# Patient Record
Sex: Male | Born: 1950 | Race: White | Hispanic: No | Smoking: Current every day smoker
Health system: Southern US, Community
[De-identification: ages and names within clinical notes are randomized; demographics above are authoritative.]

## PROBLEM LIST (undated history)

## (undated) DIAGNOSIS — Z5900 Homelessness unspecified: Secondary | ICD-10-CM

## (undated) DIAGNOSIS — I639 Cerebral infarction, unspecified: Secondary | ICD-10-CM

## (undated) DIAGNOSIS — R569 Unspecified convulsions: Secondary | ICD-10-CM

## (undated) DIAGNOSIS — F329 Major depressive disorder, single episode, unspecified: Secondary | ICD-10-CM

## (undated) DIAGNOSIS — F1010 Alcohol abuse, uncomplicated: Secondary | ICD-10-CM

## (undated) DIAGNOSIS — I10 Essential (primary) hypertension: Secondary | ICD-10-CM

## (undated) DIAGNOSIS — F32A Depression, unspecified: Secondary | ICD-10-CM

## (undated) DIAGNOSIS — E7800 Pure hypercholesterolemia, unspecified: Secondary | ICD-10-CM

---

## 2000-01-19 ENCOUNTER — Emergency Department (HOSPITAL_COMMUNITY): Admission: EM | Admit: 2000-01-19 | Discharge: 2000-01-19 | Payer: Self-pay | Admitting: Emergency Medicine

## 2001-10-17 ENCOUNTER — Encounter: Payer: Self-pay | Admitting: Family Medicine

## 2001-10-17 ENCOUNTER — Encounter: Admission: RE | Admit: 2001-10-17 | Discharge: 2001-10-17 | Payer: Self-pay | Admitting: Family Medicine

## 2004-04-08 ENCOUNTER — Inpatient Hospital Stay (HOSPITAL_COMMUNITY): Admission: EM | Admit: 2004-04-08 | Discharge: 2004-04-12 | Payer: Self-pay | Admitting: Emergency Medicine

## 2004-04-15 ENCOUNTER — Emergency Department (HOSPITAL_COMMUNITY): Admission: EM | Admit: 2004-04-15 | Discharge: 2004-04-15 | Payer: Self-pay | Admitting: Emergency Medicine

## 2006-04-16 ENCOUNTER — Emergency Department (HOSPITAL_COMMUNITY): Admission: EM | Admit: 2006-04-16 | Discharge: 2006-04-16 | Payer: Self-pay | Admitting: Family Medicine

## 2006-06-16 ENCOUNTER — Emergency Department (HOSPITAL_COMMUNITY): Admission: EM | Admit: 2006-06-16 | Discharge: 2006-06-16 | Payer: Self-pay | Admitting: Emergency Medicine

## 2008-07-05 ENCOUNTER — Ambulatory Visit: Payer: Self-pay | Admitting: Internal Medicine

## 2008-07-07 ENCOUNTER — Ambulatory Visit: Payer: Self-pay | Admitting: *Deleted

## 2008-07-15 ENCOUNTER — Encounter: Payer: Self-pay | Admitting: Internal Medicine

## 2008-07-15 ENCOUNTER — Ambulatory Visit: Payer: Self-pay | Admitting: Internal Medicine

## 2008-07-15 DIAGNOSIS — J019 Acute sinusitis, unspecified: Secondary | ICD-10-CM | POA: Insufficient documentation

## 2008-08-11 ENCOUNTER — Emergency Department (HOSPITAL_COMMUNITY): Admission: EM | Admit: 2008-08-11 | Discharge: 2008-08-12 | Payer: Self-pay | Admitting: Emergency Medicine

## 2008-11-19 ENCOUNTER — Telehealth (INDEPENDENT_AMBULATORY_CARE_PROVIDER_SITE_OTHER): Payer: Self-pay | Admitting: *Deleted

## 2008-11-19 ENCOUNTER — Encounter (INDEPENDENT_AMBULATORY_CARE_PROVIDER_SITE_OTHER): Payer: Self-pay | Admitting: Adult Health

## 2008-11-19 ENCOUNTER — Ambulatory Visit: Payer: Self-pay | Admitting: Internal Medicine

## 2008-11-19 LAB — CONVERTED CEMR LAB
ALT: 10 U/L
AST: 11 U/L
Albumin: 4.3 g/dL
Alkaline Phosphatase: 69 U/L
BUN: 11 mg/dL
Basophils Absolute: 0 K/uL
Basophils Relative: 0 %
CO2: 26 meq/L
Calcium: 8.8 mg/dL
Chloride: 104 meq/L
Creatinine, Ser: 0.88 mg/dL
Eosinophils Absolute: 0.3 K/uL
Eosinophils Relative: 6 % — ABNORMAL HIGH
Glucose, Bld: 86 mg/dL
HCT: 41.2 %
Hemoglobin: 13.5 g/dL
Lymphocytes Relative: 31 %
Lymphs Abs: 1.7 K/uL
MCHC: 32.8 g/dL
MCV: 96.3 fL
Monocytes Absolute: 0.5 K/uL
Monocytes Relative: 9 %
Neutro Abs: 3 K/uL
Neutrophils Relative %: 54 %
Platelets: 260 K/uL
Potassium: 4.4 meq/L
RBC: 4.28 M/uL
RDW: 14.4 %
Sodium: 142 meq/L
Total Bilirubin: 0.2 mg/dL — ABNORMAL LOW
Total Protein: 7 g/dL
WBC: 5.5 10*3/microliter

## 2008-11-20 ENCOUNTER — Encounter (INDEPENDENT_AMBULATORY_CARE_PROVIDER_SITE_OTHER): Payer: Self-pay | Admitting: Adult Health

## 2008-11-26 ENCOUNTER — Ambulatory Visit: Payer: Self-pay | Admitting: Internal Medicine

## 2008-11-30 ENCOUNTER — Ambulatory Visit: Payer: Self-pay | Admitting: Internal Medicine

## 2009-10-15 ENCOUNTER — Emergency Department (HOSPITAL_COMMUNITY): Admission: EM | Admit: 2009-10-15 | Discharge: 2009-10-15 | Payer: Self-pay | Admitting: Emergency Medicine

## 2009-12-30 ENCOUNTER — Ambulatory Visit: Payer: Self-pay | Admitting: Internal Medicine

## 2009-12-30 ENCOUNTER — Inpatient Hospital Stay (HOSPITAL_COMMUNITY): Admission: EM | Admit: 2009-12-30 | Discharge: 2010-01-09 | Payer: Self-pay | Admitting: Emergency Medicine

## 2010-01-06 ENCOUNTER — Encounter: Payer: Self-pay | Admitting: Internal Medicine

## 2010-01-09 DIAGNOSIS — I10 Essential (primary) hypertension: Secondary | ICD-10-CM | POA: Insufficient documentation

## 2010-01-09 DIAGNOSIS — I6350 Cerebral infarction due to unspecified occlusion or stenosis of unspecified cerebral artery: Secondary | ICD-10-CM | POA: Insufficient documentation

## 2010-02-01 ENCOUNTER — Encounter (INDEPENDENT_AMBULATORY_CARE_PROVIDER_SITE_OTHER): Payer: Self-pay | Admitting: *Deleted

## 2010-02-01 LAB — CONVERTED CEMR LAB
AST: 15 units/L (ref 0–37)
Alkaline Phosphatase: 70 units/L (ref 39–117)
BUN: 13 mg/dL (ref 6–23)
Creatinine, Ser: 0.82 mg/dL (ref 0.40–1.50)

## 2010-04-13 ENCOUNTER — Emergency Department (HOSPITAL_COMMUNITY)
Admission: EM | Admit: 2010-04-13 | Discharge: 2010-04-14 | Payer: Self-pay | Source: Home / Self Care | Admitting: Emergency Medicine

## 2010-04-13 LAB — CBC
MCV: 96.2 fL (ref 78.0–100.0)
Platelets: 246 10*3/uL (ref 150–400)
RDW: 13.1 % (ref 11.5–15.5)

## 2010-04-13 LAB — BASIC METABOLIC PANEL
Calcium: 8.6 mg/dL (ref 8.4–10.5)
GFR calc non Af Amer: >60 mL/min (ref >60)
Glucose, Bld: 108 mg/dL — ABNORMAL HIGH (ref 70–99)
Sodium: 140 mEq/L (ref 135–145)

## 2010-04-13 LAB — DIFFERENTIAL
Lymphocytes Relative: 18 % (ref 12–46)
Monocytes Absolute: 0.5 10*3/uL (ref 0.1–1.0)
Monocytes Relative: 5 % (ref 3–12)
Neutro Abs: 7.2 10*3/uL (ref 1.7–7.7)
Neutrophils Relative %: 75 % (ref 43–77)

## 2010-04-13 LAB — ETHANOL: Alcohol, Ethyl (B): 250 mg/dL — ABNORMAL HIGH (ref 0–10)

## 2010-04-18 NOTE — Discharge Summary (Signed)
Summary: Hospital Discharge Update    Hospital Discharge Update:  Date of Admission: 12/30/2009 Date of Discharge: 01/09/2010  Brief Summary:  Mr. [name] [name] is a 60 year old gentleman that was admitted for symptoms of right sided numbness, along with difficulty walking. Patient was found to have a left thalamic stroke per MRI report. MRI report reads as follows: Acute lacunar infarct in the left cerebral white matter tracking through the lateral thalamus near the posterior limb of the left internal capsule.  No mass effect or hemorrhage.Underlying chronic small vessel ischemia. Patient was risk stratified with Plavix, Zocor for lipid control, and Norvasc for HTN. Patient was offered Aspirin as an alternative to Plavix, but he declined stating Plavix is superior to Aspirin. Physical therapy was also evaluating patient throughout hospitalization. He has residual fine motor skill weakness, but has shown significant improvement since admission.  Other follow-up issues:  Financial concerns with medication assistance. Patient was provided two coupons for Plavix, which will give him 28 day supply. Patient would like Norvasc for blood pressure and understands that it is not on the 4-dollar formulary.  Problem list changes:  Added new problem of CVA (ICD-434.91) Added new problem of INADEQUATE MATERIAL RESOURCES (ICD-V60.2) Added new problem of HYPERTENSION (ICD-401.9)  Medication list changes:  Removed medication of AUGMENTIN 875-125 MG TABS (AMOXICILLIN-POT CLAVULANATE) Take 1 tablet by mouth two times a day Added new medication of AMLODIPINE BESYLATE 5 MG TABS (AMLODIPINE BESYLATE) Take 1 tablet by mouth once a day - Signed Added new medication of PLAVIX 75 MG TABS (CLOPIDOGREL BISULFATE) Take 1 tablet by mouth once a day - Signed Added new medication of SIMVASTATIN 5 MG TABS (SIMVASTATIN) Take 1 tablet by mouth once a day - Signed Rx of AMLODIPINE BESYLATE 5 MG TABS (AMLODIPINE BESYLATE) Take 1  tablet by mouth once a day;  #3 x 0;  Signed;  Entered by: Melida Quitter MD;  Authorized by: Leodis Sias MD;  Method used: Print then Give to Patient Rx of PLAVIX 75 MG TABS (CLOPIDOGREL BISULFATE) Take 1 tablet by mouth once a day;  #14 x 0;  Signed;  Entered by: Melida Quitter MD;  Authorized by: Leodis Sias MD;  Method used: Print then Give to Patient Rx of SIMVASTATIN 5 MG TABS (SIMVASTATIN) Take 1 tablet by mouth once a day;  #3 x 0;  Signed;  Entered by: Melida Quitter MD;  Authorized by: Leodis Sias MD;  Method used: Print then Give to Patient Rx of AMLODIPINE BESYLATE 5 MG TABS (AMLODIPINE BESYLATE) Take 1 tablet by mouth once a day;  #31 x 3;  Signed;  Entered by: Melida Quitter MD;  Authorized by: Leodis Sias MD;  Method used: Print then Give to Patient Rx of PLAVIX 75 MG TABS (CLOPIDOGREL BISULFATE) Take 1 tablet by mouth once a day;  #14 x 0;  Signed;  Entered by: Melida Quitter MD;  Authorized by: Leodis Sias MD;  Method used: Print then Give to Patient Rx of SIMVASTATIN 5 MG TABS (SIMVASTATIN) Take 1 tablet by mouth once a day;  #31 x 3;  Signed;  Entered by: Melida Quitter MD;  Authorized by: Leodis Sias MD;  Method used: Print then Give to Patient Rx of PLAVIX 75 MG TABS (CLOPIDOGREL BISULFATE) Take 1 tablet by mouth once a day;  #31 x 3;  Signed;  Entered by: Melida Quitter MD;  Authorized by: Leodis Sias MD;  Method used: Print then Give to Patient  The medication, problem, and allergy lists have  been updated.  Please see the dictated discharge summary for details.  Discharge medications:  AMLODIPINE BESYLATE 5 MG TABS (AMLODIPINE BESYLATE) Take 1 tablet by mouth once a day PLAVIX 75 MG TABS (CLOPIDOGREL BISULFATE) Take 1 tablet by mouth once a day SIMVASTATIN 5 MG TABS (SIMVASTATIN) Take 1 tablet by mouth once a day  Other patient instructions:  Please follow up with [facility] on November 16th, 2011 at 8 am. Please [facility] sooner to get the orange card set-up.  Please take all your medications as directed. You have been started on three new medications. Plavix is for further prevention of stroke, Norvasc (Amlodipine) is for blood pressure and Zocor (Simvastatin) is for cholesterol.  Please continue to follow a low sodium diet, which means avoid fried foods, and eat more vegetables and fruit as snacks.  If you have any further questions, please contact the clinic.   Note: Hospital Discharge Medications & Other Instructions handout was printed, one copy for patient and a second copy to be placed in hospital chart.    Complete Medication List: 1)  Amlodipine Besylate 5 Mg Tabs (Amlodipine besylate) .... Take 1 tablet by mouth once a day 2)  Plavix 75 Mg Tabs (Clopidogrel bisulfate) .... Take 1 tablet by mouth once a day 3)  Simvastatin 5 Mg Tabs (Simvastatin) .... Take 1 tablet by mouth once a day  Prescriptions: PLAVIX 75 MG TABS (CLOPIDOGREL BISULFATE) Take 1 tablet by mouth once a day  #31 x 3   Entered by:   Melida Quitter MD   Authorized by:   Leodis Sias MD   Signed by:   Melida Quitter MD on 01/09/2010   Method used:   Print then Give to Patient   RxID:   [id number] SIMVASTATIN 5 MG TABS (SIMVASTATIN) Take 1 tablet by mouth once a day  #31 x 3   Entered by:   Melida Quitter MD   Authorized by:   Leodis Sias MD   Signed by:   Melida Quitter MD on 01/09/2010   Method used:   Print then Give to Patient   RxID:   [id number] PLAVIX 75 MG TABS (CLOPIDOGREL BISULFATE) Take 1 tablet by mouth once a day  #14 x 0   Entered by:   Melida Quitter MD   Authorized by:   Leodis Sias MD   Signed by:   Melida Quitter MD on 01/09/2010   Method used:   Print then Give to Patient   RxID:   [id number] AMLODIPINE BESYLATE 5 MG TABS (AMLODIPINE BESYLATE) Take 1 tablet by mouth once a day  #31 x 3   Entered by:   Melida Quitter MD   Authorized by:    Leodis Sias MD   Signed by:   Melida Quitter MD on 01/09/2010   Method used:   Print then Give to Patient   RxID:   [id number] SIMVASTATIN 5 MG TABS (SIMVASTATIN) Take 1 tablet by mouth once a day  #3 x 0   Entered by:   Melida Quitter MD   Authorized by:   Leodis Sias MD   Signed by:   Melida Quitter MD on 01/09/2010   Method used:   Print then Give to Patient   RxID:   [id number] PLAVIX 75 MG TABS (CLOPIDOGREL BISULFATE) Take 1 tablet by mouth once a day  #14 x 0   Entered by:   Melida Quitter MD   Authorized by:   Leodis Sias MD  Signed by:   Melida Quitter MD on 01/09/2010   Method used:   Print then Give to Patient   RxID:   [id number] AMLODIPINE BESYLATE 5 MG TABS (AMLODIPINE BESYLATE) Take 1 tablet by mouth once a day  #3 x 0   Entered by:   Melida Quitter MD   Authorized by:   Leodis Sias MD   Signed by:   Melida Quitter MD on 01/09/2010   Method used:   Print then Give to Patient   RxID:   [id number]

## 2010-04-20 ENCOUNTER — Emergency Department (HOSPITAL_COMMUNITY)
Admission: EM | Admit: 2010-04-20 | Discharge: 2010-04-20 | Disposition: A | Discharge status: Home / Self Care | Payer: Self-pay | Attending: Emergency Medicine | Admitting: Emergency Medicine

## 2010-04-20 DIAGNOSIS — I10 Essential (primary) hypertension: Secondary | ICD-10-CM | POA: Insufficient documentation

## 2010-04-20 DIAGNOSIS — Z4802 Encounter for removal of sutures: Secondary | ICD-10-CM | POA: Insufficient documentation

## 2010-04-20 DIAGNOSIS — Z8673 Personal history of transient ischemic attack (TIA), and cerebral infarction without residual deficits: Secondary | ICD-10-CM | POA: Insufficient documentation

## 2010-04-20 DIAGNOSIS — E785 Hyperlipidemia, unspecified: Secondary | ICD-10-CM | POA: Insufficient documentation

## 2010-04-24 ENCOUNTER — Emergency Department (HOSPITAL_COMMUNITY)
Admission: EM | Admit: 2010-04-24 | Discharge: 2010-04-24 | Discharge status: Against Medical Advice | Payer: Self-pay | Attending: Emergency Medicine | Admitting: Emergency Medicine

## 2010-05-02 ENCOUNTER — Other Ambulatory Visit: Payer: Self-pay | Admitting: Family Medicine

## 2010-05-02 ENCOUNTER — Ambulatory Visit (HOSPITAL_COMMUNITY)
Admission: RE | Admit: 2010-05-02 | Discharge: 2010-05-02 | Disposition: A | Discharge status: Home / Self Care | Payer: Self-pay | Source: Ambulatory Visit | Attending: Family Medicine | Admitting: Family Medicine

## 2010-05-02 DIAGNOSIS — M47812 Spondylosis without myelopathy or radiculopathy, cervical region: Secondary | ICD-10-CM | POA: Insufficient documentation

## 2010-05-02 DIAGNOSIS — M542 Cervicalgia: Secondary | ICD-10-CM | POA: Insufficient documentation

## 2010-05-02 DIAGNOSIS — R52 Pain, unspecified: Secondary | ICD-10-CM

## 2010-05-11 ENCOUNTER — Emergency Department (HOSPITAL_COMMUNITY): Payer: Self-pay

## 2010-05-11 ENCOUNTER — Emergency Department (HOSPITAL_COMMUNITY)
Admission: EM | Admit: 2010-05-11 | Discharge: 2010-05-11 | Disposition: A | Discharge status: Home / Self Care | Payer: Self-pay | Attending: Emergency Medicine | Admitting: Emergency Medicine

## 2010-05-11 DIAGNOSIS — T148XXA Other injury of unspecified body region, initial encounter: Secondary | ICD-10-CM | POA: Insufficient documentation

## 2010-05-11 DIAGNOSIS — Y9229 Other specified public building as the place of occurrence of the external cause: Secondary | ICD-10-CM | POA: Insufficient documentation

## 2010-05-11 DIAGNOSIS — G319 Degenerative disease of nervous system, unspecified: Secondary | ICD-10-CM | POA: Insufficient documentation

## 2010-05-11 DIAGNOSIS — I6789 Other cerebrovascular disease: Secondary | ICD-10-CM | POA: Insufficient documentation

## 2010-05-11 DIAGNOSIS — W19XXXA Unspecified fall, initial encounter: Secondary | ICD-10-CM | POA: Insufficient documentation

## 2010-05-11 DIAGNOSIS — M47812 Spondylosis without myelopathy or radiculopathy, cervical region: Secondary | ICD-10-CM | POA: Insufficient documentation

## 2010-05-31 LAB — CBC
HCT: 41.6 % (ref 39.0–52.0)
MCH: 34.1 pg — ABNORMAL HIGH (ref 26.0–34.0)
MCHC: 33.7 g/dL (ref 30.0–36.0)
MCV: 100 fL (ref 78.0–100.0)
MCV: 101.2 fL — ABNORMAL HIGH (ref 78.0–100.0)
Platelets: 221 10*3/uL (ref 150–400)
RBC: 4.34 MIL/uL (ref 4.22–5.81)
RDW: 13.8 % (ref 11.5–15.5)
WBC: 7.1 10*3/uL (ref 4.0–10.5)

## 2010-05-31 LAB — BASIC METABOLIC PANEL
BUN: 14 mg/dL (ref 6–23)
BUN: 15 mg/dL (ref 6–23)
CO2: 28 mEq/L (ref 19–32)
Calcium: 9.2 mg/dL (ref 8.4–10.5)
Calcium: 9.2 mg/dL (ref 8.4–10.5)
Chloride: 103 mEq/L (ref 96–112)
Chloride: 104 mEq/L (ref 96–112)
Creatinine, Ser: 0.95 mg/dL (ref 0.4–1.5)
GFR calc Af Amer: >60 mL/min (ref >60)
GFR calc Af Amer: >60 mL/min (ref >60)
GFR calc non Af Amer: >60 mL/min (ref >60)
GFR calc non Af Amer: >60 mL/min (ref >60)
Glucose, Bld: 91 mg/dL (ref 70–99)
Glucose, Bld: 92 mg/dL (ref 70–99)
Potassium: 3.8 mEq/L (ref 3.5–5.1)
Potassium: 4.2 mEq/L (ref 3.5–5.1)
Sodium: 141 mEq/L (ref 135–145)

## 2010-05-31 LAB — TSH: TSH: 2.467 u[IU]/mL (ref 0.350–4.500)

## 2010-05-31 LAB — LIPID PANEL
LDL Cholesterol: 90 mg/dL (ref 0–99)
Total CHOL/HDL Ratio: 3.3 RATIO
VLDL: 16 mg/dL (ref 0–40)

## 2010-06-01 LAB — COMPREHENSIVE METABOLIC PANEL
ALT: 11 U/L (ref 0–53)
AST: 15 U/L (ref 0–37)
Albumin: 3.5 g/dL (ref 3.5–5.2)
Alkaline Phosphatase: 54 U/L (ref 39–117)
GFR calc Af Amer: >60 mL/min (ref >60)
Glucose, Bld: 141 mg/dL — ABNORMAL HIGH (ref 70–99)
Potassium: 3.3 mEq/L — ABNORMAL LOW (ref 3.5–5.1)
Sodium: 140 mEq/L (ref 135–145)
Total Protein: 6.2 g/dL (ref 6.0–8.3)

## 2010-06-01 LAB — CBC
HCT: 42.5 % (ref 39.0–52.0)
Hemoglobin: 14.1 g/dL (ref 13.0–17.0)
MCV: 98.8 fL (ref 78.0–100.0)
RBC: 4.3 MIL/uL (ref 4.22–5.81)
WBC: 6.9 10*3/uL (ref 4.0–10.5)

## 2010-06-01 LAB — URINE DRUGS OF ABUSE SCREEN W ALC, ROUTINE (REF LAB)
Benzodiazepines.: NEGATIVE
Marijuana Metabolite: NEGATIVE
Methadone: NEGATIVE
Opiate Screen, Urine: NEGATIVE
Propoxyphene: NEGATIVE

## 2010-06-01 LAB — RAPID URINE DRUG SCREEN, HOSP PERFORMED
Amphetamines: NOT DETECTED
Barbiturates: NOT DETECTED
Benzodiazepines: NOT DETECTED
Opiates: NOT DETECTED

## 2010-06-01 LAB — DIFFERENTIAL
Eosinophils Relative: 3 % (ref 0–5)
Lymphocytes Relative: 16 % (ref 12–46)
Lymphs Abs: 1.1 10*3/uL (ref 0.7–4.0)
Monocytes Absolute: 0.5 10*3/uL (ref 0.1–1.0)

## 2010-06-01 LAB — BASIC METABOLIC PANEL
Chloride: 107 mEq/L (ref 96–112)
GFR calc Af Amer: >60 mL/min (ref >60)
Potassium: 4.3 mEq/L (ref 3.5–5.1)

## 2010-06-01 LAB — CARDIAC PANEL(CRET KIN+CKTOT+MB+TROPI): Total CK: 55 U/L (ref 7–232)

## 2010-06-01 LAB — PROTIME-INR: Prothrombin Time: 13 seconds (ref 11.6–15.2)

## 2010-06-01 LAB — HEMOGLOBIN A1C: Hgb A1c MFr Bld: 5.6 % (ref <5.7)

## 2010-06-03 LAB — POCT I-STAT, CHEM 8
BUN: 10 mg/dL (ref 6–23)
Calcium, Ion: 1.03 mmol/L — ABNORMAL LOW (ref 1.12–1.32)
Chloride: 105 mEq/L (ref 96–112)
Creatinine, Ser: 1.1 mg/dL (ref 0.4–1.5)
Glucose, Bld: 87 mg/dL (ref 70–99)
Potassium: 3.8 mEq/L (ref 3.5–5.1)

## 2010-08-04 NOTE — H&P (Signed)
NAMENATANEL, SNAVELY         ACCOUNT NO.:  [id number]   MEDICAL RECORD NO.:  [id number]          PATIENT TYPE:  EMS   LOCATION:  ED                           [facility]AC[facility]  [facility]   PHYSICIAN:  Hettie Holstein, D.O.    DATE OF BIRTH:  1950/12/13   DATE OF ADMISSION:  04/07/2004  DATE OF DISCHARGE:                                HISTORY & PHYSICAL   This is a 23-hour observation note.  Patient is unassigned.   CHIEF COMPLAINT:  Could not walk.  Left leg weakness.   HISTORY OF PRESENT ILLNESS:  This is a 60 year old alcoholic Caucasian male  who reports he is now homeless and presents with acute lower extremity  weakness.  Sustained a laceration to his left foot today while he fell.  In  the emergency department, he was found to be ataxic.  He reported a recent  fall several days ago and had been seen by [employer].  He feels  that his problems may be related to this.  He had been drinking today.  His  alcohol level was 294.  He drinks about a case a beer over the course of  three days.  He states that he is being evicted from the place he is  currently residing.  He lives with his sister and mother.  His living  situation seems to be pretty inconsistent, I am not certain; however, he  does deny being in a homeless shelter or depending on a homeless shelter in  the past.  He has in the past gone through [facility] in [facility], and his  sister-in-law has tried to encourage him, once again, to go into a detox  program.  He is currently considering this.  In any event, in the emergency  department he is noted to have a laceration on the plantar surface on his  left foot, fifth digit.  Currently, we are awaiting a wound/laceration  repair to be completed before transfer upstairs.   PAST MEDICAL HISTORY:  He said he sustained a fall several weeks ago and had  been seen at [facility] [facility].  He has no prior known diagnosis of  hypertension; however, he stated at that time,  the PA at [facility] told him that he should have his blood pressure rechecked and  see a regular physician.  He said that his blood pressure was relieved at  that time.  No other prior medical history except for tobacco dependence and  longstanding alcohol abuse.   He has no prior surgical history.   MEDICATIONS:  He takes no regular medications at home.   He has no known drug allergies.   SOCIAL HISTORY:  Patient used to commute to [facility].  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  In any event, he is an  alcoholic.  The longest period of time that he completely stopped drinking  was about four days in a row.  He denies a prior history of DT's or shakes.  He denies any other illegal drug use.  Currently, he states that he is being  evicted from his apartment.  He is a half-pack-per-day smoker for about 20-  30 years.  He completed high school and attended 1-1/2 years education at  [employer] but quit for unknown reasons.   FAMILY HISTORY:  Mother is alive at age 60.  Had an MI at age 64.  Father's  medical history:  His father died at age 15, had a CVA.  He has two brothers  and two sisters with positive diabetes in the family.   REVIEW OF SYSTEMS:  Patient denies any nausea, vomiting, diarrhea, shortness  of breath, chest pain, abdominal pain, dysuria, hematochezia, melena.  He is  unsure of his immunization status.  In addition, he has undergone  colonoscopic screening.   PHYSICAL EXAMINATION:  VITAL SIGNS:  Blood pressure 154/89, heart rate 91,  respirations 18, temp 99.7.  O2 98%.  HEENT:  Head is normocephalic and atraumatic.  Extraocular muscles are  intact.  There is no evidence of ophthalmoplegia or nystagmus.  There was no  conjunctival pallor.  Sclerae are anicteric.  Oral mucosa is pink and moist.  NECK:  Soft and nontender.  No palpable thyromegaly or mass.  LUNGS:  Clear to auscultation bilaterally.  CARDIOVASCULAR:  Normal S1 and  S2.  No S3 or S4.  ABDOMEN:  Soft and nontender.  No guarding or rebound.  No suprapubic or  costovertebral angle tenderness.  NEUROLOGIC:  Ataxia with finger-to-nose as well as heel-shin on both sides.  Otherwise, there are no other focal neurological deficits.  EXTREMITIES:  There was a laceration noted of about 2-3 cm with dried blood  on the plantar surface of his left foot, just below his fifth digit.  X-ray  is being performed to rule out foreign body.   LABORATORY DATA:  Urine drug screen was negative.  Urinalysis was negative  as well.  WBC 4.0, hemoglobin 15, platelet count 207, MCV 103.  Alcohol  level 294.  Sodium 134, potassium 4.1, BUN/ creatinine 0.7, glucose 91, CO2  25.  HDL/ALT 101/41.   Plain film of his foot is currently pending.  Rule out foreign body.   IMPRESSION:  1.  Ataxia.  2.  Alcoholism.  3.  Homelessness.  4.  Tobacco dependence.  5.  Foot laceration.   PLAN:  We are going to prophylactically administer thiamine and folate for  the possibility of any nutritional polyneuropathy and administer alcohol-  withdrawal protocol.  Check an x-ray of his foot to rule out foreign body  and await lac repair, per [facility] emergency department, and will ask the  assistance of Social Services and substance abuse counseling service for  possible outpatient detox.     Eric   ESS/MEDQ  D:  04/07/2004  T:  04/07/2004  Job:  [id number]

## 2010-08-04 NOTE — Discharge Summary (Signed)
[name] [name], [name] [name]         ACCOUNT NO.:  [id number]   MEDICAL RECORD NO.:  [id number]          PATIENT TYPE:  INP   LOCATION:  0372                         FACILITY:  [facility]   PHYSICIAN:  Michaelyn Barter, M.D. DATE OF BIRTH:  February 24, 1951   DATE OF ADMISSION:  04/07/2004  DATE OF DISCHARGE:  04/12/2004                                 DISCHARGE SUMMARY   PRIMARY CARE PHYSICIAN:  Unassigned.   DISCHARGE DIAGNOSES:  1.  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  2.  Left foot laceration.  3.  Alcoholic hepatitis.  4.  Questionable avulsion at the dorsal aspect of the tarsal navicular.   CONSULTATIONS:  Psychiatry, [name], M.D.   PROCEDURES:  1.  Head CT without contrast.  2.  Left foot x-ray.   HISTORY OF PRESENT ILLNESS:  [name] [name] is a 60 year old gentleman with a  history of alcohol abuse who arrived in the emergency room with the chief  complaint that he could not walk and that his left leg was weak.  He went on  to state that he sustained a laceration to his left foot earlier that day  during his fall.  While being examined in the ER, he was found to be ataxic  and had an alcohol level of 294.  He went on to state that he fell several  days earlier and was examined by [employer].  He gave a story that  demonstrated his home environment/living situation was inconsistent.  While  in the emergency room, he was noted to have a laceration on the plantar  surface of his left foot, the fifth digit which was subsequently repaired.   PAST MEDICAL HISTORY:  History of alcohol abuse. The patient had been in the  [facility] back in 1981.   No other past significant medical history.   SOCIAL HISTORY:  Alcohol:  The patient has a history of alcoholism.  At the  time of admission, he stated that the longest period of time of sobriety for  him has been four days.  He denied any prior DTs.  Cigarettes:  He smokes  one-half pack of cigarettes per day and has been  doing so for approximately  20 to 30 years.   FAMILY HISTORY:  Mother had an MI at the age of 43.  Father died at the age  of 75 from a CVA.   HOSPITAL COURSE:  #1.  ATAXIA:  The patient underwent a CT scan of the head to assure that  there was no acute intracranial event.  The radiologist's final impression  was that there was no intracranial hemorrhage, hydrocephalus, or acute  intracranial findings.  He stated that there was mild chronic ethmoid  sinusitis.  There were no additional findings.  It was believed that the  ataxia the patient demonstrated was secondary to acute on chronic  consumption of alcohol.  His gait improved over the course of this  hospitalization.  Physical therapy was consulted, and he was able to  ambulate under their supervision. Currently he is able to ambulate fine  without any supervision or assistance.   #2.  LEFT FOOT LACERATION:  The patient has not complained of any problems  with his foot over the course of hospitalization.  He does currently have  stitches present, and the foot/toe look clean.  There is no sign of  infection.   #3.  ALCOHOLIC HEPATITIS:  The patient has not reported any abdominal  complaints over the course of his hospitalization.  Likewise, his vital  signs have been stable over the course of his hospitalization.  He has been  afebrile throughout his hospitalization.  Therefore, no further intervention  has been undertaken regarding this.  The patient will follow up with his  primary care physician for further evaluation.   #4.  QUESTIONABLE EMOTIONAL ABNORMALITY:  The patient demonstrated symptoms  of depression at times during the course of his hospitalization.  He also  appeared to be stressed at times secondary to his home situation which is  somewhat vague.  He has reported that he and his landlord are at odds.  His  actual statement was that his landlord hated his guts.  There is a question  as to whether or not he will be  evicted sometime in the near future.  Psychiatry was subsequently consulted regarding his emotional state.  Dr.  Jeanie [name] was the physician who examined the patient.  His final assessment  of the patient was that his judgment was within normal limits.  He  recommended that we continue vitamins, continue Librium taper off.  The  patient declined any inpatient psychiatric hospitalization.   #5.  QUESTIONABLE HOMELESSNESS:  The patient was offered information  regarding shelters for the homeless and other possible options for  disposition.  Care management worked very closely with regards to giving the  patient options for disposition from this hospitalization.  However, the  patient refused all the options that were provided to him for disposition.  The patient will be discharged home today.   CONDITION ON DISCHARGE:  Significantly improved.  Again, his gait has  improved, and the patient is able to ambulate without assistance and without  supervision.   He will be discharged home on the following medications.  1.  Folic acid 1 mg daily.  2.  Multivitamins 1 tablet daily.  3.  Thiamine 100 mg 1 tablet daily.   1.  He will be instructed to follow up with [facility] for      evaluation of his leg.  They can also remove the stitches from his left      toe.  2.  In addition, he is instructed to avoid drinking alcohol.  3.  He is instructed to see his primary care physician or [facility]      doctor within 30 days for followup.  4.  During this hospitalization, the patient also had an x-ray of his left      foot.  The final impression was questionable avulsion of the dorsal      aspect of the tarsal navicular seen only on the lateral view.  He also      has and anterior inferior calcaneal spur.  5.  The patient will also be discharged home on Augmentin 875 mg 1 tablet      p.o. b.i.d.     OR/MEDQ  D:  04/12/2004  T:  04/12/2004  Job:  [id number]

## 2011-02-06 ENCOUNTER — Encounter: Payer: Self-pay | Admitting: Emergency Medicine

## 2011-02-06 ENCOUNTER — Other Ambulatory Visit: Payer: Self-pay

## 2011-02-06 ENCOUNTER — Emergency Department (HOSPITAL_COMMUNITY)
Admission: EM | Admit: 2011-02-06 | Discharge: 2011-02-07 | Disposition: A | Discharge status: Home / Self Care | Payer: Self-pay | Attending: Emergency Medicine | Admitting: Emergency Medicine

## 2011-02-06 DIAGNOSIS — E785 Hyperlipidemia, unspecified: Secondary | ICD-10-CM | POA: Insufficient documentation

## 2011-02-06 DIAGNOSIS — Z8673 Personal history of transient ischemic attack (TIA), and cerebral infarction without residual deficits: Secondary | ICD-10-CM | POA: Insufficient documentation

## 2011-02-06 DIAGNOSIS — IMO0002 Reserved for concepts with insufficient information to code with codable children: Secondary | ICD-10-CM

## 2011-02-06 DIAGNOSIS — Z79899 Other long term (current) drug therapy: Secondary | ICD-10-CM | POA: Insufficient documentation

## 2011-02-06 DIAGNOSIS — I10 Essential (primary) hypertension: Secondary | ICD-10-CM | POA: Insufficient documentation

## 2011-02-06 HISTORY — DX: Essential (primary) hypertension: I10

## 2011-02-06 HISTORY — DX: Cerebral infarction, unspecified: I63.9

## 2011-02-06 HISTORY — DX: Pure hypercholesterolemia, unspecified: E78.00

## 2011-02-06 LAB — CBC
HCT: 42.2 % (ref 39.0–52.0)
Hemoglobin: 14.1 g/dL (ref 13.0–17.0)
MCH: 33.5 pg (ref 26.0–34.0)
MCHC: 33.4 g/dL (ref 30.0–36.0)
MCV: 100.2 fL — ABNORMAL HIGH (ref 78.0–100.0)
Platelets: 284 10*3/uL (ref 150–400)
RBC: 4.21 MIL/uL — ABNORMAL LOW (ref 4.22–5.81)
RDW: 13.7 % (ref 11.5–15.5)
WBC: 6.6 10*3/uL (ref 4.0–10.5)

## 2011-02-06 LAB — COMPREHENSIVE METABOLIC PANEL
ALT: 15 U/L (ref 0–53)
AST: 27 U/L (ref 0–37)
Albumin: 3.9 g/dL (ref 3.5–5.2)
Alkaline Phosphatase: 73 U/L (ref 39–117)
BUN: 16 mg/dL (ref 6–23)
CO2: 26 mEq/L (ref 19–32)
Calcium: 8.7 mg/dL (ref 8.4–10.5)
Chloride: 106 mEq/L (ref 96–112)
Creatinine, Ser: 0.75 mg/dL (ref 0.50–1.35)
GFR calc Af Amer: >90 mL/min (ref >90)
GFR calc non Af Amer: >90 mL/min (ref >90)
Glucose, Bld: 81 mg/dL (ref 70–99)
Potassium: 4.1 mEq/L (ref 3.5–5.1)
Sodium: 143 mEq/L (ref 135–145)
Total Bilirubin: 0.1 mg/dL — ABNORMAL LOW (ref 0.3–1.2)
Total Protein: 7.3 g/dL (ref 6.0–8.3)

## 2011-02-06 LAB — URINALYSIS, ROUTINE W REFLEX MICROSCOPIC
Bilirubin Urine: NEGATIVE
Glucose, UA: NEGATIVE mg/dL
Hgb urine dipstick: NEGATIVE
Ketones, ur: NEGATIVE mg/dL
Leukocytes, UA: NEGATIVE
Nitrite: NEGATIVE
Protein, ur: NEGATIVE mg/dL
Specific Gravity, Urine: 1.018 (ref 1.005–1.030)
Urobilinogen, UA: 0.2 mg/dL (ref 0.0–1.0)
pH: 5.5 (ref 5.0–8.0)

## 2011-02-06 LAB — SALICYLATE LEVEL: Salicylate Lvl: <2 mg/dL — ABNORMAL LOW (ref 2.8–20.0)

## 2011-02-06 LAB — ACETAMINOPHEN LEVEL: Acetaminophen (Tylenol), Serum: <15 ug/mL (ref 10–30)

## 2011-02-06 LAB — ETHANOL: Alcohol, Ethyl (B): 321 mg/dL — ABNORMAL HIGH (ref 0–11)

## 2011-02-06 MED ORDER — SODIUM CHLORIDE 0.9 % IV BOLUS (SEPSIS)
1000.0000 mL | Freq: Once | INTRAVENOUS | Status: AC
  Administered 2011-02-06: 1000 mL via INTRAVENOUS

## 2011-02-06 NOTE — ED Notes (Addendum)
Chief Complaint  Patient presents with  . Alcohol Intoxication    Pt to ED with [facility]. Pt states that he drank at least 2 bottles of mouthwash. Per EMS received call from local PD and pt couldn't stand and PD ask for pt to transported to ER. Pt states he drank to wouthwash b/c it was cheaper to get drunk on  Pt denies SI/HI.  RN has removed 2 bottles of mouthwash from pt care and placed them at nursing station

## 2011-02-06 NOTE — ED Notes (Signed)
Chief Complaint  Patient presents with  . Alcohol Intoxication    Pt to ED with [facility]. Pt states that he drank at least 2 bottles of mouthwash. Per EMS received call from local PD and pt couldn't stand and PD ask for pt to transported to ER. Pt states he drank to wouthwash b/c it was cheaper to get drunk on  Pt resting on stretcher.  Pt denies Si/HI. Pt resting on stretcher. Pt awaits eval

## 2011-02-06 NOTE — ED Notes (Signed)
ZOX:[id number]<AV> Expected date:02/06/11<BR> Expected time: 9:30 PM<BR> Means of arrival:Ambulance<BR> Comments:<BR> EMS 36 Ptar - behavioral

## 2011-02-06 NOTE — ED Notes (Signed)
Pt sleeping on stretcher. Pt cont to await further dispo. Will cont to monitor

## 2011-02-07 LAB — OSMOLALITY: Osmolality: 372 mOsm/kg — ABNORMAL HIGH (ref 275–300)

## 2011-02-07 NOTE — ED Notes (Signed)
Pt sleeping on stretcher. Pt easily awaken to tocuh stimuli. Pt denies any needs or commands at this time. Will cont to monitor

## 2011-02-07 NOTE — ED Provider Notes (Signed)
History    60yM brought in by EMS after being found laying on ground. Pt admits to drinks "2 pints of mouth wash." Pt chronic drinker of mouth wash. Drinks because "they can't charge me for an open container of mouth wash." Pt with no complaints. Thinks he just passed out. Reports hx of prior skull fx 2010 afetr fall when intoxicated. Denies pain anywhere. No n/v. No acute visual changes. No numbness, tingling or loss of strength. Denies co-ingestion.   CSN: [id number] Arrival date & time: 02/06/2011  9:46 PM   First MD Initiated Contact with Patient 02/06/11 2205      Chief Complaint  Patient presents with  . Alcohol Intoxication    Pt to ED with [facility]. Pt states that he drank at least 2 bottles of mouthwash. Per EMS received call from local PD and pt couldn't stand and PD ask for pt to transported to ER. Pt states he drank to wouthwash b/c it was cheaper to get drunk on    (Consider location/radiation/quality/duration/timing/severity/associated sxs/prior treatment) HPI  Past Medical History  Diagnosis Date  . Hypercholesteremia   . Stroke   . Hypertension     History reviewed. No pertinent past surgical history.  History reviewed. No pertinent family history.  History  Substance Use Topics  . Smoking status: Former Smoker    Types: Cigarettes  . Smokeless tobacco: Not on file  . Alcohol Use: Yes     drinks bottles of 1pint  mouthwash daily      Review of Systems  Unable. Pt intoxicated.  Allergies  Review of patient's allergies indicates no known allergies.  Home Medications   Current Outpatient Rx  Name Route Sig Dispense Refill  . AMLODIPINE BESYLATE 10 MG PO TABS Oral Take 10 mg by mouth daily.      . ATORVASTATIN CALCIUM 20 MG PO TABS Oral Take 20 mg by mouth daily.      Marland Kitchen CLOPIDOGREL BISULFATE 75 MG PO TABS Oral Take 75 mg by mouth daily.      Marland Kitchen LISINOPRIL 5 MG PO TABS Oral Take 5 mg by mouth daily.      Carma Leaven M PLUS PO TABS Oral Take 1 tablet by mouth  daily.        BP 115/60  Pulse 84  Temp(Src) 97.4 F (36.3 C) (Oral)  Resp 16  SpO2 96%  Physical Exam  Nursing note and vitals reviewed. Constitutional: He is oriented to person, place, and time. No distress.       Sitting up in bed eating [place] sandwich with mustard all over fingers. Munching on pretzels and eating fruit cups through out hx and exam.  HENT:  Head: Normocephalic and atraumatic.  Right Ear: External ear normal.  Left Ear: External ear normal.  Nose: Nose normal.  Mouth/Throat: No oropharyngeal exudate.  Eyes: Conjunctivae are normal. Pupils are equal, round, and reactive to light. Right eye exhibits no discharge. Left eye exhibits no discharge.  Neck: Normal range of motion. Neck supple.  Cardiovascular: Normal rate, regular rhythm and normal heart sounds.  Exam reveals no gallop and no friction rub.   No murmur heard. Pulmonary/Chest: Effort normal and breath sounds normal. No stridor. No respiratory distress.  Abdominal: Soft. He exhibits no distension. There is no tenderness.  Musculoskeletal: He exhibits no edema and no tenderness.       Moving all extremities. No external signs of acute trauma.  Neurological: He is alert and oriented to person, place, and time. No  cranial nerve deficit. Coordination abnormal.       Pt with shuffling gait and unsteady when attempted to ambulate.  Skin: Skin is warm and dry. No rash noted. He is not diaphoretic. No pallor.  Psychiatric: Thought content normal.       Pt clinically intoxicated but answering questions appropriately and joking around occasionally. AOx3.    ED Course  Procedures (including critical care time)  Labs Reviewed  CBC - Abnormal; Notable for the following:    RBC 4.21 (*)    MCV 100.2 (*)    All other components within normal limits  COMPREHENSIVE METABOLIC PANEL - Abnormal; Notable for the following:    Total Bilirubin 0.1 (*)    All other components within normal limits  SALICYLATE LEVEL -  Abnormal; Notable for the following:    Salicylate Lvl <2.0 (*)    All other components within normal limits  ETHANOL - Abnormal; Notable for the following:    Alcohol, Ethyl (B) 321 (*)    All other components within normal limits  URINALYSIS, ROUTINE W REFLEX MICROSCOPIC  ACETAMINOPHEN LEVEL  OSMOLALITY   No results found.  EKG:  Rhythm:normal sionus Rate: 77 AxisL normal Intervals: normal. poor baseline. ST segments: NS ST changes.     1. Intoxication       MDM  60yM with intentional ingestion of mouthwash. No SI. Pt apparently drinks in regularly. Clinically intoxicated but no acute distress otherwise. No external signs of trauma. Nonfocal exam aside from intoxication. Pt unsteady on feet which likely multifactorial. Pt states hx of stroke and some residual deficit RLE. Plan tox eval and DC when clinically sober. Do not feel neuro imaging needed at this time.        Raeford Razor, MD 02/12/11 [phone]

## 2011-02-07 NOTE — ED Notes (Signed)
Pt given discharge instructions and verbalizes understanding  

## 2011-02-07 NOTE — ED Notes (Signed)
Patient is resting comfortably. Pt cont to await further dispo. Will cont to monitor

## 2011-02-07 NOTE — ED Provider Notes (Signed)
  Physical Exam  BP 124/76  Pulse 84  Temp(Src) 97.5 F (36.4 C) (Oral)  Resp 17  SpO2 100%  Physical Exam Awake, alert and oriented ED Course  Procedures  MDM  Patient  is sitting up drinking a glass of water. He is alert and oriented. Patient states he is ready to go home.     Celene Kras, MD 02/07/11 [phone]

## 2011-04-09 ENCOUNTER — Emergency Department (HOSPITAL_COMMUNITY)
Admission: EM | Admit: 2011-04-09 | Discharge: 2011-04-10 | Disposition: A | Discharge status: Home / Self Care | Payer: Self-pay | Attending: Emergency Medicine | Admitting: Emergency Medicine

## 2011-04-09 ENCOUNTER — Encounter (HOSPITAL_COMMUNITY): Payer: Self-pay | Admitting: Emergency Medicine

## 2011-04-09 DIAGNOSIS — Z5900 Homelessness unspecified: Secondary | ICD-10-CM | POA: Insufficient documentation

## 2011-04-09 DIAGNOSIS — Z8673 Personal history of transient ischemic attack (TIA), and cerebral infarction without residual deficits: Secondary | ICD-10-CM | POA: Insufficient documentation

## 2011-04-09 DIAGNOSIS — I10 Essential (primary) hypertension: Secondary | ICD-10-CM | POA: Insufficient documentation

## 2011-04-09 DIAGNOSIS — E7800 Pure hypercholesterolemia, unspecified: Secondary | ICD-10-CM | POA: Insufficient documentation

## 2011-04-09 DIAGNOSIS — F17200 Nicotine dependence, unspecified, uncomplicated: Secondary | ICD-10-CM | POA: Insufficient documentation

## 2011-04-09 NOTE — ED Provider Notes (Signed)
History     CSN: [id number]  Arrival date & time 04/09/11  2130   First MD Initiated Contact with Patient 04/09/11 2157      Chief Complaint  Patient presents with  . Medical Clearance    (Consider location/radiation/quality/duration/timing/severity/associated sxs/prior treatment) HPI  Patient states he has been homeless and living in [place] since 2010. He states he normally sleeps at the [facility] at night however he's been drinking today and they won't let him stay if he is drinking. He states sometimes during the day he hangs out in [employer] however today [employer] was closed because of  holiday. He relates he was laying on the ground outside [employer] drinking and realized it was cold and thought he might die. He therefore presents emergency department as he does not have a place to stay. Patient denies having any physical problems today. Patient denies suicidal or homicidal ideation or feeling depressed. Patient states he drinks mouthwash because the police can't stop him from having an open container of mouthwash.  PCP none  Past Medical History  Diagnosis Date  . Hypercholesteremia   . Stroke   . Hypertension     History reviewed. No pertinent past surgical history.  History reviewed. No pertinent family history.  History  Substance Use Topics  . Smoking status: Current Everyday Smoker -- 0.2 packs/day    Types: Cigarettes  . Smokeless tobacco: Not on file  . Alcohol Use: Yes     drinks bottles of 1pint  mouthwash daily   homeless Patient states he has brothers and sisters in the area but he cannot stay with them    Review of Systems  All other systems reviewed and are negative.    Allergies  Review of patient's allergies indicates no known allergies.  Home Medications   Current Outpatient Rx  Name Route Sig Dispense Refill  . AMLODIPINE BESYLATE 10 MG PO TABS Oral Take 10 mg by mouth daily.      Marland Kitchen CLOPIDOGREL BISULFATE 75 MG PO TABS Oral Take 75  mg by mouth daily.      Marland Kitchen LISINOPRIL 5 MG PO TABS Oral Take 5 mg by mouth daily.      . ATORVASTATIN CALCIUM 20 MG PO TABS Oral Take 20 mg by mouth daily.        BP 141/86  Pulse 96  Temp(Src) 98.4 F (36.9 C) (Oral)  Resp 20  SpO2 99%  Vital signs normal    Physical Exam  Nursing note and vitals reviewed. Constitutional: He is oriented to person, place, and time. He appears well-developed and well-nourished.  Non-toxic appearance. He does not appear ill. No distress.       Pleasant cooperative  HENT:  Head: Normocephalic and atraumatic.  Right Ear: External ear normal.  Left Ear: External ear normal.  Nose: Nose normal. No mucosal edema or rhinorrhea.  Mouth/Throat: Oropharynx is clear and moist and mucous membranes are normal. No dental abscesses or uvula swelling.  Eyes: Conjunctivae and EOM are normal. Pupils are equal, round, and reactive to light.  Neck: Normal range of motion and full passive range of motion without pain. Neck supple.  Cardiovascular: Normal rate, regular rhythm and normal heart sounds.  Exam reveals no gallop and no friction rub.   No murmur heard. Pulmonary/Chest: Effort normal and breath sounds normal. No respiratory distress. He has no wheezes. He has no rhonchi. He has no rales. He exhibits no tenderness and no crepitus.  Abdominal: Soft. Normal appearance  and bowel sounds are normal. He exhibits no distension. There is no tenderness. There is no rebound and no guarding.  Musculoskeletal: Normal range of motion. He exhibits no edema and no tenderness.       Moves all extremities well.   Neurological: He is alert and oriented to person, place, and time. He has normal strength. No cranial nerve deficit.  Skin: Skin is warm, dry and intact. No rash noted. No erythema. No pallor.  Psychiatric: He has a normal mood and affect. His speech is normal and behavior is normal. His mood appears not anxious.    ED Course  Procedures (including critical care  time)  Patient was given taxi voucher and arrangements were made for [facility] in in the past.  1. Homeless    Plan discharge  Devoria Albe, MD, FACEP    MDM          Ward Givens, MD 04/09/11 [phone]

## 2011-04-09 NOTE — ED Notes (Signed)
CSW met with pt to review shelter options again. Pt states he wants to return to the [facility]. CSW reviewed with the pt the process of applying for Medicaid and Social Security Benefits so that he may look for group homes and low income housing. Pt states that he wants to wait for Medicaid for 2 more years when he can retire. Pt states he understands that Medicaid could help provide different living options though he is disinterested in applying at this time. Pt is adamant that he is returning to the [facility]. Due to the holiday, busses stopped running at 10pm. Pt was provided a cab voucher. No further CSW needs identified at this time.

## 2011-04-09 NOTE — ED Notes (Signed)
Pt states he is homeless and was laying outside in the cold and thought if he continued to lay there he would be dead  Pt states he has been homeless for 7 years  Pt brought in by EMS

## 2011-04-09 NOTE — ED Notes (Signed)
CSW met with pt to review discharge plans. CSW explained that pt was able to return to the [facility] for the evening. Pt became upset stating that he did not want to return to the [facility] that he wanted to go back to the [facility] where he has been living for over 2 years. CSW continuing to work on discharge plan.

## 2011-04-10 NOTE — ED Notes (Signed)
Pt sent to the [facility] via cab by voucher by social work

## 2011-11-20 ENCOUNTER — Emergency Department (HOSPITAL_COMMUNITY)
Admission: EM | Admit: 2011-11-20 | Discharge: 2011-11-21 | Disposition: A | Discharge status: Home / Self Care | Payer: Self-pay | Attending: Emergency Medicine | Admitting: Emergency Medicine

## 2011-11-20 ENCOUNTER — Encounter (HOSPITAL_COMMUNITY): Payer: Self-pay | Admitting: Emergency Medicine

## 2011-11-20 DIAGNOSIS — F1010 Alcohol abuse, uncomplicated: Secondary | ICD-10-CM | POA: Insufficient documentation

## 2011-11-20 DIAGNOSIS — Z5900 Homelessness unspecified: Secondary | ICD-10-CM | POA: Insufficient documentation

## 2011-11-20 DIAGNOSIS — I10 Essential (primary) hypertension: Secondary | ICD-10-CM | POA: Insufficient documentation

## 2011-11-20 DIAGNOSIS — F10929 Alcohol use, unspecified with intoxication, unspecified: Secondary | ICD-10-CM

## 2011-11-20 DIAGNOSIS — Z8673 Personal history of transient ischemic attack (TIA), and cerebral infarction without residual deficits: Secondary | ICD-10-CM | POA: Insufficient documentation

## 2011-11-20 LAB — CBC WITH DIFFERENTIAL/PLATELET
Basophils Absolute: 0 10*3/uL (ref 0.0–0.1)
Eosinophils Absolute: 0.1 10*3/uL (ref 0.0–0.7)
Eosinophils Relative: 3 % (ref 0–5)
HCT: 39.9 % (ref 39.0–52.0)
Lymphs Abs: 1 10*3/uL (ref 0.7–4.0)
MCH: 35.2 pg — ABNORMAL HIGH (ref 26.0–34.0)
MCV: 104.2 fL — ABNORMAL HIGH (ref 78.0–100.0)
Monocytes Absolute: 0.4 10*3/uL (ref 0.1–1.0)
Platelets: 242 10*3/uL (ref 150–400)
RDW: 13.3 % (ref 11.5–15.5)

## 2011-11-20 LAB — ACETAMINOPHEN LEVEL: Acetaminophen (Tylenol), Serum: <15 ug/mL (ref 10–30)

## 2011-11-20 LAB — SALICYLATE LEVEL: Salicylate Lvl: <2 mg/dL — ABNORMAL LOW (ref 2.8–20.0)

## 2011-11-20 LAB — RAPID URINE DRUG SCREEN, HOSP PERFORMED
Amphetamines: NOT DETECTED
Opiates: NOT DETECTED
Tetrahydrocannabinol: NOT DETECTED

## 2011-11-20 LAB — BASIC METABOLIC PANEL
CO2: 23 mEq/L (ref 19–32)
Calcium: 8.3 mg/dL — ABNORMAL LOW (ref 8.4–10.5)
Chloride: 108 mEq/L (ref 96–112)
Creatinine, Ser: 0.74 mg/dL (ref 0.50–1.35)
Glucose, Bld: 79 mg/dL (ref 70–99)

## 2011-11-20 LAB — ETHANOL: Alcohol, Ethyl (B): 314 mg/dL — ABNORMAL HIGH (ref 0–11)

## 2011-11-20 MED ORDER — LORAZEPAM 1 MG PO TABS: 0.0000 mg | ORAL_TABLET | Freq: Four times a day (QID) | ORAL | Status: DC

## 2011-11-20 MED ORDER — VITAMIN B-1 100 MG PO TABS
100.0000 mg | ORAL_TABLET | Freq: Every day | ORAL | Status: DC
  Administered 2011-11-20: 100 mg via ORAL
  Filled 2011-11-20: qty 1

## 2011-11-20 MED ORDER — LORAZEPAM 2 MG/ML IJ SOLN: 1.0000 mg | Freq: Four times a day (QID) | INTRAMUSCULAR | Status: DC | PRN

## 2011-11-20 MED ORDER — LORAZEPAM 1 MG PO TABS: 0.0000 mg | ORAL_TABLET | Freq: Two times a day (BID) | ORAL | Status: DC

## 2011-11-20 MED ORDER — THIAMINE HCL 100 MG/ML IJ SOLN: 100.0000 mg | Freq: Every day | INTRAMUSCULAR | Status: DC

## 2011-11-20 MED ORDER — ADULT MULTIVITAMIN W/MINERALS CH
1.0000 | ORAL_TABLET | Freq: Every day | ORAL | Status: DC
  Administered 2011-11-20: 1 via ORAL
  Filled 2011-11-20: qty 1

## 2011-11-20 MED ORDER — FOLIC ACID 1 MG PO TABS
1.0000 mg | ORAL_TABLET | Freq: Every day | ORAL | Status: DC
  Administered 2011-11-20: 1 mg via ORAL
  Filled 2011-11-20: qty 1

## 2011-11-20 MED ORDER — LORAZEPAM 1 MG PO TABS: 1.0000 mg | ORAL_TABLET | Freq: Four times a day (QID) | ORAL | Status: DC | PRN

## 2011-11-20 NOTE — ED Notes (Signed)
Social worker completed consult.  SW states pt is not interested in any assistance or shelter at this time.  Pt is concerned about his belongings.  Pt reassured that his belongings are secured and no one will bother them.

## 2011-11-20 NOTE — ED Notes (Signed)
Red garment bag, blue, canvas [employer] bag and gray cooler filled with assorted items placed on shelf in soiled utility room for pod C

## 2011-11-20 NOTE — ED Notes (Signed)
[place] [facility] in [place].  Pt states he is here because he drank Listerine for a sedative.  Pt states he drinks Listerine because he cannot afford pain medication for his chronic back pain.  Pt offered shower and is in shower at this time.  Pt states it has been 4 months since he had had a shower.

## 2011-11-20 NOTE — ED Notes (Signed)
Spoke at length with pt re: various issues.  Pt has been homeless since 2006 and prefers to stay that way until he qualifies for his retirement check in a year.  [place]  He is very familiar social service agencies in [place], including the homeless shelters.  Pt gets food stamps, currently getting meds through [facility] and has friends who help him out.  Pt not interested in rehab and maintains that he is here in the ED because he can't stand the heat.  Emotional support offered.

## 2011-11-20 NOTE — ED Provider Notes (Signed)
History    This chart was scribed for [name] Anger, DO, MD by [name] [name]. The patient was seen in room [facility] and the patient's care was started at 1400.   CSN: [id number]  Arrival date & time 11/20/11  1115   First MD Initiated Contact with Patient 11/20/11 1317      Chief Complaint  Patient presents with  . Alcohol Intoxication     The history is provided by the patient and the EMS personnel. History Limited By: intoxicated.   [name] [name] is a 61 y.o. male who presents to the Emergency Department complaining of intoxication with Listerine.  States he drank it to become intoxicated.  EMS found pt homeless, sleeping on a bench.    Past Medical History  Diagnosis Date  . Hypercholesteremia   . Stroke   . Hypertension     History reviewed. No pertinent past surgical history.   History  Substance Use Topics  . Smoking status: Current Everyday Smoker -- 0.2 packs/day    Types: Cigarettes  . Smokeless tobacco: Not on file  . Alcohol Use: Yes     drinks bottles of 1pint  mouthwash daily    Review of Systems  Unable to perform ROS: Other    Allergies  Review of patient's allergies indicates no known allergies.  Home Medications   Current Outpatient Rx  Name Route Sig Dispense Refill  . AMLODIPINE BESYLATE 10 MG PO TABS Oral Take 10 mg by mouth daily.      . ATORVASTATIN CALCIUM 20 MG PO TABS Oral Take 20 mg by mouth daily.      Marland Kitchen CLOPIDOGREL BISULFATE 75 MG PO TABS Oral Take 75 mg by mouth daily.      Marland Kitchen LISINOPRIL 5 MG PO TABS Oral Take 5 mg by mouth daily.        There were no vitals taken for this visit.  Physical Exam 1420: Physical examination:  Nursing notes reviewed; Vital signs and O2 SAT reviewed;  Constitutional: Well developed, Well nourished, Well hydrated, In no acute distress; Head:  Normocephalic, atraumatic; Eyes: EOMI, PERRL, No scleral icterus; ENMT: Mouth and pharynx normal, Mucous membranes moist; Neck: Supple, Full range of  motion, No lymphadenopathy; Cardiovascular: Regular rate and rhythm, No murmur, rub, or gallop; Respiratory: Breath sounds clear & equal bilaterally, No rales, rhonchi, wheezes.  Speaking full sentences with ease, Normal respiratory effort/excursion; Chest: Nontender, Movement normal;; Extremities: Pulses normal, No tenderness, No edema, No calf edema or asymmetry.; Neuro: AA&Ox3, Major CN grossly intact.  Speech slurred. Moves all ext spontaneously without apparent gross focal motor deficits.; Skin: Color normal, Warm, Dry.   ED Course  Procedures   MDM  MDM Reviewed: nursing note and vitals Interpretation: labs   Results for orders placed during the hospital encounter of 11/20/11  BASIC METABOLIC PANEL      Component Value Range   Sodium 146 (*) 135 - 145 mEq/L   Potassium 3.3 (*) 3.5 - 5.1 mEq/L   Chloride 108  96 - 112 mEq/L   CO2 23  19 - 32 mEq/L   Glucose, Bld 79  70 - 99 mg/dL   BUN 10  6 - 23 mg/dL   Creatinine, Ser 7.82  0.50 - 1.35 mg/dL   Calcium 8.3 (*) 8.4 - 10.5 mg/dL   GFR calc non Af Amer >90  >90 mL/min   GFR calc Af Amer >90  >90 mL/min  CBC WITH DIFFERENTIAL      Component Value Range  WBC 5.0  4.0 - 10.5 K/uL   RBC 3.83 (*) 4.22 - 5.81 MIL/uL   Hemoglobin 13.5  13.0 - 17.0 g/dL   HCT 11.9  14.7 - 82.9 %   MCV 104.2 (*) 78.0 - 100.0 fL   MCH 35.2 (*) 26.0 - 34.0 pg   MCHC 33.8  30.0 - 36.0 g/dL   RDW 56.2  13.0 - 86.5 %   Platelets 242  150 - 400 K/uL   Neutrophils Relative 70  43 - 77 %   Neutro Abs 3.5  1.7 - 7.7 K/uL   Lymphocytes Relative 20  12 - 46 %   Lymphs Abs 1.0  0.7 - 4.0 K/uL   Monocytes Relative 8  3 - 12 %   Monocytes Absolute 0.4  0.1 - 1.0 K/uL   Eosinophils Relative 3  0 - 5 %   Eosinophils Absolute 0.1  0.0 - 0.7 K/uL   Basophils Relative 0  0 - 1 %   Basophils Absolute 0.0  0.0 - 0.1 K/uL  URINE RAPID DRUG SCREEN (HOSP PERFORMED)      Component Value Range   Opiates NONE DETECTED  NONE DETECTED   Cocaine NONE DETECTED  NONE  DETECTED   Benzodiazepines NONE DETECTED  NONE DETECTED   Amphetamines NONE DETECTED  NONE DETECTED   Tetrahydrocannabinol NONE DETECTED  NONE DETECTED   Barbiturates NONE DETECTED  NONE DETECTED  ETHANOL      Component Value Range   Alcohol, Ethyl (B) 314 (*) 0 - 11 mg/dL  SALICYLATE LEVEL      Component Value Range   Salicylate Lvl <2.0 (*) 2.8 - 20.0 mg/dL  ACETAMINOPHEN LEVEL      Component Value Range   Acetaminophen (Tylenol), Serum <15.0  10 - 30 ug/mL     1455:  Social Worker called, will eval to assist with shelter placement.    2000:  Pt remains cooperative.  Will need to demonstrate sobriety if being discharged on his own.  Sign out to Dr. Rhunette Croft.         [name] Anger, DO 11/22/11 1143

## 2011-11-20 NOTE — ED Notes (Signed)
Pt ambulatory to bathroom with no difficulty 

## 2011-11-20 NOTE — ED Notes (Signed)
Pt has taken shower and given all belongings to RN for search.  Pt cooperative and relaxing in room.

## 2011-11-20 NOTE — ED Notes (Addendum)
Per EMS: pt homeless and was sleeping on bench; pt drank Listerine today to become intoxicated; pt denies SI/HI; pt alert at present; pt sts wanted to come to hospital "because it was hot outside"

## 2011-11-20 NOTE — ED Notes (Signed)
Pt states, "That stuff does not make you feel sick. It makes you feel good. You only get sick from drinking the blue & the green. I drank the amber. What the date? I have to be in court tomorrow because I am suing [employer] because I slipped & fell."

## 2011-11-20 NOTE — ED Notes (Signed)
Ordered dinner tray.  

## 2011-11-21 NOTE — ED Notes (Signed)
Pt in blue scrubs and resting currently.  Will continue to monitor.

## 2011-11-21 NOTE — ED Provider Notes (Signed)
The patient was here voluntarily for treatment of alcohol dependence.  Arrangements were in the process of being made for inpatient treatment when he decided he no longer desired this and wanted discharge.  He seems to comprehend what he is doing and has the decision making capacity to take responsibility for himself.  He will be discharged at his request.  Geoffery Lyons, MD 11/21/11 [phone]

## 2011-11-21 NOTE — ED Notes (Signed)
Pt is wanting to go because he has a court date today.  Pt is not shaky, just anxious about getting out of here.

## 2011-11-27 ENCOUNTER — Emergency Department (HOSPITAL_COMMUNITY)
Admission: EM | Admit: 2011-11-27 | Discharge: 2011-11-27 | Disposition: A | Discharge status: Home / Self Care | Payer: Self-pay | Source: Home / Self Care | Attending: Family Medicine | Admitting: Family Medicine

## 2011-11-27 ENCOUNTER — Encounter (HOSPITAL_COMMUNITY): Payer: Self-pay | Admitting: Emergency Medicine

## 2011-11-27 DIAGNOSIS — I10 Essential (primary) hypertension: Secondary | ICD-10-CM

## 2011-11-27 MED ORDER — AMLODIPINE BESYLATE 10 MG PO TABS: 10.0000 mg | ORAL_TABLET | Freq: Every day | ORAL | Status: DC

## 2011-11-27 MED ORDER — LISINOPRIL 10 MG PO TABS: 10.0000 mg | ORAL_TABLET | Freq: Every day | ORAL | Status: DC

## 2011-11-27 MED ORDER — CLOPIDOGREL BISULFATE 75 MG PO TABS: 75.0000 mg | ORAL_TABLET | Freq: Every day | ORAL | Status: DC

## 2011-11-27 NOTE — ED Provider Notes (Signed)
History     CSN: [id number]  Arrival date & time 11/27/11  1054   First MD Initiated Contact with Patient 11/27/11 1243      Chief Complaint  Patient presents with  . Medication Refill    (Consider location/radiation/quality/duration/timing/severity/associated sxs/prior treatment) Patient is a 61 y.o. male presenting with hypertension. The history is provided by the patient.  Hypertension This is a new problem. The current episode started more than 1 week ago (not taken meds in sev weeks, no money, is getting help from nurse Dottie). The problem has not changed since onset.Pertinent negatives include no chest pain and no headaches.    Past Medical History  Diagnosis Date  . Hypercholesteremia   . Stroke   . Hypertension     History reviewed. No pertinent past surgical history.  No family history on file.  History  Substance Use Topics  . Smoking status: Current Everyday Smoker -- 0.2 packs/day    Types: Cigarettes  . Smokeless tobacco: Not on file  . Alcohol Use: Yes     drinks bottles of 1pint  mouthwash daily      Review of Systems  Constitutional: Negative.   Cardiovascular: Negative for chest pain.  Neurological: Negative for headaches.    Allergies  Review of patient's allergies indicates no known allergies.  Home Medications   Current Outpatient Rx  Name Route Sig Dispense Refill  . AMLODIPINE BESYLATE 10 MG PO TABS Oral Take 10 mg by mouth daily.      Marland Kitchen AMLODIPINE BESYLATE 10 MG PO TABS Oral Take 1 tablet (10 mg total) by mouth daily. 30 tablet 1  . ATORVASTATIN CALCIUM 20 MG PO TABS Oral Take 20 mg by mouth daily.      Marland Kitchen CLOPIDOGREL BISULFATE 75 MG PO TABS Oral Take 75 mg by mouth daily.      Marland Kitchen CLOPIDOGREL BISULFATE 75 MG PO TABS Oral Take 1 tablet (75 mg total) by mouth daily. 30 tablet 1  . LISINOPRIL 10 MG PO TABS Oral Take 1 tablet (10 mg total) by mouth daily. 30 tablet 1  . LISINOPRIL 5 MG PO TABS Oral Take 5 mg by mouth daily.        BP  173/118  Pulse 85  Temp 98.4 F (36.9 C) (Oral)  Resp 16  SpO2 100%  Physical Exam  Nursing note and vitals reviewed. Constitutional: He is oriented to person, place, and time. He appears well-developed.  HENT:  Head: Normocephalic.  Eyes: Pupils are equal, round, and reactive to light.  Cardiovascular: Normal rate, regular rhythm and normal heart sounds.   Pulmonary/Chest: Breath sounds normal.  Neurological: He is alert and oriented to person, place, and time.  Skin: Skin is warm and dry.    ED Course  Procedures (including critical care time)  Labs Reviewed - No data to display No results found.   1. Hypertension       MDM          Linna Hoff, MD 11/27/11 1320

## 2011-11-27 NOTE — ED Notes (Signed)
Medication refill.  [facility]  [facility] recommended patient come to [facility].

## 2011-11-30 ENCOUNTER — Encounter (HOSPITAL_COMMUNITY): Payer: Self-pay

## 2011-11-30 ENCOUNTER — Emergency Department (HOSPITAL_COMMUNITY)
Admission: EM | Admit: 2011-11-30 | Discharge: 2011-12-01 | Disposition: A | Discharge status: Home / Self Care | Payer: Self-pay | Attending: Emergency Medicine | Admitting: Emergency Medicine

## 2011-11-30 DIAGNOSIS — F10229 Alcohol dependence with intoxication, unspecified: Secondary | ICD-10-CM | POA: Insufficient documentation

## 2011-11-30 DIAGNOSIS — I10 Essential (primary) hypertension: Secondary | ICD-10-CM | POA: Insufficient documentation

## 2011-11-30 DIAGNOSIS — Z8673 Personal history of transient ischemic attack (TIA), and cerebral infarction without residual deficits: Secondary | ICD-10-CM | POA: Insufficient documentation

## 2011-11-30 DIAGNOSIS — E7800 Pure hypercholesterolemia, unspecified: Secondary | ICD-10-CM | POA: Insufficient documentation

## 2011-11-30 DIAGNOSIS — F17200 Nicotine dependence, unspecified, uncomplicated: Secondary | ICD-10-CM | POA: Insufficient documentation

## 2011-11-30 DIAGNOSIS — F10929 Alcohol use, unspecified with intoxication, unspecified: Secondary | ICD-10-CM

## 2011-11-30 LAB — RAPID URINE DRUG SCREEN, HOSP PERFORMED: Amphetamines: NOT DETECTED

## 2011-11-30 LAB — COMPREHENSIVE METABOLIC PANEL
Albumin: 3.6 g/dL (ref 3.5–5.2)
Alkaline Phosphatase: 82 U/L (ref 39–117)
BUN: 13 mg/dL (ref 6–23)
CO2: 27 mEq/L (ref 19–32)
Chloride: 105 mEq/L (ref 96–112)
GFR calc Af Amer: >90 mL/min (ref >90)
GFR calc non Af Amer: >90 mL/min (ref >90)
Glucose, Bld: 98 mg/dL (ref 70–99)
Potassium: 3.4 mEq/L — ABNORMAL LOW (ref 3.5–5.1)
Total Bilirubin: 0.1 mg/dL — ABNORMAL LOW (ref 0.3–1.2)

## 2011-11-30 LAB — GLUCOSE, CAPILLARY: Glucose-Capillary: 109 mg/dL — ABNORMAL HIGH (ref 70–99)

## 2011-11-30 LAB — CBC WITH DIFFERENTIAL/PLATELET
HCT: 41.1 % (ref 39.0–52.0)
Hemoglobin: 14.2 g/dL (ref 13.0–17.0)
Lymphs Abs: 1.5 10*3/uL (ref 0.7–4.0)
Monocytes Relative: 8 % (ref 3–12)
Neutro Abs: 1.7 10*3/uL (ref 1.7–7.7)
Neutrophils Relative %: 46 % (ref 43–77)
RBC: 3.89 MIL/uL — ABNORMAL LOW (ref 4.22–5.81)

## 2011-11-30 LAB — ETHANOL: Alcohol, Ethyl (B): 320 mg/dL — ABNORMAL HIGH (ref 0–11)

## 2011-11-30 MED ORDER — SODIUM CHLORIDE 0.9 % IV BOLUS (SEPSIS)
1000.0000 mL | INTRAVENOUS | Status: AC
  Administered 2011-12-01: 1000 mL via INTRAVENOUS

## 2011-11-30 MED ORDER — SODIUM CHLORIDE 0.9 % IV BOLUS (SEPSIS)
1000.0000 mL | INTRAVENOUS | Status: AC
  Administered 2011-11-30: 1000 mL via INTRAVENOUS

## 2011-11-30 MED ORDER — SODIUM CHLORIDE 0.9 % IV SOLN
INTRAVENOUS | Status: DC
  Administered 2011-11-30: 22:00:00 via INTRAVENOUS

## 2011-11-30 NOTE — ED Notes (Signed)
EMS called out for altered mental status. The people at the scene new him vaguely at the seen, reports "he is not acting normal" Pt was confused on arrival. Pt denies complaints. Admits to drinking full sized mouthwash bottle and 40 oz beer.

## 2011-11-30 NOTE — ED Notes (Signed)
Pt states it was too hot outside and he drank mouthwash today. Alertx4 pt well kept, states homeless.

## 2011-11-30 NOTE — ED Notes (Signed)
Pt is aware of the urine sample needed, urinal is at the bedside 

## 2011-11-30 NOTE — ED Provider Notes (Signed)
I saw and evaluated the patient, reviewed the resident's note and I agree with the findings and plan.  Patient without signs of cranial trauma. Admits to drinking copious amounts of alcohol today. Suspect that is the etiology of his current symptoms  Toy Baker, MD 11/30/11 2135

## 2011-11-30 NOTE — ED Provider Notes (Signed)
History     CSN: [id number]  Arrival date & time 11/30/11  2026   None     Chief Complaint  Patient presents with  . Altered Mental Status    (Consider location/radiation/quality/duration/timing/severity/associated sxs/prior treatment) Patient is a 61 y.o. male presenting with intoxication. The history is provided by the patient.  Alcohol Intoxication This is a recurrent problem. The current episode started today. The problem occurs constantly. The problem has been unchanged. Pertinent negatives include no abdominal pain, chest pain, coughing, fever, headaches, nausea, neck pain, numbness or vomiting. Nothing aggravates the symptoms. He has tried nothing for the symptoms. The treatment provided no relief.    Past Medical History  Diagnosis Date  . Hypercholesteremia   . Stroke   . Hypertension     History reviewed. No pertinent past surgical history.  History reviewed. No pertinent family history.  History  Substance Use Topics  . Smoking status: Current Every Day Smoker -- 0.2 packs/day    Types: Cigarettes  . Smokeless tobacco: Not on file  . Alcohol Use: Yes     drinks bottles of 1pint  mouthwash daily      Review of Systems  Constitutional: Negative for fever.  HENT: Negative for rhinorrhea, drooling and neck pain.   Eyes: Negative for pain.  Respiratory: Negative for cough and shortness of breath.   Cardiovascular: Negative for chest pain and leg swelling.  Gastrointestinal: Negative for nausea, vomiting, abdominal pain and diarrhea.  Genitourinary: Negative for dysuria and hematuria.  Musculoskeletal: Negative for gait problem.  Skin: Negative for color change.  Neurological: Negative for numbness and headaches.  Hematological: Negative for adenopathy.  Psychiatric/Behavioral: Negative for behavioral problems.  All other systems reviewed and are negative.    Allergies  Review of patient's allergies indicates no known allergies.  Home Medications    Current Outpatient Rx  Name Route Sig Dispense Refill  . AMLODIPINE BESYLATE 10 MG PO TABS Oral Take 10 mg by mouth daily.      Marland Kitchen CLOPIDOGREL BISULFATE 75 MG PO TABS Oral Take 75 mg by mouth daily.      Marland Kitchen LISINOPRIL 10 MG PO TABS Oral Take 1 tablet (10 mg total) by mouth daily. 30 tablet 1    BP 142/85  Pulse 74  Temp 98.1 F (36.7 C) (Oral)  Resp 16  SpO2 95%  Physical Exam  Nursing note and vitals reviewed. Constitutional: He is oriented to person, place, and time. He appears well-developed and well-nourished.  HENT:  Head: Normocephalic and atraumatic.  Right Ear: External ear normal.  Left Ear: External ear normal.  Nose: Nose normal.  Mouth/Throat: Oropharynx is clear and moist. No oropharyngeal exudate.  Eyes: Conjunctivae normal and EOM are normal. Pupils are equal, round, and reactive to light.  Neck: Normal range of motion. Neck supple.       No cervical ttp.   Cardiovascular: Normal rate, regular rhythm, normal heart sounds and intact distal pulses.  Exam reveals no gallop and no friction rub.   No murmur heard. Pulmonary/Chest: Effort normal and breath sounds normal. No respiratory distress. He has no wheezes.  Abdominal: Soft. Bowel sounds are normal. He exhibits no distension. There is no tenderness. There is no rebound and no guarding.  Musculoskeletal: Normal range of motion. He exhibits no edema and no tenderness.  Neurological: He is alert and oriented to person, place, and time.       Mild instability when ambulating.   Skin: Skin is warm and dry.  Psychiatric: He has a normal mood and affect. His behavior is normal.    ED Course  Procedures (including critical care time)  Labs Reviewed  CBC WITH DIFFERENTIAL - Abnormal; Notable for the following:    WBC 3.7 (*)     RBC 3.89 (*)     MCV 105.7 (*)     MCH 36.5 (*)     Eosinophils Relative 7 (*)     All other components within normal limits  COMPREHENSIVE METABOLIC PANEL - Abnormal; Notable for the  following:    Potassium 3.4 (*)     Total Bilirubin 0.1 (*)     All other components within normal limits  ETHANOL - Abnormal; Notable for the following:    Alcohol, Ethyl (B) 320 (*)     All other components within normal limits  GLUCOSE, CAPILLARY - Abnormal; Notable for the following:    Glucose-Capillary 109 (*)     All other components within normal limits  URINE RAPID DRUG SCREEN (HOSP PERFORMED)   No results found.   1. Alcohol intoxication       MDM  4:34 PM 61 y.o. male w hx of etoh abuse pw intoxication. Pt admits to drinking bottle of mouthwash and 40oz beer today. Pt brought in for intoxication. He denies injury, is a/o x3, ambulates w/ mild instability. Will get IVF and allow pt to metabolize.    Transferred care to Dr. Hyacinth Meeker to allow pt to continue metabolizing.   Clinical Impression 1. Alcohol intoxication         Purvis Sheffield, MD 12/01/11 [phone]

## 2011-12-01 NOTE — ED Provider Notes (Signed)
  Physical Exam  BP 138/90  Pulse 77  Temp 98.1 F (36.7 C) (Oral)  Resp 20  SpO2 92%  Physical Exam  ED Course  Procedures  MDM Patient accepted at change of shift from Dr. Freida Busman, has no complaints this morning was able to sleep throughout the evening without any difficulty, has no signs of withdrawal at this time, has clear mentation is able to ambulate and tolerate fluids and food without any difficulty, will discharge home.      Vida Roller, MD 12/01/11 [phone]

## 2011-12-01 NOTE — ED Notes (Signed)
Pt easily awakened. Pt is alert and oriented x 4. Breakfast ordered.

## 2011-12-01 NOTE — ED Provider Notes (Signed)
I saw and evaluated the patient, reviewed the resident's note and I agree with the findings and plan.  Simaya Lumadue T Dillan Lunden, MD 12/01/11 1656 

## 2012-03-05 ENCOUNTER — Encounter (HOSPITAL_COMMUNITY): Payer: Self-pay | Admitting: Physical Medicine and Rehabilitation

## 2012-03-05 ENCOUNTER — Inpatient Hospital Stay (HOSPITAL_COMMUNITY)
Admission: EM | Admit: 2012-03-05 | Discharge: 2012-03-11 | DRG: 897 | Disposition: A | Discharge status: Home / Self Care | Payer: MEDICAID | Attending: Internal Medicine | Admitting: Internal Medicine

## 2012-03-05 DIAGNOSIS — E7800 Pure hypercholesterolemia, unspecified: Secondary | ICD-10-CM | POA: Diagnosis present

## 2012-03-05 DIAGNOSIS — E860 Dehydration: Secondary | ICD-10-CM | POA: Diagnosis present

## 2012-03-05 DIAGNOSIS — I6350 Cerebral infarction due to unspecified occlusion or stenosis of unspecified cerebral artery: Secondary | ICD-10-CM

## 2012-03-05 DIAGNOSIS — Z7902 Long term (current) use of antithrombotics/antiplatelets: Secondary | ICD-10-CM

## 2012-03-05 DIAGNOSIS — I10 Essential (primary) hypertension: Secondary | ICD-10-CM | POA: Diagnosis present

## 2012-03-05 DIAGNOSIS — G4700 Insomnia, unspecified: Secondary | ICD-10-CM | POA: Diagnosis present

## 2012-03-05 DIAGNOSIS — Z8673 Personal history of transient ischemic attack (TIA), and cerebral infarction without residual deficits: Secondary | ICD-10-CM

## 2012-03-05 DIAGNOSIS — F10929 Alcohol use, unspecified with intoxication, unspecified: Secondary | ICD-10-CM

## 2012-03-05 DIAGNOSIS — Z79899 Other long term (current) drug therapy: Secondary | ICD-10-CM

## 2012-03-05 DIAGNOSIS — N390 Urinary tract infection, site not specified: Secondary | ICD-10-CM | POA: Diagnosis present

## 2012-03-05 DIAGNOSIS — F17200 Nicotine dependence, unspecified, uncomplicated: Secondary | ICD-10-CM | POA: Diagnosis present

## 2012-03-05 DIAGNOSIS — F1010 Alcohol abuse, uncomplicated: Principal | ICD-10-CM | POA: Diagnosis present

## 2012-03-05 DIAGNOSIS — R531 Weakness: Secondary | ICD-10-CM | POA: Diagnosis present

## 2012-03-05 DIAGNOSIS — E876 Hypokalemia: Secondary | ICD-10-CM | POA: Diagnosis present

## 2012-03-05 LAB — CBC WITH DIFFERENTIAL/PLATELET
Basophils Relative: 1 % (ref 0–1)
Eosinophils Absolute: 0.1 10*3/uL (ref 0.0–0.7)
Lymphs Abs: 1.1 10*3/uL (ref 0.7–4.0)
MCH: 36.4 pg — ABNORMAL HIGH (ref 26.0–34.0)
Neutrophils Relative %: 55 % (ref 43–77)
Platelets: 125 10*3/uL — ABNORMAL LOW (ref 150–400)
RBC: 3.68 MIL/uL — ABNORMAL LOW (ref 4.22–5.81)

## 2012-03-05 MED ORDER — FOLIC ACID 1 MG PO TABS
1.0000 mg | ORAL_TABLET | Freq: Once | ORAL | Status: AC
  Administered 2012-03-05: 1 mg via ORAL
  Filled 2012-03-05: qty 1

## 2012-03-05 MED ORDER — POTASSIUM CHLORIDE 10 MEQ/100ML IV SOLN
10.0000 meq | Freq: Once | INTRAVENOUS | Status: AC
  Administered 2012-03-06: 10 meq via INTRAVENOUS
  Filled 2012-03-05: qty 100

## 2012-03-05 MED ORDER — POTASSIUM CHLORIDE CRYS ER 20 MEQ PO TBCR
40.0000 meq | EXTENDED_RELEASE_TABLET | Freq: Once | ORAL | Status: AC
  Administered 2012-03-06: 40 meq via ORAL
  Filled 2012-03-05: qty 2

## 2012-03-05 MED ORDER — THIAMINE HCL 100 MG/ML IJ SOLN
100.0000 mg | Freq: Once | INTRAMUSCULAR | Status: AC
  Administered 2012-03-05: 100 mg via INTRAVENOUS
  Filled 2012-03-05: qty 2

## 2012-03-05 MED ORDER — SODIUM CHLORIDE 0.9 % IV BOLUS (SEPSIS)
1000.0000 mL | Freq: Once | INTRAVENOUS | Status: AC
  Administered 2012-03-05: 1000 mL via INTRAVENOUS

## 2012-03-05 MED ORDER — SODIUM CHLORIDE 0.9 % IV SOLN
Freq: Once | INTRAVENOUS | Status: AC
  Administered 2012-03-05: via INTRAVENOUS

## 2012-03-05 NOTE — ED Notes (Signed)
Pt states he came to the ED because he has no access to food or medication. Pt states he is exhausted and extremely weak because he needs food. NAD noted at this time.

## 2012-03-05 NOTE — ED Provider Notes (Signed)
History     CSN: [id number]  Arrival date & time 03/05/12  1803   First MD Initiated Contact with Patient 03/05/12 2143      Chief Complaint  Patient presents with  . Alcohol Intoxication    (Consider location/radiation/quality/duration/timing/severity/associated sxs/prior treatment) Patient is a 61 y.o. male presenting with intoxication. The history is provided by the patient.  Alcohol Intoxication  He says that he was not able to walk today so EMS was called. EMS reports says that he was ambulatory at the scene. He has been drinking heavily. He had told triage nurse that he had 2 bottles of Listerine today and he admits to drinking a pint of liquor when I ask him. He says he feels generally weak which she attributes to being homeless and not having regular meals. He denies any pain anywhere denies any nausea or vomiting. He does smoke about one quarter pack of cigarettes a day but denies any street drugs.  Past Medical History  Diagnosis Date  . Hypercholesteremia   . Stroke   . Hypertension     No past surgical history on file.  History reviewed. No pertinent family history.  History  Substance Use Topics  . Smoking status: Current Every Day Smoker -- 0.2 packs/day    Types: Cigarettes  . Smokeless tobacco: Not on file  . Alcohol Use: Yes     Comment: drinks bottles of 1pint  mouthwash daily      Review of Systems  All other systems reviewed and are negative.    Allergies  Review of patient's allergies indicates no known allergies.  Home Medications   Current Outpatient Rx  Name  Route  Sig  Dispense  Refill  . AMLODIPINE BESYLATE 10 MG PO TABS   Oral   Take 10 mg by mouth daily.           Marland Kitchen CLOPIDOGREL BISULFATE 75 MG PO TABS   Oral   Take 75 mg by mouth daily.           Marland Kitchen LISINOPRIL 10 MG PO TABS   Oral   Take 1 tablet (10 mg total) by mouth daily.   30 tablet   1     BP 148/90  Pulse 70  Temp 97.2 F (36.2 C) (Oral)  SpO2  98%  Physical Exam  Nursing note and vitals reviewed. 61 year old male, resting comfortably and in no acute distress. Vital signs are significant for mild hypertension with blood pressure 140/90. Oxygen saturation is 98%, which is normal. Head is normocephalic and atraumatic. PERRLA, EOMI. Oropharynx is clear. Neck is nontender and supple without adenopathy or JVD. Back is nontender and there is no CVA tenderness. Lungs are clear without rales, wheezes, or rhonchi. Chest is nontender. Heart has regular rate and rhythm without murmur. Abdomen is soft, flat, nontender without masses or hepatosplenomegaly and peristalsis is normoactive. Extremities have no cyanosis or edema, full range of motion is present. Skin is warm and dry without rash. Neurologic: Mental status is normal, cranial nerves are intact, there are no motor or sensory deficits.   ED Course  Procedures (including critical care time)  Results for orders placed during the hospital encounter of 03/05/12  CBC WITH DIFFERENTIAL      Component Value Range   WBC 3.2 (*) 4.0 - 10.5 K/uL   RBC 3.68 (*) 4.22 - 5.81 MIL/uL   Hemoglobin 13.4  13.0 - 17.0 g/dL   HCT 56.2 (*) 13.0 - 86.5 %  MCV 105.7 (*) 78.0 - 100.0 fL   MCH 36.4 (*) 26.0 - 34.0 pg   MCHC 34.4  30.0 - 36.0 g/dL   RDW 08.6  57.8 - 46.9 %   Platelets 125 (*) 150 - 400 K/uL   Neutrophils Relative 55  43 - 77 %   Neutro Abs 1.7  1.7 - 7.7 K/uL   Lymphocytes Relative 35  12 - 46 %   Lymphs Abs 1.1  0.7 - 4.0 K/uL   Monocytes Relative 8  3 - 12 %   Monocytes Absolute 0.2  0.1 - 1.0 K/uL   Eosinophils Relative 3  0 - 5 %   Eosinophils Absolute 0.1  0.0 - 0.7 K/uL   Basophils Relative 1  0 - 1 %   Basophils Absolute 0.0  0.0 - 0.1 K/uL  COMPREHENSIVE METABOLIC PANEL      Component Value Range   Sodium 140  135 - 145 mEq/L   Potassium 2.7 (*) 3.5 - 5.1 mEq/L   Chloride 96  96 - 112 mEq/L   CO2 26  19 - 32 mEq/L   Glucose, Bld 74  70 - 99 mg/dL   BUN 9  6 - 23  mg/dL   Creatinine, Ser 6.29  0.50 - 1.35 mg/dL   Calcium 8.8  8.4 - 52.8 mg/dL   Total Protein 7.0  6.0 - 8.3 g/dL   Albumin 3.6  3.5 - 5.2 g/dL   AST 90 (*) 0 - 37 U/L   ALT 39  0 - 53 U/L   Alkaline Phosphatase 76  39 - 117 U/L   Total Bilirubin 0.3  0.3 - 1.2 mg/dL   GFR calc non Af Amer >90  >90 mL/min   GFR calc Af Amer >90  >90 mL/min  CK      Component Value Range   Total CK 71  7 - 232 U/L  ETHANOL      Component Value Range   Alcohol, Ethyl (B) 211 (*) 0 - 11 mg/dL    1. Alcohol intoxication   2. Hypokalemia       MDM  Alcohol abuse with alcohol intoxication. He'll be given some IV fluids and IV Timentin and oral folate. Screening labs have been ordered. Old records have been reviewed, and he has several ED visits for alcohol intoxication and homelessness.  Laboratory workup in terms alcohol intoxication and significant hypokalemia. This may be part of his complaints of weakness. It is most likely nutritional. He will be given oral and IV potassium and discharged with a perception for potassium.  Dione Booze, MD 03/06/12 [phone]

## 2012-03-05 NOTE — ED Notes (Signed)
Pt presents to department via [facility] for evaluation of ETOH intoxication. Admits to drinking (2) bottles of listerine throughout day. Called EMS because "he couldn't walk." pt ambulatory on scene. He is alert and oriented x4.

## 2012-03-06 DIAGNOSIS — F1010 Alcohol abuse, uncomplicated: Principal | ICD-10-CM | POA: Diagnosis present

## 2012-03-06 DIAGNOSIS — R5383 Other fatigue: Secondary | ICD-10-CM

## 2012-03-06 DIAGNOSIS — E876 Hypokalemia: Secondary | ICD-10-CM

## 2012-03-06 DIAGNOSIS — N390 Urinary tract infection, site not specified: Secondary | ICD-10-CM | POA: Diagnosis present

## 2012-03-06 DIAGNOSIS — R5381 Other malaise: Secondary | ICD-10-CM

## 2012-03-06 DIAGNOSIS — R531 Weakness: Secondary | ICD-10-CM | POA: Diagnosis present

## 2012-03-06 LAB — BASIC METABOLIC PANEL
CO2: 27 mEq/L (ref 19–32)
CO2: 28 mEq/L (ref 19–32)
Calcium: 8.3 mg/dL — ABNORMAL LOW (ref 8.4–10.5)
Chloride: 99 mEq/L (ref 96–112)
Chloride: 101 mEq/L (ref 96–112)
Creatinine, Ser: 0.57 mg/dL (ref 0.50–1.35)
Glucose, Bld: 77 mg/dL (ref 70–99)
Sodium: 140 mEq/L (ref 135–145)

## 2012-03-06 LAB — RAPID URINE DRUG SCREEN, HOSP PERFORMED
Amphetamines: NOT DETECTED
Cocaine: NOT DETECTED
Opiates: NOT DETECTED
Tetrahydrocannabinol: NOT DETECTED

## 2012-03-06 LAB — COMPREHENSIVE METABOLIC PANEL
ALT: 39 U/L (ref 0–53)
AST: 90 U/L — ABNORMAL HIGH (ref 0–37)
Albumin: 3.6 g/dL (ref 3.5–5.2)
Alkaline Phosphatase: 76 U/L (ref 39–117)
Glucose, Bld: 74 mg/dL (ref 70–99)
Potassium: 2.7 mEq/L — CL (ref 3.5–5.1)
Sodium: 140 mEq/L (ref 135–145)
Total Protein: 7 g/dL (ref 6.0–8.3)

## 2012-03-06 LAB — URINALYSIS, ROUTINE W REFLEX MICROSCOPIC
Glucose, UA: NEGATIVE mg/dL
Hgb urine dipstick: NEGATIVE
Specific Gravity, Urine: 1.014 (ref 1.005–1.030)
pH: 6 (ref 5.0–8.0)

## 2012-03-06 LAB — POCT I-STAT, CHEM 8
Calcium, Ion: 1.03 mmol/L — ABNORMAL LOW (ref 1.13–1.30)
Chloride: 100 mEq/L (ref 96–112)
Creatinine, Ser: 0.8 mg/dL (ref 0.50–1.35)
Glucose, Bld: 150 mg/dL — ABNORMAL HIGH (ref 70–99)
HCT: 35 % — ABNORMAL LOW (ref 39.0–52.0)

## 2012-03-06 LAB — URINE MICROSCOPIC-ADD ON

## 2012-03-06 MED ORDER — POTASSIUM CHLORIDE CRYS ER 20 MEQ PO TBCR: 20.0000 meq | EXTENDED_RELEASE_TABLET | Freq: Two times a day (BID) | ORAL | Status: DC

## 2012-03-06 MED ORDER — LORAZEPAM 1 MG PO TABS
1.0000 mg | ORAL_TABLET | Freq: Four times a day (QID) | ORAL | Status: AC | PRN
  Administered 2012-03-06: 1 mg via ORAL
  Filled 2012-03-06: qty 1

## 2012-03-06 MED ORDER — CLOPIDOGREL BISULFATE 75 MG PO TABS
75.0000 mg | ORAL_TABLET | Freq: Every day | ORAL | Status: DC
  Administered 2012-03-06 – 2012-03-11 (×6): 75 mg via ORAL
  Filled 2012-03-06 (×7): qty 1

## 2012-03-06 MED ORDER — POTASSIUM CHLORIDE 10 MEQ/100ML IV SOLN
10.0000 meq | Freq: Once | INTRAVENOUS | Status: AC
  Administered 2012-03-06: 10 meq via INTRAVENOUS
  Filled 2012-03-06: qty 100

## 2012-03-06 MED ORDER — THIAMINE HCL 100 MG/ML IJ SOLN
Freq: Once | INTRAVENOUS | Status: AC
  Administered 2012-03-06: 08:00:00 via INTRAVENOUS
  Filled 2012-03-06: qty 1000

## 2012-03-06 MED ORDER — DEXTROSE 5 % IV SOLN
1.0000 g | Freq: Once | INTRAVENOUS | Status: AC
  Administered 2012-03-06: 1 g via INTRAVENOUS
  Filled 2012-03-06: qty 10

## 2012-03-06 MED ORDER — POTASSIUM CHLORIDE CRYS ER 20 MEQ PO TBCR
40.0000 meq | EXTENDED_RELEASE_TABLET | Freq: Once | ORAL | Status: AC
  Administered 2012-03-06: 40 meq via ORAL
  Filled 2012-03-06: qty 2

## 2012-03-06 MED ORDER — LORAZEPAM 2 MG/ML IJ SOLN: 1.0000 mg | Freq: Four times a day (QID) | INTRAMUSCULAR | Status: AC | PRN

## 2012-03-06 MED ORDER — CIPROFLOXACIN HCL 250 MG PO TABS
250.0000 mg | ORAL_TABLET | Freq: Two times a day (BID) | ORAL | Status: DC
  Administered 2012-03-06 – 2012-03-09 (×8): 250 mg via ORAL
  Filled 2012-03-06 (×9): qty 1

## 2012-03-06 MED ORDER — SODIUM CHLORIDE 0.9 % IV SOLN
INTRAVENOUS | Status: DC
  Administered 2012-03-06: 18:00:00 via INTRAVENOUS
  Filled 2012-03-06 (×5): qty 1000

## 2012-03-06 MED ORDER — AMLODIPINE BESYLATE 10 MG PO TABS
10.0000 mg | ORAL_TABLET | Freq: Every day | ORAL | Status: DC
  Administered 2012-03-06 – 2012-03-11 (×6): 10 mg via ORAL
  Filled 2012-03-06 (×6): qty 1

## 2012-03-06 MED ORDER — SODIUM CHLORIDE 0.9 % IJ SOLN
3.0000 mL | Freq: Two times a day (BID) | INTRAMUSCULAR | Status: DC
  Administered 2012-03-06: 11:00:00 via INTRAVENOUS
  Administered 2012-03-07 – 2012-03-11 (×9): 3 mL via INTRAVENOUS

## 2012-03-06 MED ORDER — MAGNESIUM SULFATE 40 MG/ML IJ SOLN
2.0000 g | Freq: Once | INTRAMUSCULAR | Status: AC
  Administered 2012-03-06: 2 g via INTRAVENOUS
  Filled 2012-03-06: qty 50

## 2012-03-06 MED ORDER — POTASSIUM CHLORIDE CRYS ER 20 MEQ PO TBCR: 40.0000 meq | EXTENDED_RELEASE_TABLET | Freq: Once | ORAL | Status: DC

## 2012-03-06 MED ORDER — LISINOPRIL 10 MG PO TABS
10.0000 mg | ORAL_TABLET | Freq: Every day | ORAL | Status: DC
  Administered 2012-03-06 – 2012-03-11 (×6): 10 mg via ORAL
  Filled 2012-03-06 (×6): qty 1

## 2012-03-06 MED ORDER — POTASSIUM CHLORIDE 20 MEQ PO PACK: 40.0000 meq | PACK | Freq: Once | ORAL | Status: DC

## 2012-03-06 NOTE — Progress Notes (Signed)
[facility] PROGRESS NOTE  Wellington Winegarden UJW:[id number] DOB: Aug 01, 1950 DOA: 03/05/2012 PCP: Provider Not In System  Assessment/Plan: Hypokalemia Likely in the setting of poor nutrition and dehydration he has received several runs of IV and by mouth KCl. His potassium this morning has finally improved to 3.4.  I will continue with IV fluids with KCl for now.  Alcohol abuse Patient has been drinking about a pint of history and everyday for a long time. Patient had an anion gap on presentation which is now closed. Continue with IV hydration. Continue with banana bag, folate and multivitamin. Continue CIWA protocol. Monitor for any withdrawal symptoms.  Generalized weakness Likely in the setting of heavy alcohol use and poor nutritional status. We'll obtain PT eval.  UTI Being treated with by mouth ciprofloxacin. Follow urine culture   Patient strongly counseled on his alcohol abuse and to avoid listerine   Code Status: Full code Family Communication: None Disposition Plan: Pending PT   Consultants:  None  Procedures:  None  Antibiotics:  ciprofloxacin  HPI/Subjective: Informs feeling very weak. Denies any headache or blurry vision. Denies nausea vomiting. Informs of drinking restrained as he did not want to be caught by the police the alcohol in the system.  Objective: Filed Vitals:   03/06/12 0315 03/06/12 0400 03/06/12 0500 03/06/12 0559  BP: 136/82 133/86 132/85 146/101  Pulse:    92  Temp:    98.5 F (36.9 C)  TempSrc:    Oral  Resp: 18 16  20   Height:    5\' 8"  (1.727 m)  Weight:    70.217 kg (154 lb 12.8 oz)  SpO2:    98%    Intake/Output Summary (Last 24 hours) at 03/06/12 0826 Last data filed at 03/06/12 5621  Gross per 24 hour  Intake    420 ml  Output    150 ml  Net    270 ml   Filed Weights   03/06/12 0559  Weight: 70.217 kg (154 lb 12.8 oz)    Exam:   General: Elderly disheveled male in no acute distress  HEENT: No pallor,  moist oral mucosa  Cardiovascular: Normal S1 and S2, no murmurs rub or gallop  Respiratory: Clear to auscultation bilaterally no added sounds  Abdomen: Soft, nontender, nondistended, bowel sounds present  Extremities: Warm, no edema  CNS: AAO x3, no tremors  Data Reviewed: Basic Metabolic Panel:  Lab 03/06/12 3086 03/06/12 0305 03/06/12 0255 03/05/12 2158  NA 140 140 140 140  K 3.4* 2.5* 2.6* 2.7*  CL 101 99 100 96  CO2 28 27 -- 26  GLUCOSE 77 154* 150* 74  BUN 8 9 8 9   CREATININE 0.55 0.57 0.80 0.54  CALCIUM 8.3* 8.3* -- 8.8  MG -- 2.3 -- --  PHOS -- -- -- --   Liver Function Tests:  Lab 03/05/12 2158  AST 90*  ALT 39  ALKPHOS 76  BILITOT 0.3  PROT 7.0  ALBUMIN 3.6   No results found for this basename: LIPASE:5,AMYLASE:5 in the last 168 hours No results found for this basename: AMMONIA:5 in the last 168 hours CBC:  Lab 03/06/12 0255 03/05/12 2158  WBC -- 3.2*  NEUTROABS -- 1.7  HGB 11.9* 13.4  HCT 35.0* 38.9*  MCV -- 105.7*  PLT -- 125*   Cardiac Enzymes:  Lab 03/05/12 2158  CKTOTAL 71  CKMB --  CKMBINDEX --  TROPONINI --   BNP (last 3 results) No results found for this basename: PROBNP:3 in the  last 8760 hours CBG: No results found for this basename: GLUCAP:5 in the last 168 hours  No results found for this or any previous visit (from the past 240 hour(s)).   Studies: No results found.  Scheduled Meds:   . amLODipine  10 mg Oral Daily  . ciprofloxacin  250 mg Oral BID  . clopidogrel  75 mg Oral Q breakfast  . lisinopril  10 mg Oral Daily  . potassium chloride  40 mEq Oral Once  . sodium chloride  3 mL Intravenous Q12H   Continuous Infusions:   . sodium chloride 0.9 % 1,000 mL with potassium chloride 40 mEq infusion        Time spent: 25 MINUTES    Kyliah Deanda  [facility] Pager [phone] If 8PM-8AM, please contact night-coverage at [email or web address], password [facility] 03/06/2012, 8:26 AM  LOS: 1 day

## 2012-03-06 NOTE — ED Notes (Addendum)
Pt  Was transported to ED back as no bed was no available.

## 2012-03-06 NOTE — Progress Notes (Signed)
CRITICAL VALUE ALERT  Critical value received:  2.50potassium  Date of notification:12/19  0715  Critical value read back:yes  Nurse who received alert: Rishika Mccollom  MD notified (1st page):[email or web address]  Time of first page:  0715  MD notified (2nd page):  Time of second page:  Responding MD: orders placed  Time MD responded: [phone]

## 2012-03-06 NOTE — ED Notes (Signed)
Called [facility]00 for bed.As per the nurse they have bed but has no one to clean and the bed.

## 2012-03-06 NOTE — ED Notes (Signed)
Pt transported to [facility] on cardiac monitor.

## 2012-03-06 NOTE — Progress Notes (Signed)
Utilization review completed.  

## 2012-03-06 NOTE — H&P (Signed)
Chief Complaint:  Generalized weakness  HPI: 61 yo male h/o etoh abuse [place] when he got weak in his legs.  Came to ED.  He drinks at least a pint of mouthwash a day.  [place] open container law in the city, he cannot drink beer or hard liquor without getting a ticket.  So to avoid this, he drinks mouthwash instead.  Several days ago he [place] something [place] and had about 24 hours of mult episodes of diarrhea.  This has all resolved.  No fevers.  No n/v.  No abd pain.  Has received ivf and k riders in the ED and his k is still low.  mag sulfate has also been given.  He does feel better.  Review of Systems:  O/w neg  Past Medical History: Past Medical History  Diagnosis Date  . Hypercholesteremia   . Stroke   . Hypertension     Medications: Prior to Admission medications   Medication Sig Start Date End Date Taking? Authorizing Provider  amLODipine (NORVASC) 10 MG tablet Take 10 mg by mouth daily.      Historical Provider, MD  clopidogrel (PLAVIX) 75 MG tablet Take 75 mg by mouth daily.      Historical Provider, MD  lisinopril (PRINIVIL,ZESTRIL) 10 MG tablet Take 1 tablet (10 mg total) by mouth daily. 11/27/11 11/26/12  Linna Hoff, MD  potassium chloride SA (K-DUR,KLOR-CON) 20 MEQ tablet Take 1 tablet (20 mEq total) by mouth 2 (two) times daily. 03/06/12   Dione Booze, MD    Allergies:  No Known Allergies  Social History:  reports that he has been smoking Cigarettes.  He has been smoking about .25 packs per day. He does not have any smokeless tobacco history on file. He reports that he drinks alcohol. He reports that he does not use illicit drugs.  Family History: History reviewed. No pertinent family history.  Physical Exam: Filed Vitals:   03/06/12 0209 03/06/12 0210 03/06/12 0300 03/06/12 0309  BP:   133/79   Pulse:      Temp: 99.3 F (37.4 C)   99.1 F (37.3 C)  TempSrc: Oral      Resp:  20 26   SpO2:       General appearance: alert, cooperative and no distress no s/sx of withdrawal Neck: no JVD and supple, symmetrical, trachea midline Lungs: clear to auscultation bilaterally Heart: regular rate and rhythm, S1, S2 normal, no murmur, click, rub or gallop Abdomen: soft, non-tender; bowel sounds normal; no masses,  no organomegaly Extremities: extremities normal, atraumatic, no cyanosis or edema Pulses: 2+ and symmetric Skin: Skin color, texture, turgor normal. No rashes or lesions Neurologic: Grossly normal    Labs on Admission:   [facility] 03/06/12 0255 03/05/12 2158  NA 140 140  K 2.6* 2.7*  CL 100 96  CO2 -- 26  GLUCOSE 150* 74  BUN 8 9  CREATININE 0.80 0.54  CALCIUM -- 8.8  MG -- --  PHOS -- --    Basename 03/05/12 2158  AST 90*  ALT 39  ALKPHOS 76  BILITOT 0.3  PROT 7.0  ALBUMIN 3.6    Basename 03/06/12 0255 03/05/12 2158  WBC -- 3.2*  NEUTROABS -- 1.7  HGB 11.9* 13.4  HCT 35.0* 38.9*  MCV -- 105.7*  PLT -- 125*    Basename 03/05/12 2158  CKTOTAL 71  CKMB --  CKMBINDEX --  TROPONINI --   Radiological Exams on Admission: No results found.  Assessment/Plan 61 yo male with hypokalemia and generalized weakness with mouthwash abuse  Principal Problem:  *Hypokalemia Active Problems:  HYPERTENSION  ETOH abuse  Generalized weakness  UTI (lower urinary tract infection)  Place on iv bananna bag with potassium.  Has received po and iv kcl in ED and k still at 2.6.  Will repeat level again in an hour or so.  Monitor on tele overnight.  Obs.  Advised that drinking mouthwash is not good for him, he knows this.  Place on ciwa protocol.  Place on cipro for his uti.  Full code.  Swara Donze A 03/06/2012, 3:10 AM

## 2012-03-07 ENCOUNTER — Encounter (HOSPITAL_COMMUNITY): Payer: Self-pay | Admitting: *Deleted

## 2012-03-07 DIAGNOSIS — N390 Urinary tract infection, site not specified: Secondary | ICD-10-CM

## 2012-03-07 LAB — CBC
HCT: 37.5 % — ABNORMAL LOW (ref 39.0–52.0)
Hemoglobin: 12.5 g/dL — ABNORMAL LOW (ref 13.0–17.0)
MCH: 35.6 pg — ABNORMAL HIGH (ref 26.0–34.0)
MCV: 106.8 fL — ABNORMAL HIGH (ref 78.0–100.0)
Platelets: 122 10*3/uL — ABNORMAL LOW (ref 150–400)
RBC: 3.51 MIL/uL — ABNORMAL LOW (ref 4.22–5.81)

## 2012-03-07 LAB — BASIC METABOLIC PANEL
BUN: 5 mg/dL — ABNORMAL LOW (ref 6–23)
Creatinine, Ser: 0.53 mg/dL (ref 0.50–1.35)
GFR calc Af Amer: >90 mL/min (ref >90)
GFR calc non Af Amer: >90 mL/min (ref >90)
Glucose, Bld: 89 mg/dL (ref 70–99)

## 2012-03-07 NOTE — Evaluation (Signed)
Physical Therapy Evaluation Patient Details Name: [name] [name] MRN: [id number] DOB: 1950-06-04 Today's Date: 03/07/2012 Time: 6578-4696 PT Time Calculation (min): 23 min  PT Assessment / Plan / Recommendation Clinical Impression  Pt admitted with generalized weakness and hypokalemia. Pt is currently homeless and is unable to use an assistive device as he carries his stuff with him. Discussed safety upon d/c secondary to pt requiring assistance with all mobility. Recommending SNF at this stime for increased safety upon dc    PT Assessment  Patient needs continued PT services    Follow Up Recommendations  SNF;Supervision/Assistance - 24 hour    Does the patient have the potential to tolerate intense rehabilitation      Barriers to Discharge        Equipment Recommendations  Rolling walker with 5" wheels    Recommendations for Other Services     Frequency Min 3X/week    Precautions / Restrictions Precautions Precautions: Fall Restrictions Weight Bearing Restrictions: No   Pertinent Vitals/Pain Pt with no pain      Mobility  Bed Mobility Bed Mobility: Not assessed Transfers Transfers: Stand to Sit;Sit to Stand Sit to Stand: 4: Min assist;With upper extremity assist;From bed Stand to Sit: 4: Min assist;With upper extremity assist;To bed Details for Transfer Assistance: Assist for stability.  Ambulation/Gait Ambulation/Gait Assistance: 3: Mod assist;4: Min assist Ambulation Distance (Feet): 75 Feet Assistive device: None Ambulation/Gait Assistance Details: Mod assist for stability as pt with LOB x 2 requiring increased assistance. Pt refusing AD stating that he is unable to carry his bags if he has an assistive device. Discussed options Gait Pattern: Step-to pattern;Decreased hip/knee flexion - right;Decreased hip/knee flexion - left;Trunk flexed;Narrow base of support Gait velocity: slow    Shoulder Instructions     Exercises     PT Diagnosis: Difficulty  walking  PT Problem List: Decreased strength;Decreased activity tolerance;Decreased balance;Decreased mobility;Decreased knowledge of use of DME;Decreased safety awareness;Decreased knowledge of precautions PT Treatment Interventions: DME instruction;Therapeutic activities;Therapeutic exercise;Functional mobility training;Stair training;Gait training;Balance training;Patient/family education   PT Goals Acute Rehab PT Goals PT Goal Formulation: With patient Time For Goal Achievement: 03/14/12 Potential to Achieve Goals: Fair Pt will go Sit to Stand: with supervision PT Goal: Sit to Stand - Progress: Goal set today Pt will go Stand to Sit: with supervision PT Goal: Stand to Sit - Progress: Goal set today Pt will Transfer Bed to Chair/Chair to Bed: with supervision PT Transfer Goal: Bed to Chair/Chair to Bed - Progress: Goal set today Pt will Ambulate: 51 - 150 feet;with modified independence;with least restrictive assistive device PT Goal: Ambulate - Progress: Goal set today  Visit Information  Last PT Received On: 03/07/12 Assistance Needed: +1    Subjective Data  Patient Stated Goal: no goal stated   Prior Functioning  Home Living Lives With: Alone Additional Comments: [place] [address]; has been homeless for 8 years Prior Function Level of Independence: Independent Able to Take Stairs?: Yes Driving: No Vocation: Unemployed Musician: No difficulties Dominant Hand: Right    Cognition  Overall Cognitive Status: Appears within functional limits for tasks assessed/performed Arousal/Alertness: Awake/alert Orientation Level: Appears intact for tasks assessed Behavior During Session: [facility] for tasks performed    Extremity/Trunk Assessment Right Lower Extremity Assessment RLE ROM/Strength/Tone: Deficits RLE ROM/Strength/Tone Deficits: ankle 3/5. Hip and Knee 4/5 RLE Sensation: Deficits RLE Sensation Deficits: decreased sensation throughout,  greater in ankle Left Lower Extremity Assessment LLE ROM/Strength/Tone: Within functional levels LLE Sensation: WFL - Light Touch  Balance Balance Balance Assessed: Yes Static Standing Balance Static Standing - Balance Support: Right upper extremity supported;During functional activity Static Standing - Level of Assistance: 4: Min assist Static Standing - Comment/# of Minutes: Min assist for stability while standing at toilet during functional activity  End of Session PT - End of Session Equipment Utilized During Treatment: Gait belt Activity Tolerance: Patient tolerated treatment well Patient left: with call bell/phone within reach;in bed;with bed alarm set Nurse Communication: Mobility status  GP     Milana Kidney 03/07/2012, 12:04 PM  03/07/2012 Milana Kidney DPT PAGER: [phone] OFFICE: [phone]

## 2012-03-07 NOTE — Progress Notes (Signed)
[facility] PROGRESS NOTE  [name] [name] WUJ:[id number] DOB: [birth year] DOA: 03/05/2012 PCP: Provider Not In System  Assessment/Plan:  Hypokalemia  Likely in the setting of poor nutrition and dehydration he has received several runs of IV and by mouth KCl. His potassium has finally improved. I will DC fluids .  Alcohol abuse  Patient has been drinking about a pint of listerine everyday for a long time. Patient had an anion gap on presentation which is now closed. The new with thiamine folate and multivitamin. Counseled strongly on not to drink Listerine -No signs of alcohol withdrawal . continue with CIWA   Generalized weakness  Likely in the setting of heavy alcohol use and poor nutritional status. He recommends SNF  UTI  Being treated with by mouth ciprofloxacin. Follow urine culture    Code Status: Full code  Family Communication: None  Disposition Plan: SNF ( patient is homeless)  Consultants:  None Procedures:  None Antibiotics:  ciprofloxacin    HPI/Subjective: Had few episodes of watery diarrhea overnight. No other symptoms  Objective: Filed Vitals:   03/06/12 1958 03/07/12 0507 03/07/12 0940 03/07/12 1422  BP: 141/79 147/89 147/94 131/82  Pulse: 90 86 98 70  Temp: 98.1 F (36.7 C) 98.4 F (36.9 C) 98 F (36.7 C)   TempSrc: Oral Oral Oral   Resp: 20 20 18 18   Height:      Weight: 72.5 kg (159 lb 13.3 oz)     SpO2: 97% 96% 95% 93%    Intake/Output Summary (Last 24 hours) at 03/07/12 1456 Last data filed at 03/07/12 1300  Gross per 24 hour  Intake   1023 ml  Output   2125 ml  Net  -1102 ml   Filed Weights   03/06/12 0559 03/06/12 1958  Weight: 70.217 kg (154 lb 12.8 oz) 72.5 kg (159 lb 13.3 oz)    Exam:  *General: Elderly disheveled male in no acute distress  HEENT: No pallor, moist oral mucosa  Cardiovascular: Normal S1 and S2, no murmurs rub or gallop  Respiratory: Clear to auscultation bilaterally no added sounds  Abdomen:  Soft, nontender, nondistended, bowel sounds present  Extremities: Warm, no edema  CNS: AAO x3, no tremors    Data Reviewed: Basic Metabolic Panel:  Lab 03/07/12 9562 03/06/12 1025 03/06/12 0715 03/06/12 0305 03/06/12 0255 03/05/12 2158  NA 138 -- 140 140 140 140  K 3.8 3.5 3.4* 2.5* 2.6* --  CL 103 -- 101 99 100 96  CO2 24 -- 28 27 -- 26  GLUCOSE 89 -- 77 154* 150* 74  BUN 5* -- 8 9 8 9   CREATININE 0.53 -- 0.55 0.57 0.80 0.54  CALCIUM 8.7 -- 8.3* 8.3* -- 8.8  MG -- -- -- 2.3 -- --  PHOS -- -- -- -- -- --   Liver Function Tests:  Lab 03/05/12 2158  AST 90*  ALT 39  ALKPHOS 76  BILITOT 0.3  PROT 7.0  ALBUMIN 3.6   No results found for this basename: LIPASE:5,AMYLASE:5 in the last 168 hours No results found for this basename: AMMONIA:5 in the last 168 hours CBC:  Lab 03/07/12 0505 03/06/12 0255 03/05/12 2158  WBC 4.3 -- 3.2*  NEUTROABS -- -- 1.7  HGB 12.5* 11.9* 13.4  HCT 37.5* 35.0* 38.9*  MCV 106.8* -- 105.7*  PLT 122* -- 125*   Cardiac Enzymes:  Lab 03/05/12 2158  CKTOTAL 71  CKMB --  CKMBINDEX --  TROPONINI --   BNP (last 3 results) No results  found for this basename: PROBNP:3 in the last 8760 hours CBG: No results found for this basename: GLUCAP:5 in the last 168 hours  No results found for this or any previous visit (from the past 240 hour(s)).   Studies: No results found.  Scheduled Meds:   . amLODipine  10 mg Oral Daily  . ciprofloxacin  250 mg Oral BID  . clopidogrel  75 mg Oral Q breakfast  . lisinopril  10 mg Oral Daily  . potassium chloride  40 mEq Oral Once  . sodium chloride  3 mL Intravenous Q12H   Continuous Infusions:   . sodium chloride 0.9 % 1,000 mL with potassium chloride 40 mEq infusion 100 mL/hr at 03/06/12 1807       Time spent: 25 minutes    Priscila Bean  [facility] Pager [phone]. If 8PM-8AM, please contact night-coverage at [email or web address], password [facility] 03/07/2012, 2:56 PM  LOS: 2 days

## 2012-03-08 LAB — URINE CULTURE: Colony Count: >=100000

## 2012-03-08 MED ORDER — TEMAZEPAM 7.5 MG PO CAPS
7.5000 mg | ORAL_CAPSULE | Freq: Every evening | ORAL | Status: DC | PRN
  Administered 2012-03-08 – 2012-03-11 (×4): 7.5 mg via ORAL
  Filled 2012-03-08 (×4): qty 1

## 2012-03-08 NOTE — Progress Notes (Addendum)
[facility] PROGRESS NOTE  [name] [name] ZOX:[id number] DOB: 1951-01-29 DOA: 03/05/2012 PCP: Provider Not In System  Assessment/Plan:  Hypokalemia  Likely in the setting of poor nutrition and dehydration he has received several runs of IV and by mouth KCl. His potassium has finally improved. I will DC fluids .  Alcohol abuse  Patient has been drinking about a pint of listerine everyday for a long time. Patient had an anion gap on presentation which is now closed. The new with thiamine folate and multivitamin. Counseled strongly on not to drink Listerine -No signs of alcohol withdrawal . continue with CIWA   Generalized weakness  Likely in the setting of heavy alcohol use and poor nutritional status. PTrecommended SNF -SW consulted to assist with placement   UTI  Being treated with by mouth ciprofloxacin. Follow urine culture   Insomnia -restoril prn   Code Status: Full code  Family Communication: None  Disposition Plan: SNF ( patient is homeless)  Consultants:  None Procedures:  None Antibiotics:  ciprofloxacin    HPI/Subjective: states did not sleep last pm, no futher diarrhea reported Objective: Filed Vitals:   03/07/12 0507 03/07/12 0940 03/07/12 1422 03/07/12 2254  BP: 147/89 147/94 131/82 140/85  Pulse: 86 98 70 91  Temp: 98.4 F (36.9 C) 98 F (36.7 C)  99.2 F (37.3 C)  TempSrc: Oral Oral  Oral  Resp: 20 18 18 15   Height:      Weight:      SpO2: 96% 95% 93% 95%    Intake/Output Summary (Last 24 hours) at 03/08/12 0937 Last data filed at 03/08/12 0600  Gross per 24 hour  Intake    360 ml  Output    750 ml  Net   -390 ml   Filed Weights   03/06/12 0559 03/06/12 1958  Weight: 70.217 kg (154 lb 12.8 oz) 72.5 kg (159 lb 13.3 oz)    Exam:  *General: Elderly disheveled male in no acute distress  HEENT: No pallor, moist oral mucosa  Cardiovascular: Normal S1 and S2, no murmurs rub or gallop  Respiratory: Clear to auscultation  bilaterally no added sounds  Abdomen: Soft, nontender, nondistended, bowel sounds present  Extremities: Warm, no edema  CNS: AAO x3, no tremors    Data Reviewed: Basic Metabolic Panel:  Lab 03/07/12 8119 03/06/12 1025 03/06/12 0715 03/06/12 0305 03/06/12 0255 03/05/12 2158  NA 138 -- 140 140 140 140  K 3.8 3.5 3.4* 2.5* 2.6* --  CL 103 -- 101 99 100 96  CO2 24 -- 28 27 -- 26  GLUCOSE 89 -- 77 154* 150* 74  BUN 5* -- 8 9 8 9   CREATININE 0.53 -- 0.55 0.57 0.80 0.54  CALCIUM 8.7 -- 8.3* 8.3* -- 8.8  MG -- -- -- 2.3 -- --  PHOS -- -- -- -- -- --   Liver Function Tests:  Lab 03/05/12 2158  AST 90*  ALT 39  ALKPHOS 76  BILITOT 0.3  PROT 7.0  ALBUMIN 3.6   No results found for this basename: LIPASE:5,AMYLASE:5 in the last 168 hours No results found for this basename: AMMONIA:5 in the last 168 hours CBC:  Lab 03/07/12 0505 03/06/12 0255 03/05/12 2158  WBC 4.3 -- 3.2*  NEUTROABS -- -- 1.7  HGB 12.5* 11.9* 13.4  HCT 37.5* 35.0* 38.9*  MCV 106.8* -- 105.7*  PLT 122* -- 125*   Cardiac Enzymes:  Lab 03/05/12 2158  CKTOTAL 71  CKMB --  CKMBINDEX --  TROPONINI --  BNP (last 3 results) No results found for this basename: PROBNP:3 in the last 8760 hours CBG: No results found for this basename: GLUCAP:5 in the last 168 hours  Recent Results (from the past 240 hour(s))  URINE CULTURE     Status: Normal   Collection Time   03/06/12  1:49 AM      Component Value Range Status Comment   Specimen Description URINE, RANDOM   Final    Special Requests CX ADDED AT 0225 ON [id number]   Final    Culture  Setup Time 03/06/2012 08:34   Final    Colony Count >=100,000 COLONIES/ML   Final    Culture     Final    Value: Multiple bacterial morphotypes present, none predominant. Suggest appropriate recollection if clinically indicated.   Report Status 03/08/2012 FINAL   Final      Studies: No results found.  Scheduled Meds:    . amLODipine  10 mg Oral Daily  . ciprofloxacin   250 mg Oral BID  . clopidogrel  75 mg Oral Q breakfast  . lisinopril  10 mg Oral Daily  . potassium chloride  40 mEq Oral Once  . sodium chloride  3 mL Intravenous Q12H   Continuous Infusions:      Time spent: 25 minutes    Daphane Odekirk C  [facility] Pager [phone]. If 8PM-8AM, please contact night-coverage at [email or web address], password [facility] 03/08/2012, 9:37 AM  LOS: 3 days

## 2012-03-09 DIAGNOSIS — I10 Essential (primary) hypertension: Secondary | ICD-10-CM

## 2012-03-09 NOTE — Progress Notes (Signed)
Patient is considered a high fall risk.  Discussed with patient the need for Bed Alarm and supervision when out of bed.  He refuses bed alarm and supervision when ambulating and in bathroom.  Will continue to monitor patient and respect his wishes.  Kristion Holifield, Justine Null

## 2012-03-09 NOTE — Progress Notes (Signed)
[facility] PROGRESS NOTE  [name] [name] YQM:[id number] DOB: [birth year] DOA: 03/05/2012 PCP: Provider Not In System  Assessment/Plan:  Hypokalemia  Likely in the setting of poor nutrition and dehydration he has received several runs of IV and by mouth KCl. His potassium  finally improved. ivf DC'ed .  Alcohol abuse  Patient has been drinking about a pint of listerine everyday for a long time. Patient had an anion gap on presentation which is now closed. The new with thiamine folate and multivitamin. Counseled strongly on not to drink Listerine -No signs of alcohol withdrawal . continue with CIWA   Generalized weakness  Likely in the setting of heavy alcohol use and poor nutritional status. PTrecommended SNF -SW to assist with placement   UTI  Being treated with by mouth ciprofloxacin.  urine culture only with multiple bacterial morphotypes- has completed 4days of cipro at this time- will dc.    Insomnia -restoril prn   Code Status: Full code  Family Communication: None  Disposition Plan: SNF ( patient is homeless)  Consultants:  None Procedures:  None Antibiotics:  Ciprofloxacin12/19>>12/22    HPI/Subjective: states he slept better last pm, denies any c/o Objective: Filed Vitals:   03/08/12 1800 03/08/12 2038 03/09/12 0428 03/09/12 0857  BP: 149/82 160/72 133/79 141/80  Pulse: 77 96 80 76  Temp: 97.9 F (36.6 C) 98.1 F (36.7 C) 98.6 F (37 C) 98.1 F (36.7 C)  TempSrc: Oral Oral Oral Oral  Resp:  18 18 18   Height:      Weight:  72.2 kg (159 lb 2.8 oz)    SpO2: 98% 99% 95% 96%    Intake/Output Summary (Last 24 hours) at 03/09/12 1628 Last data filed at 03/09/12 0815  Gross per 24 hour  Intake   1620 ml  Output   1475 ml  Net    145 ml   Filed Weights   03/06/12 0559 03/06/12 1958 03/08/12 2038  Weight: 70.217 kg (154 lb 12.8 oz) 72.5 kg (159 lb 13.3 oz) 72.2 kg (159 lb 2.8 oz)    Exam:  *General: Elderly disheveled male in no acute  distress  HEENT: No pallor, moist oral mucosa  Cardiovascular: Normal S1 and S2, no murmurs rub or gallop  Respiratory: Clear to auscultation bilaterally no added sounds  Abdomen: Soft, nontender, nondistended, bowel sounds present  Extremities: Warm, no edema  CNS: AAO x3, no tremors    Data Reviewed: Basic Metabolic Panel:  Lab 03/07/12 5284 03/06/12 1025 03/06/12 0715 03/06/12 0305 03/06/12 0255 03/05/12 2158  NA 138 -- 140 140 140 140  K 3.8 3.5 3.4* 2.5* 2.6* --  CL 103 -- 101 99 100 96  CO2 24 -- 28 27 -- 26  GLUCOSE 89 -- 77 154* 150* 74  BUN 5* -- 8 9 8 9   CREATININE 0.53 -- 0.55 0.57 0.80 0.54  CALCIUM 8.7 -- 8.3* 8.3* -- 8.8  MG -- -- -- 2.3 -- --  PHOS -- -- -- -- -- --   Liver Function Tests:  Lab 03/05/12 2158  AST 90*  ALT 39  ALKPHOS 76  BILITOT 0.3  PROT 7.0  ALBUMIN 3.6   No results found for this basename: LIPASE:5,AMYLASE:5 in the last 168 hours No results found for this basename: AMMONIA:5 in the last 168 hours CBC:  Lab 03/07/12 0505 03/06/12 0255 03/05/12 2158  WBC 4.3 -- 3.2*  NEUTROABS -- -- 1.7  HGB 12.5* 11.9* 13.4  HCT 37.5* 35.0* 38.9*  MCV 106.8* --  105.7*  PLT 122* -- 125*   Cardiac Enzymes:  Lab 03/05/12 2158  CKTOTAL 71  CKMB --  CKMBINDEX --  TROPONINI --   BNP (last 3 results) No results found for this basename: PROBNP:3 in the last 8760 hours CBG: No results found for this basename: GLUCAP:5 in the last 168 hours  Recent Results (from the past 240 hour(s))  URINE CULTURE     Status: Normal   Collection Time   03/06/12  1:49 AM      Component Value Range Status Comment   Specimen Description URINE, RANDOM   Final    Special Requests CX ADDED AT 0225 ON [id number]   Final    Culture  Setup Time 03/06/2012 08:34   Final    Colony Count >=100,000 COLONIES/ML   Final    Culture     Final    Value: Multiple bacterial morphotypes present, none predominant. Suggest appropriate recollection if clinically indicated.   Report  Status 03/08/2012 FINAL   Final      Studies: No results found.  Scheduled Meds:    . amLODipine  10 mg Oral Daily  . ciprofloxacin  250 mg Oral BID  . clopidogrel  75 mg Oral Q breakfast  . lisinopril  10 mg Oral Daily  . potassium chloride  40 mEq Oral Once  . sodium chloride  3 mL Intravenous Q12H   Continuous Infusions:      Time spent: 25 minutes    Maicy Filip C  [facility] Pager [phone]. If 8PM-8AM, please contact night-coverage at [email or web address], password [facility] 03/09/2012, 4:28 PM  LOS: 4 days

## 2012-03-10 DIAGNOSIS — I6350 Cerebral infarction due to unspecified occlusion or stenosis of unspecified cerebral artery: Secondary | ICD-10-CM

## 2012-03-10 LAB — BASIC METABOLIC PANEL
BUN: 14 mg/dL (ref 6–23)
Calcium: 9.3 mg/dL (ref 8.4–10.5)
Creatinine, Ser: 0.78 mg/dL (ref 0.50–1.35)
GFR calc Af Amer: >90 mL/min (ref >90)

## 2012-03-10 NOTE — Progress Notes (Signed)
[facility] PROGRESS NOTE  [name] [name] NFA:[id number] DOB: 10-Nov-1950 DOA: 03/05/2012 PCP: Provider Not In System  Assessment/Plan:  Hypokalemia  Likely in the setting of poor nutrition and dehydration he has received several runs of IV and by mouth KCl. His potassium  finally improved. ivf DC'ed .  Alcohol abuse  Patient has been drinking about a pint of listerine everyday for a long time. Patient had an anion gap on presentation which is now closed. The new with thiamine folate and multivitamin. Counseled strongly on not to drink Listerine -No signs of alcohol withdrawal . continue with CIWA   Generalized weakness  Likely in the setting of heavy alcohol use and poor nutritional status. PT still recommendeding SNF -SW to assist with placement   UTI  Being treated with by mouth ciprofloxacin.  urine culture only with multiple bacterial morphotypes- has completed 4days of cipro.   Insomnia -restoril prn   Code Status: Full code  Family Communication: None  Disposition Plan: SNF ( patient is homeless)  Consultants:  None Procedures:  None Antibiotics:  Ciprofloxacin12/19>>12/22    HPI/Subjective: denies any c/o Objective: Filed Vitals:   03/09/12 1730 03/09/12 2053 03/10/12 0527 03/10/12 1000  BP: 128/72 123/78 132/86 124/78  Pulse: 83 86 71 76  Temp: 98.4 F (36.9 C) 97.8 F (36.6 C) 98.3 F (36.8 C) 98 F (36.7 C)  TempSrc: Oral Oral Oral Oral  Resp: 18 18 18 18   Height:  5\' 8"  (1.727 m)    Weight:  72.201 kg (159 lb 2.8 oz)    SpO2: 97% 100% 100% 100%    Intake/Output Summary (Last 24 hours) at 03/10/12 1157 Last data filed at 03/10/12 1100  Gross per 24 hour  Intake   1560 ml  Output   1376 ml  Net    184 ml   Filed Weights   03/06/12 1958 03/08/12 2038 03/09/12 2053  Weight: 72.5 kg (159 lb 13.3 oz) 72.2 kg (159 lb 2.8 oz) 72.201 kg (159 lb 2.8 oz)    Exam:  *General: Elderly disheveled male in no acute distress  HEENT: No  pallor, moist oral mucosa  Cardiovascular: Normal S1 and S2, no murmurs rub or gallop  Respiratory: Clear to auscultation bilaterally no added sounds  Abdomen: Soft, nontender, nondistended, bowel sounds present  Extremities: Warm, no edema  CNS: AAO x3, no tremors    Data Reviewed: Basic Metabolic Panel:  Lab 03/10/12 4696 03/07/12 0505 03/06/12 1025 03/06/12 0715 03/06/12 0305 03/06/12 0255 03/05/12 2158  NA 134* 138 -- 140 140 140 --  K 4.0 3.8 3.5 3.4* 2.5* -- --  CL 98 103 -- 101 99 100 --  CO2 29 24 -- 28 27 -- 26  GLUCOSE 92 89 -- 77 154* 150* --  BUN 14 5* -- 8 9 8  --  CREATININE 0.78 0.53 -- 0.55 0.57 0.80 --  CALCIUM 9.3 8.7 -- 8.3* 8.3* -- 8.8  MG -- -- -- -- 2.3 -- --  PHOS -- -- -- -- -- -- --   Liver Function Tests:  Lab 03/05/12 2158  AST 90*  ALT 39  ALKPHOS 76  BILITOT 0.3  PROT 7.0  ALBUMIN 3.6   No results found for this basename: LIPASE:5,AMYLASE:5 in the last 168 hours No results found for this basename: AMMONIA:5 in the last 168 hours CBC:  Lab 03/07/12 0505 03/06/12 0255 03/05/12 2158  WBC 4.3 -- 3.2*  NEUTROABS -- -- 1.7  HGB 12.5* 11.9* 13.4  HCT 37.5* 35.0*  38.9*  MCV 106.8* -- 105.7*  PLT 122* -- 125*   Cardiac Enzymes:  Lab 03/05/12 2158  CKTOTAL 71  CKMB --  CKMBINDEX --  TROPONINI --   BNP (last 3 results) No results found for this basename: PROBNP:3 in the last 8760 hours CBG: No results found for this basename: GLUCAP:5 in the last 168 hours  Recent Results (from the past 240 hour(s))  URINE CULTURE     Status: Normal   Collection Time   03/06/12  1:49 AM      Component Value Range Status Comment   Specimen Description URINE, RANDOM   Final    Special Requests CX ADDED AT 0225 ON [id number]   Final    Culture  Setup Time 03/06/2012 08:34   Final    Colony Count >=100,000 COLONIES/ML   Final    Culture     Final    Value: Multiple bacterial morphotypes present, none predominant. Suggest appropriate recollection if  clinically indicated.   Report Status 03/08/2012 FINAL   Final      Studies: No results found.  Scheduled Meds:    . amLODipine  10 mg Oral Daily  . clopidogrel  75 mg Oral Q breakfast  . lisinopril  10 mg Oral Daily  . potassium chloride  40 mEq Oral Once  . sodium chloride  3 mL Intravenous Q12H   Continuous Infusions:      Time spent: 25 minutes    [name] [name] C  [facility] Pager [phone]. If 8PM-8AM, please contact night-coverage at [email or web address], password [facility] 03/10/2012, 11:57 AM  LOS: 5 days

## 2012-03-10 NOTE — Clinical Social Work Psychosocial (Signed)
Clinical Social Work Department BRIEF PSYCHOSOCIAL ASSESSMENT 03/10/2012  Patient:  [name] [name], [name] [name]     Account Number:  [id number]     Admit date:  03/05/2012  Clinical Social Worker:  Delmer Islam  Date/Time:  03/10/2012 05:29 AM  Referred by:  Physician  Date Referred:  03/08/2012 Referred for  SNF Placement   Other Referral:   Interview type:   Other interview type:    PSYCHOSOCIAL DATA Living Status:   Admitted from facility:   Level of care:   Primary support name:   Primary support relationship to patient:   Degree of support available:   Patient reported that he has family but did not provide any information about them.    CURRENT CONCERNS Current Concerns  Post-Acute Placement   Other Concerns:    SOCIAL WORK ASSESSMENT / PLAN CSW talked with patient about discharge planning and PT recommendation of SNF vs 24/7 assistance/supervision. Patient is agreeable to SNF and was advised of the process in locating a SNF for him. [place] was given SNF list for [facility].  Patient shared with CSW regarding some aspects of his life as a homless individual. [place] has [place] outside of [place] [employer] [place] with [place] knowledge. He carries some of his belongings with him and a friend keeps a another bag for him. Patient did utilize [facility], but does not have a medical provider and is unsure how he will continue to get his medications. 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   Assessment/plan status:  Psychosocial Support/Ongoing Assessment of Needs Other assessment/ plan:   Information/referral to community resources:   Skilled Facility List    PATIENT'S/FAMILY'S RESPONSE TO PLAN OF CARE: Patient was pleasant and open with CSW. He is open to going to a skilled nursing facility for short  term rehab.

## 2012-03-10 NOTE — Progress Notes (Signed)
Physical Therapy Treatment Patient Details Name: [name] [name] MRN: [id number] DOB: 07-05-1950 Today's Date: 03/10/2012 Time: 0936-1000 PT Time Calculation (min): 24 min  PT Assessment / Plan / Recommendation Comments on Treatment Session  Admitted with malnourishment, hypokalemia;   Improving activity tolerance, with increased distance walked today;   Lengthy discussion re: pt's apparent need for UE support for balance with amb, which is in direct opposition to his daily need to take all his belongings with him when getting around as he perceives that he cannot do this and manage a RW or other device at the same time; Options include using a backpack (on pt's back or on RW), using a rollator RW, possibly pushing a wheelchair with his belongings in it;  According to pt,  for any of these options to work, pt will need to be able to have access to city buses as he states he depends on the city bus heavily for access to meals;   We discussed the solution of more inpt rehab to help pt become steadier and need less UE support for walking as well -- this therapist can't help but wonder if the pat accepted an assistive device with bil UE support, would his gait improve enough to show less of a skilled need for SNF    Follow Up Recommendations  SNF;Supervision/Assistance - 24 hour     Does the patient have the potential to tolerate intense rehabilitation     Barriers to Discharge        Equipment Recommendations  Other (comment);Rolling walker with 5" wheels (see also Comments on Treatment Session)    Recommendations for Other Services    Frequency Min 3X/week   Plan Discharge plan remains appropriate    Precautions / Restrictions Precautions Precautions: Fall   Pertinent Vitals/Pain no apparent distress     Mobility  Bed Mobility Bed Mobility: Supine to Sit;Sitting - Scoot to Edge of Bed Supine to Sit: 5: Supervision;With rails Sitting - Scoot to Edge of Bed: 4: Min  guard;With rail (without physical contact) Details for Bed Mobility Assistance: Pretty smooth transition coming to sit; cues for technique for scooting to EOB; Pt attempted standing from bed sitting quite far from edge, and without R foot on floor; When given cues to scoot to EOB and get feet on floor prior to standing, pt reported not understanding -- required slower cueing Transfers Transfers: Stand to Sit;Sit to Stand Sit to Stand: 4: Min guard;From bed;With upper extremity assist Stand to Sit: 4: Min guard;With upper extremity assist;To bed Details for Transfer Assistance: Cues for safety; Reached out for and requested handheld assist; This therapist stood close by for guard, encouraged pt to perform task without physical assist; Clearly preferring UE support of some kind Ambulation/Gait Ambulation/Gait Assistance: 3: Mod assist;4: Min assist Ambulation Distance (Feet): 150 Feet (greater than) Assistive device: 1 person hand held assist;Other (Comment) (and occasional use of hallway rail) Ambulation/Gait Assistance Details: Dependent on UE support on hallway rail or handheld assist for balance with amb; continued to discuss options for pt to carry his belongings while still ambulating as he declines walking without UE support, but yet rejects assistive devices, or suggestions; See also comments on treatment session Gait Pattern: Step-to pattern;Decreased hip/knee flexion - right;Decreased hip/knee flexion - left;Trunk flexed;Narrow base of support Gait velocity: slow    Exercises     PT Diagnosis:    PT Problem List:   PT Treatment Interventions:     PT Goals Acute Rehab PT Goals Time  For Goal Achievement: 03/14/12 Potential to Achieve Goals: Fair Pt will go Sit to Stand: with supervision PT Goal: Sit to Stand - Progress: Progressing toward goal Pt will go Stand to Sit: with supervision PT Goal: Stand to Sit - Progress: Progressing toward goal Pt will Ambulate: 51 - 150 feet;with  modified independence;with least restrictive assistive device PT Goal: Ambulate - Progress: Progressing toward goal  Visit Information  Last PT Received On: 03/10/12 Assistance Needed: +1    Subjective Data  Subjective: Agreeabel to amb Patient Stated Goal: no goal stated   Cognition  Overall Cognitive Status: Impaired Area of Impairment: Other (comment) Arousal/Alertness: Awake/alert Orientation Level: Appears intact for tasks assessed Behavior During Session: [facility] for tasks performed Cognition - Other Comments: Pt is showing some breakdown in logic during conversation about safety with ambulation and daily activities; he appropriately requests UE support (hand held assist, or using furniture or hallway rail), but then rejects options for assistive devices -- even declining a trial with rW today    Balance     End of Session PT - End of Session Equipment Utilized During Treatment: Gait belt Activity Tolerance: Patient tolerated treatment well Patient left: with call bell/phone within reach;in bed (sitting EOB; RN aware) Nurse Communication: Mobility status;Other (comment) (Discussed assistive device options with Unit Director)   GP     Van Clines [facility] [place], [place] [phone]  03/10/2012, 11:06 AM

## 2012-03-10 NOTE — Clinical Social Work Placement (Addendum)
Clinical Social Work Department CLINICAL SOCIAL WORK PLACEMENT NOTE 03/10/2012  Patient:  [name] [name], [name] [name]  Account Number:  [id number] Admit date:  03/05/2012  Clinical Social Worker:  Genelle Bal, LCSW  Date/time:  03/10/2012 05:50 AM  Clinical Social Work is seeking post-discharge placement for this patient at the following level of care:   SKILLED NURSING   (*CSW will update this form in Epic as items are completed)   03/10/2012  Patient/family provided with [facility] Department of Clinical Social Work's list of facilities offering this level of care within the geographic area requested by the patient (or if unable, by the patient's family).  03/10/2012  Patient/family informed of their freedom to choose among providers that offer the needed level of care, that participate in Medicare, Medicaid or managed care program needed by the patient, have an available bed and are willing to accept the patient.    Patient/family informed of [facility]' ownership interest in [facility], as well as of the fact that they are under no obligation to receive care at this facility.  PASARR submitted to EDS on  PASARR number received from EDS on   FL2 transmitted to all facilities in geographic area requested by pt/family on  03/10/2012 FL2 transmitted to all facilities within larger geographic area on   Patient informed that his/her managed care company has contracts with or will negotiate with  certain facilities, including the following:     Patient/family informed of bed offers received: 03/11/12  Patient chooses bed at  Physician recommends and patient chooses bed at    Patient to be discharged on 03/11/12   Patient to be transferred to facility by   The following physician request were entered in Epic:   Additional Comments: 03/10/12 - Patient has a prior PASARR. Patient is a letter of guarantee and CSW has been advised by [facility] and [facility] skilled  facilities that they cannot accept patient. CSW also fsent patient's information to [facility] SNF in [place]. 03/10/12 - Patient is a Letter of Guarantee as he does not have insurance. [facility] and [facility] skilled nursing facilities declined patient. 03/11/12 - Patient declined bed at facility in [place], indicating that the town [place]' and his food stamps are in [place].

## 2012-03-11 MED ORDER — THIAMINE HCL 100 MG PO TABS: 100.0000 mg | ORAL_TABLET | Freq: Every day | ORAL | Status: DC

## 2012-03-11 MED ORDER — THERA VITAL M PO TABS: 1.0000 | ORAL_TABLET | Freq: Every day | ORAL | Status: DC

## 2012-03-11 MED ORDER — FOLIC ACID 1 MG PO TABS: 1.0000 mg | ORAL_TABLET | Freq: Every day | ORAL | Status: DC

## 2012-03-11 NOTE — Discharge Summary (Signed)
Physician Discharge Summary  [name] [name] ZOX:[id number] DOB: 12/04/1950 DOA: 03/05/2012  PCP: Provider Not In System  Admit date: 03/05/2012 Discharge date: 03/11/2012  Time spent:40 minutes  Recommendations for Outpatient Follow-up:  Home with rolling walker. Patient refused for SNF.   Discharge Diagnoses:   Principal Problem:  *Generalized weakness  Active Problems:  Hypokalemia  ETOH abuse  HYPERTENSION  UTI (lower urinary tract infection)   Discharge Condition: Fair  Diet recommendation: Cardiac  Filed Weights   03/08/12 2038 03/09/12 2053 03/10/12 2025  Weight: 72.2 kg (159 lb 2.8 oz) 72.201 kg (159 lb 2.8 oz) 72.201 kg (159 lb 2.8 oz)    History of present illness:  Please refer to admission H&P for details but in brief  Hospital Course:   Hypokalemia  Likely in the setting of poor nutrition and dehydration he has received several runs of IV and by mouth KCl. His potassium finally improved. He is now off IV fluids  Alcohol abuse  Patient has been drinking about a pint of listerine everyday for a long time. Patient had an anion gap on presentation which is now closed. The new with thiamine folate and multivitamin. Counseled strongly on not to drink Listerine.  -No signs of alcohol withdrawal since admission. -I. will give him prescription for thiamine, folate and multivitamin.  Generalized weakness  Likely in the setting of heavy alcohol use and poor nutritional status. Physical therapy evaluated patient and he is unsteady to ambulate without support. He was however able to ambulate with a walker. It appears that he does have residual weakness from his previous stroke. He was recommended for SNF given his weakness however patient refused to go to SNF in [place] that was the only place willing to accept him because of his insurance issues. Patient is medically cleared for discharge however has been refusing to leave the hospital saying he needs an  ongoing physical therapy. It is not clear how he has been ambulating before coming to the hospital but does appear that is some instability from his previous stroke. Patient wasn't able to walk with a rolling walker and We will provide him with a walker once discharged from the hospital. [facility] since [facility] is refusing to go to SNF he will be discharged from the hospital today. He does not have any medical necessity to remain in the hospital.  UTI  Patient was treated with by mouth ciprofloxacin for 4 days. Urine culture showed mixed growth and patient was asymptomatic.   History of stroke Patient was on Plavix but I do not think he has been taking it at all. I strongly recommend [facility] one week.  Hypertension As above the patient is on amlodipine and lisinopril but he has not been taking his medications. [facility]   Procedures:  None  Consultations:  None  Discharge Exam: Filed Vitals:   03/10/12 1758 03/10/12 2025 03/11/12 0438 03/11/12 1000  BP: 134/75 122/79 121/79 111/68  Pulse: 87 88 77 78  Temp: 97.4 F (36.3 C) 98.3 F (36.8 C) 97.6 F (36.4 C) 97.6 F (36.4 C)  TempSrc: Oral Oral Oral Oral  Resp: 18 18 18 18   Height:  5\' 8"  (1.727 m)    Weight:  72.201 kg (159 lb 2.8 oz)    SpO2: 100% 98% 99% 99%    General: Elderly male in no acute distress HEENT: No pallor, moist  oral mucosa, no icterus Cardiovascular: Normal S1 and S2, no murmurs Respiratory: To auscultation bilaterally Extremities: Warm, no edema CNS: AAO x3, no tremors  Discharge Instructions     Medication List     As of 03/11/2012 12:17 PM    TAKE these medications         amLODipine 10 MG tablet   Commonly known as: NORVASC   Take 10 mg by mouth daily.      clopidogrel 75 MG tablet   Commonly known as: PLAVIX   Take 75 mg by mouth daily.      folic acid 1 MG tablet   Commonly known as:  FOLVITE   Take 1 tablet (1 mg total) by mouth daily.      lisinopril 10 MG tablet   Commonly known as: PRINIVIL,ZESTRIL   Take 1 tablet (10 mg total) by mouth daily.      multivitamin tablet   Take 1 tablet by mouth daily.      potassium chloride SA 20 MEQ tablet   Commonly known as: K-DUR,KLOR-CON   Take 1 tablet (20 mEq total) by mouth 2 (two) times daily.      thiamine 100 MG tablet   Take 1 tablet (100 mg total) by mouth daily.           Follow-up Information    Follow up with [facility]. Call in 1 week.   Contact information:   [phone] ( please call to schdule an appointment)          The results of significant diagnostics from this hospitalization (including imaging, microbiology, ancillary and laboratory) are listed below for reference.    Significant Diagnostic Studies: No results found.  Microbiology: Recent Results (from the past 240 hour(s))  URINE CULTURE     Status: Normal   Collection Time   03/06/12  1:49 AM      Component Value Range Status Comment   Specimen Description URINE, RANDOM   Final    Special Requests CX ADDED AT 0225 ON [id number]   Final    Culture  Setup Time 03/06/2012 08:34   Final    Colony Count >=100,000 COLONIES/ML   Final    Culture     Final    Value: Multiple bacterial morphotypes present, none predominant. Suggest appropriate recollection if clinically indicated.   Report Status 03/08/2012 FINAL   Final      Labs: Basic Metabolic Panel:  Lab 03/10/12 1191 03/07/12 0505 03/06/12 1025 03/06/12 0715 03/06/12 0305 03/06/12 0255 03/05/12 2158  NA 134* 138 -- 140 140 140 --  K 4.0 3.8 3.5 3.4* 2.5* -- --  CL 98 103 -- 101 99 100 --  CO2 29 24 -- 28 27 -- 26  GLUCOSE 92 89 -- 77 154* 150* --  BUN 14 5* -- 8 9 8  --  CREATININE 0.78 0.53 -- 0.55 0.57 0.80 --  CALCIUM 9.3 8.7 -- 8.3* 8.3* -- 8.8  MG -- -- -- -- 2.3 -- --  PHOS -- -- -- -- -- -- --   Liver Function Tests:  Lab 03/05/12 2158  AST 90*  ALT 39  ALKPHOS 76   BILITOT 0.3  PROT 7.0  ALBUMIN 3.6   No results found for this basename: LIPASE:5,AMYLASE:5 in the last 168 hours No results found for this basename: AMMONIA:5 in the last 168 hours CBC:  Lab 03/07/12 0505 03/06/12 0255 03/05/12 2158  WBC 4.3 -- 3.2*  NEUTROABS -- -- 1.7  HGB 12.5* 11.9*  13.4  HCT 37.5* 35.0* 38.9*  MCV 106.8* -- 105.7*  PLT 122* -- 125*   Cardiac Enzymes:  Lab 03/05/12 2158  CKTOTAL 71  CKMB --  CKMBINDEX --  TROPONINI --   BNP: BNP (last 3 results) No results found for this basename: PROBNP:3 in the last 8760 hours CBG: No results found for this basename: GLUCAP:5 in the last 168 hours     Signed:  Eddie North  [facility] 03/11/2012, 12:17 PM

## 2012-03-11 NOTE — Progress Notes (Signed)
Physical Therapy Treatment Patient Details Name: [name] [name] MRN: [id number] DOB: 1950-10-24 Today's Date: 03/11/2012 Time: 7829-5621 PT Time Calculation (min): 9 min  PT Assessment / Plan / Recommendation Comments on Treatment Session  Plan to d/c patient today. Was asked by case management to go over AD with patient. Pt refused RW that was provided to him. Discussed use of possible cane, but pt stated that he will not wait for cane to arrive prior to discharge. Pt educated on the importance of AD upon d/c, he is currently refusing    Follow Up Recommendations  SNF;Supervision/Assistance - 24 hour     Does the patient have the potential to tolerate intense rehabilitation     Barriers to Discharge        Equipment Recommendations  Other (comment);Rolling walker with 5" wheels    Recommendations for Other Services    Frequency Min 3X/week   Plan Discharge plan remains appropriate    Precautions / Restrictions Precautions Precautions: Fall Restrictions Weight Bearing Restrictions: No   Pertinent Vitals/Pain No complaints of pain. RN present during session    Mobility  Bed Mobility Bed Mobility: Not assessed Supine to Sit: 6: Modified independent (Device/Increase time) Sit to Supine: 6: Modified independent (Device/Increase time) Transfers Transfers: Stand to Sit;Sit to Stand Sit to Stand: 5: Supervision;With upper extremity assist;From bed Stand to Sit: 5: Supervision;With upper extremity assist;To chair/3-in-1 Details for Transfer Assistance: Cues for safe hand placement to/from bed. Slow transfer but no physical assist needed for initiatoin or support Ambulation/Gait Ambulation/Gait Assistance: 5: Supervision Ambulation Distance (Feet): 20 Feet Assistive device: None Ambulation/Gait Assistance Details: Observed pt walking around the room without AD. No LOB. Discussed RW vs cane with pt. See comments  Gait Pattern: Step-to pattern;Decreased hip/knee flexion -  right;Decreased hip/knee flexion - left;Trunk flexed;Narrow base of support Gait velocity: slow    Exercises General Exercises - Lower Extremity Long Arc Quad: AROM;Strengthening;Both;10 reps;Seated Hip Flexion/Marching: AROM;Strengthening;Both;10 reps;Seated   PT Diagnosis:    PT Problem List:   PT Treatment Interventions:     PT Goals Acute Rehab PT Goals PT Goal: Sit to Stand - Progress: Met PT Goal: Stand to Sit - Progress: Met PT Transfer Goal: Bed to Chair/Chair to Bed - Progress: Met PT Goal: Ambulate - Progress: Progressing toward goal  Visit Information  Last PT Received On: 03/11/12 Assistance Needed: +1    Subjective Data      Cognition  Overall Cognitive Status: Impaired Area of Impairment: Other (comment) Arousal/Alertness: Awake/alert Orientation Level: Appears intact for tasks assessed Behavior During Session: [facility] for tasks performed Cognition - Other Comments: Pt is aware of his balance issues but still believes that he can go out and use the bags for balance    Balance     End of Session PT - End of Session Equipment Utilized During Treatment: Gait belt Activity Tolerance: Patient tolerated treatment well Patient left: Other (comment) (with RN ready for discharge) Nurse Communication: Other (comment) (RN present)   GP     Milana Kidney 03/11/2012, 1:42 PM

## 2012-03-11 NOTE — Progress Notes (Signed)
Patient discharged to home. Patient AVS reviewed, copy of AVS and original prescriptions given to patient. Patient verbally acknowledged need for medications and follow-up appointments, while also verbalizing his likely non-compliance with medications and follow-up appointment. Importance of compliance reiterated to patient. Patient refused signing of hospital copy of AVS. Patient remains stable; no signs or symptoms of physiological distress.  Patient educated to return to the ER in cases of SOB, dizziness, fever, chest pain, or fainting.  Fayne Norrie, RN

## 2012-03-11 NOTE — Progress Notes (Signed)
Physical Therapy Treatment Patient Details Name: [name] [name] MRN: [id number] DOB: 12/31/1950 Today's Date: 03/11/2012 Time: 0865-7846 PT Time Calculation (min): 33 min  PT Assessment / Plan / Recommendation Comments on Treatment Session  Pt with improvements in strength and balance this session, although still requiring assist for balance as pt is at a high fall risk if d/c'ed back to the streets. Pt adamantly refusing AD as he needs to carry his bags. Spoke at length about possibly using a rolling suitcase for his belongings which will also double as a RW for increased support(pt contemplating). Pt refusing SNF as he states that the therapy here is better. Discussed with him that his frequency for therpay here is only 3x a week and he needs to continue ambulation and his leg exercises on the other days.     Follow Up Recommendations  SNF;Supervision/Assistance - 24 hour     Does the patient have the potential to tolerate intense rehabilitation     Barriers to Discharge        Equipment Recommendations  Other (comment);Rolling walker with 5" wheels    Recommendations for Other Services    Frequency Min 3X/week   Plan Discharge plan remains appropriate    Precautions / Restrictions Precautions Precautions: Fall Restrictions Weight Bearing Restrictions: No   Pertinent Vitals/Pain No pain this session.     Mobility  Bed Mobility Bed Mobility: Supine to Sit;Sit to Supine Supine to Sit: 6: Modified independent (Device/Increase time) Sit to Supine: 6: Modified independent (Device/Increase time) Transfers Transfers: Stand to Sit;Sit to Stand Sit to Stand: 4: Min guard;From bed;With upper extremity assist Stand to Sit: 4: Min guard;With upper extremity assist;To bed Details for Transfer Assistance: Cues for safe hand placement to/from bed. Slow transfer but no physical assist needed for initiatoin or support Ambulation/Gait Ambulation/Gait Assistance: 4: Min  assist Ambulation Distance (Feet): 200 Feet Assistive device: 1 person hand held assist;Other (Comment) (supoprt through trunk) Ambulation/Gait Assistance Details: Began ambulation with 1 person HHA for support on R as pt with L sided weakness this session with dramatic limp. NO LOB throughout this ambulation. Second half of trial, completed ambulation with only trunk support, again no loss of balance. Focused on more fluid gait pattern with cueing for feet width and upright posture. Discussed at length AD for d/c(see comments) Gait Pattern: Step-to pattern;Decreased hip/knee flexion - right;Decreased hip/knee flexion - left;Trunk flexed;Narrow base of support Gait velocity: slow    Exercises General Exercises - Lower Extremity Long Arc Quad: AROM;Strengthening;Both;10 reps;Seated Hip Flexion/Marching: AROM;Strengthening;Both;10 reps;Seated   PT Diagnosis:    PT Problem List:   PT Treatment Interventions:     PT Goals Acute Rehab PT Goals PT Goal: Sit to Stand - Progress: Progressing toward goal PT Goal: Stand to Sit - Progress: Progressing toward goal PT Transfer Goal: Bed to Chair/Chair to Bed - Progress: Progressing toward goal PT Goal: Ambulate - Progress: Progressing toward goal  Visit Information  Last PT Received On: 03/11/12 Assistance Needed: +1    Subjective Data      Cognition  Overall Cognitive Status: Impaired Area of Impairment: Other (comment) Arousal/Alertness: Awake/alert Orientation Level: Appears intact for tasks assessed Behavior During Session: [facility] for tasks performed Cognition - Other Comments: Pt is aware of his balance issues but still believes that he can go out and use the bags for balance    Balance     End of Session PT - End of Session Equipment Utilized During Treatment: Gait belt Activity Tolerance: Patient tolerated  treatment well Patient left: with call bell/phone within reach;in bed Nurse Communication: Mobility status;Other (comment) (need  for increased ambulation when not with therapy)   GP     Milana Kidney 03/11/2012, 10:15 AM

## 2012-04-07 ENCOUNTER — Emergency Department (HOSPITAL_COMMUNITY)
Admission: EM | Admit: 2012-04-07 | Discharge: 2012-04-07 | Disposition: A | Discharge status: Home / Self Care | Payer: Self-pay | Attending: Emergency Medicine | Admitting: Emergency Medicine

## 2012-04-07 ENCOUNTER — Emergency Department (HOSPITAL_COMMUNITY): Payer: Self-pay

## 2012-04-07 ENCOUNTER — Encounter (HOSPITAL_COMMUNITY): Payer: Self-pay | Admitting: Emergency Medicine

## 2012-04-07 DIAGNOSIS — I10 Essential (primary) hypertension: Secondary | ICD-10-CM | POA: Insufficient documentation

## 2012-04-07 DIAGNOSIS — Y939 Activity, unspecified: Secondary | ICD-10-CM | POA: Insufficient documentation

## 2012-04-07 DIAGNOSIS — E7800 Pure hypercholesterolemia, unspecified: Secondary | ICD-10-CM | POA: Insufficient documentation

## 2012-04-07 DIAGNOSIS — R296 Repeated falls: Secondary | ICD-10-CM | POA: Insufficient documentation

## 2012-04-07 DIAGNOSIS — S6980XA Other specified injuries of unspecified wrist, hand and finger(s), initial encounter: Secondary | ICD-10-CM | POA: Insufficient documentation

## 2012-04-07 DIAGNOSIS — Y9289 Other specified places as the place of occurrence of the external cause: Secondary | ICD-10-CM | POA: Insufficient documentation

## 2012-04-07 DIAGNOSIS — Z8673 Personal history of transient ischemic attack (TIA), and cerebral infarction without residual deficits: Secondary | ICD-10-CM | POA: Insufficient documentation

## 2012-04-07 DIAGNOSIS — S0100XA Unspecified open wound of scalp, initial encounter: Secondary | ICD-10-CM | POA: Insufficient documentation

## 2012-04-07 DIAGNOSIS — S6990XA Unspecified injury of unspecified wrist, hand and finger(s), initial encounter: Secondary | ICD-10-CM | POA: Insufficient documentation

## 2012-04-07 DIAGNOSIS — T07XXXA Unspecified multiple injuries, initial encounter: Secondary | ICD-10-CM

## 2012-04-07 DIAGNOSIS — IMO0002 Reserved for concepts with insufficient information to code with codable children: Secondary | ICD-10-CM | POA: Insufficient documentation

## 2012-04-07 DIAGNOSIS — F17200 Nicotine dependence, unspecified, uncomplicated: Secondary | ICD-10-CM | POA: Insufficient documentation

## 2012-04-07 DIAGNOSIS — Z23 Encounter for immunization: Secondary | ICD-10-CM | POA: Insufficient documentation

## 2012-04-07 MED ORDER — TETANUS-DIPHTH-ACELL PERTUSSIS 5-2.5-18.5 LF-MCG/0.5 IM SUSP
0.5000 mL | Freq: Once | INTRAMUSCULAR | Status: AC
  Administered 2012-04-07: 0.5 mL via INTRAMUSCULAR
  Filled 2012-04-07: qty 0.5

## 2012-04-07 MED ORDER — LIDOCAINE-EPINEPHRINE 2 %-1:100000 IJ SOLN
20.0000 mL | Freq: Once | INTRAMUSCULAR | Status: AC
  Filled 2012-04-07: qty 20

## 2012-04-07 NOTE — ED Notes (Signed)
Pt had un witnessed fall possibly down steps resulting in laceration over left eyebrow with hematoma and bruising, laceration to right thumb and c/o left shin pain. PMH HTN and CVA. Pt admits to ETOH and can not recollect events of last night past 1900 when he rode bus to the area he was found.

## 2012-04-07 NOTE — ED Notes (Signed)
Patient transported to X-ray 

## 2012-04-07 NOTE — ED Notes (Signed)
Pt in x-ray is the reason for delay on EKG

## 2012-04-07 NOTE — ED Provider Notes (Signed)
History     CSN: [id number]  Arrival date & time 04/07/12  0703   First MD Initiated Contact with Patient 04/07/12 [phone]      Chief Complaint  Patient presents with  . Fall  . Head Laceration    (Consider location/radiation/quality/duration/timing/severity/associated sxs/prior treatment) HPI Comments: Patient was brought in by EMS after sustaining a fall. He states that he had been drinking last night and was sleeping under the right MCA which is where he normally sleeps and was found by police. He states that he has a number or abdomen he had some blood on his head. He does remember falling or injuring himself. He's had a little bit of a cough recently but denies any other recent illnesses other than in December he was admitted for hypokalemia and malnutrition. He complains of some minor pain to his head where he fell and also pain to his right thumb. He describes as a constant throbbing pain. He's not sure when his last tetanus shot was. He denies any chest pain or shortness of breath. He denies any dizziness. Denies any neurologic deficits. Denies any fevers or other recent illnesses.  Patient is a 62 y.o. male presenting with fall and scalp laceration.  Fall Associated symptoms include headaches. Pertinent negatives include no fever, no numbness, no abdominal pain, no nausea, no vomiting and no hematuria.  Head Laceration Associated symptoms include headaches. Pertinent negatives include no chest pain, no abdominal pain and no shortness of breath.    Past Medical History  Diagnosis Date  . Hypercholesteremia   . Stroke   . Hypertension     History reviewed. No pertinent past surgical history.  No family history on file.  History  Substance Use Topics  . Smoking status: Current Every Day Smoker -- 0.2 packs/day    Types: Cigarettes  . Smokeless tobacco: Not on file  . Alcohol Use: Yes     Comment: drinks bottles of 1pint  mouthwash daily      Review of Systems    Constitutional: Negative for fever, chills, diaphoresis and fatigue.  HENT: Negative for congestion, rhinorrhea and sneezing.   Eyes: Negative.   Respiratory: Negative for cough, chest tightness and shortness of breath.   Cardiovascular: Negative for chest pain and leg swelling.  Gastrointestinal: Negative for nausea, vomiting, abdominal pain, diarrhea and blood in stool.  Genitourinary: Negative for frequency, hematuria, flank pain and difficulty urinating.  Musculoskeletal: Positive for arthralgias. Negative for back pain.  Skin: Positive for wound. Negative for rash.  Neurological: Positive for headaches. Negative for dizziness, speech difficulty, weakness and numbness.    Allergies  Review of patient's allergies indicates no known allergies.  Home Medications   No current outpatient prescriptions on file.  BP 128/104  Pulse 102  Temp 97.9 F (36.6 C) (Oral)  Resp 16  SpO2 99%  Physical Exam  Constitutional: He is oriented to person, place, and time. He appears well-developed and well-nourished.  HENT:  Head: Normocephalic.       Patient has a small laceration to his left scalp area just lateral to the eyebrow. He does not appear to have any ocular involvement. There no bony deficits noted.  Eyes: Pupils are equal, round, and reactive to light.  Neck: Normal range of motion. Neck supple.       Patient has some mild tenderness of the lower cervical spine but no tenderness to the thoracic or lumbosacral spine.  Cardiovascular: Normal rate, regular rhythm and normal heart sounds.  Pulmonary/Chest: Effort normal and breath sounds normal. No respiratory distress. He has no wheezes. He has no rales. He exhibits no tenderness.  Abdominal: Soft. Bowel sounds are normal. There is no tenderness. There is no rebound and no guarding.  Musculoskeletal: Normal range of motion. He exhibits no edema.       There is a small abrasion to the right proximal fibula. There is no bony tenderness  ROM is area. He also has an abrasion to his right, with some underlying tenderness. He also has some pain on palpation and range of motion the left hip. There is no other pain on palpation or range of motion extremities.  Lymphadenopathy:    He has no cervical adenopathy.  Neurological: He is alert and oriented to person, place, and time. He has normal strength. No cranial nerve deficit or sensory deficit. GCS eye subscore is 4. GCS verbal subscore is 5. GCS motor subscore is 6.  Skin: Skin is warm and dry. No rash noted.  Psychiatric: He has a normal mood and affect.    ED Course  LACERATION REPAIR Date/Time: 04/07/2012 10:52 AM Performed by: Leonette Tischer Authorized by: Rolan Bucco Consent: Verbal consent obtained. Risks and benefits: risks, benefits and alternatives were discussed Consent given by: patient Time out: Immediately prior to procedure a "time out" was called to verify the correct patient, procedure, equipment, support staff and site/side marked as required. Body area: head/neck Location details: forehead Laceration length: 1 cm Tendon involvement: none Nerve involvement: none Vascular damage: no Anesthesia: local infiltration Local anesthetic: lidocaine 2% with epinephrine Anesthetic total: 2 ml Patient sedated: no Preparation: Patient was prepped and draped in the usual sterile fashion. Irrigation solution: saline Irrigation method: syringe Amount of cleaning: standard Debridement: none Wound skin closure material used: 6-0 chromic gut. Number of sutures: 3 Technique: simple Approximation: close Approximation difficulty: simple Patient tolerance: Patient tolerated the procedure well with no immediate complications. Comments: Laceration repaired by PA student under my supervision.  Absorbable sutures used given pt is homeless and f/u is unlikely.   (including critical care time)  Results for orders placed during the hospital encounter of 03/05/12  CBC WITH  DIFFERENTIAL      Component Value Range   WBC 3.2 (*) 4.0 - 10.5 K/uL   RBC 3.68 (*) 4.22 - 5.81 MIL/uL   Hemoglobin 13.4  13.0 - 17.0 g/dL   HCT 16.1 (*) 09.6 - 04.5 %   MCV 105.7 (*) 78.0 - 100.0 fL   MCH 36.4 (*) 26.0 - 34.0 pg   MCHC 34.4  30.0 - 36.0 g/dL   RDW 40.9  81.1 - 91.4 %   Platelets 125 (*) 150 - 400 K/uL   Neutrophils Relative 55  43 - 77 %   Neutro Abs 1.7  1.7 - 7.7 K/uL   Lymphocytes Relative 35  12 - 46 %   Lymphs Abs 1.1  0.7 - 4.0 K/uL   Monocytes Relative 8  3 - 12 %   Monocytes Absolute 0.2  0.1 - 1.0 K/uL   Eosinophils Relative 3  0 - 5 %   Eosinophils Absolute 0.1  0.0 - 0.7 K/uL   Basophils Relative 1  0 - 1 %   Basophils Absolute 0.0  0.0 - 0.1 K/uL  COMPREHENSIVE METABOLIC PANEL      Component Value Range   Sodium 140  135 - 145 mEq/L   Potassium 2.7 (*) 3.5 - 5.1 mEq/L   Chloride 96  96 - 112  mEq/L   CO2 26  19 - 32 mEq/L   Glucose, Bld 74  70 - 99 mg/dL   BUN 9  6 - 23 mg/dL   Creatinine, Ser 1.61  0.50 - 1.35 mg/dL   Calcium 8.8  8.4 - 09.6 mg/dL   Total Protein 7.0  6.0 - 8.3 g/dL   Albumin 3.6  3.5 - 5.2 g/dL   AST 90 (*) 0 - 37 U/L   ALT 39  0 - 53 U/L   Alkaline Phosphatase 76  39 - 117 U/L   Total Bilirubin 0.3  0.3 - 1.2 mg/dL   GFR calc non Af Amer >90  >90 mL/min   GFR calc Af Amer >90  >90 mL/min  CK      Component Value Range   Total CK 71  7 - 232 U/L  ETHANOL      Component Value Range   Alcohol, Ethyl (B) 211 (*) 0 - 11 mg/dL  URINALYSIS, ROUTINE W REFLEX MICROSCOPIC      Component Value Range   Color, Urine YELLOW  YELLOW   APPearance CLEAR  CLEAR   Specific Gravity, Urine 1.014  1.005 - 1.030   pH 6.0  5.0 - 8.0   Glucose, UA NEGATIVE  NEGATIVE mg/dL   Hgb urine dipstick NEGATIVE  NEGATIVE   Bilirubin Urine NEGATIVE  NEGATIVE   Ketones, ur 15 (*) NEGATIVE mg/dL   Protein, ur NEGATIVE  NEGATIVE mg/dL   Urobilinogen, UA 0.2  0.0 - 1.0 mg/dL   Nitrite POSITIVE (*) NEGATIVE   Leukocytes, UA SMALL (*) NEGATIVE  URINE  RAPID DRUG SCREEN (HOSP PERFORMED)      Component Value Range   Opiates NONE DETECTED  NONE DETECTED   Cocaine NONE DETECTED  NONE DETECTED   Benzodiazepines NONE DETECTED  NONE DETECTED   Amphetamines NONE DETECTED  NONE DETECTED   Tetrahydrocannabinol NONE DETECTED  NONE DETECTED   Barbiturates NONE DETECTED  NONE DETECTED  URINE MICROSCOPIC-ADD ON      Component Value Range   Squamous Epithelial / LPF RARE  RARE   WBC, UA 7-10  <3 WBC/hpf   RBC / HPF 0-2  <3 RBC/hpf   Bacteria, UA MANY (*) RARE   Casts HYALINE CASTS (*) NEGATIVE  URINE CULTURE      Component Value Range   Specimen Description URINE, RANDOM     Special Requests CX ADDED AT 0225 ON [id number]     Culture  Setup Time 03/06/2012 08:34     Colony Count >=100,000 COLONIES/ML     Culture       Value: Multiple bacterial morphotypes present, none predominant. Suggest appropriate recollection if clinically indicated.   Report Status 03/08/2012 FINAL    POCT I-STAT, CHEM 8      Component Value Range   Sodium 140  135 - 145 mEq/L   Potassium 2.6 (*) 3.5 - 5.1 mEq/L   Chloride 100  96 - 112 mEq/L   BUN 8  6 - 23 mg/dL   Creatinine, Ser 8.11  0.50 - 1.35 mg/dL   Glucose, Bld 914 (*) 70 - 99 mg/dL   Calcium, Ion 7.82 (*) 1.13 - 1.30 mmol/L   TCO2 26  0 - 100 mmol/L   Hemoglobin 11.9 (*) 13.0 - 17.0 g/dL   HCT 95.6 (*) 21.3 - 08.6 %   Comment NOTIFIED PHYSICIAN    MAGNESIUM      Component Value Range   Magnesium 2.3  1.5 - 2.5  mg/dL  BASIC METABOLIC PANEL      Component Value Range   Sodium 140  135 - 145 mEq/L   Potassium 2.5 (*) 3.5 - 5.1 mEq/L   Chloride 99  96 - 112 mEq/L   CO2 27  19 - 32 mEq/L   Glucose, Bld 154 (*) 70 - 99 mg/dL   BUN 9  6 - 23 mg/dL   Creatinine, Ser 1.61  0.50 - 1.35 mg/dL   Calcium 8.3 (*) 8.4 - 10.5 mg/dL   GFR calc non Af Amer >90  >90 mL/min   GFR calc Af Amer >90  >90 mL/min  BASIC METABOLIC PANEL      Component Value Range   Sodium 140  135 - 145 mEq/L   Potassium 3.4 (*) 3.5 -  5.1 mEq/L   Chloride 101  96 - 112 mEq/L   CO2 28  19 - 32 mEq/L   Glucose, Bld 77  70 - 99 mg/dL   BUN 8  6 - 23 mg/dL   Creatinine, Ser 0.96  0.50 - 1.35 mg/dL   Calcium 8.3 (*) 8.4 - 10.5 mg/dL   GFR calc non Af Amer >90  >90 mL/min   GFR calc Af Amer >90  >90 mL/min  CBC      Component Value Range   WBC 4.3  4.0 - 10.5 K/uL   RBC 3.51 (*) 4.22 - 5.81 MIL/uL   Hemoglobin 12.5 (*) 13.0 - 17.0 g/dL   HCT 04.5 (*) 40.9 - 81.1 %   MCV 106.8 (*) 78.0 - 100.0 fL   MCH 35.6 (*) 26.0 - 34.0 pg   MCHC 33.3  30.0 - 36.0 g/dL   RDW 91.4  78.2 - 95.6 %   Platelets 122 (*) 150 - 400 K/uL  POTASSIUM      Component Value Range   Potassium 3.5  3.5 - 5.1 mEq/L  BASIC METABOLIC PANEL      Component Value Range   Sodium 138  135 - 145 mEq/L   Potassium 3.8  3.5 - 5.1 mEq/L   Chloride 103  96 - 112 mEq/L   CO2 24  19 - 32 mEq/L   Glucose, Bld 89  70 - 99 mg/dL   BUN 5 (*) 6 - 23 mg/dL   Creatinine, Ser 2.13  0.50 - 1.35 mg/dL   Calcium 8.7  8.4 - 08.6 mg/dL   GFR calc non Af Amer >90  >90 mL/min   GFR calc Af Amer >90  >90 mL/min  BASIC METABOLIC PANEL      Component Value Range   Sodium 134 (*) 135 - 145 mEq/L   Potassium 4.0  3.5 - 5.1 mEq/L   Chloride 98  96 - 112 mEq/L   CO2 29  19 - 32 mEq/L   Glucose, Bld 92  70 - 99 mg/dL   BUN 14  6 - 23 mg/dL   Creatinine, Ser 5.78  0.50 - 1.35 mg/dL   Calcium 9.3  8.4 - 46.9 mg/dL   GFR calc non Af Amer >90  >90 mL/min   GFR calc Af Amer >90  >90 mL/min   Dg Chest 2 View  04/07/2012  *RADIOLOGY REPORT*  Clinical Data: Fall and cough.  CHEST - 2 VIEW  Comparison: 04/13/2010  Findings: The lungs are clear.  No edema, infiltrate, pneumothorax or pleural fluid identified.  The heart size and mediastinal contours are within normal limits.  Bony thorax shows mild degenerative changes of the  thoracic spine.  IMPRESSION: No active disease.   Original Report Authenticated By: Irish Lack, M.D.    Dg Hip Complete Left  04/07/2012  *RADIOLOGY  REPORT*  Clinical Data: Fall with left hip pain.  LEFT HIP - COMPLETE 2+ VIEW  Comparison: 05/11/2010  Findings: No fracture, dislocation, bony lesion or soft tissue abnormalities identified.  No significant arthropathy present.  The bony pelvis is intact and normal in appearance.  IMPRESSION: Normal left hip.   Original Report Authenticated By: Irish Lack, M.D.    Ct Head Wo Contrast  04/07/2012  *RADIOLOGY REPORT*  Clinical Data:  Fall with left eyebrow laceration and hematoma with bruising.  CT HEAD WITHOUT CONTRAST CT CERVICAL SPINE WITHOUT CONTRAST  Technique:  Multidetector CT imaging of the head and cervical spine was performed following the standard protocol without intravenous contrast.  Multiplanar CT image reconstructions of the cervical spine were also generated.  Comparison:  05/11/2010.  CT HEAD  Findings: No evidence of acute infarct, acute hemorrhage, mass lesion, mass effect or hydrocephalus.  Atrophy.  Mild periventricular low attenuation.  Remote infarct in the posterior limb of the left internal capsule.  No air fluid levels in the visualized portions of the paranasal sinuses and mastoid air cells.  IMPRESSION:  1.  No acute renal abnormality. 2.  Atrophy and chronic microvascular white matter ischemic changes.  CT CERVICAL SPINE  Findings: Alignment is anatomic.  No fracture.  There are multilevel endplate degenerative changes with uncovertebral hypertrophy and facet sclerosis as well as loss of disc space height.  Findings are worst at C4-5 through C6-7.  At C3-4, moderate to severe left neural foraminal narrowing.  At C4-5, severe left, and moderate right, neural foraminal narrowing.  At C5-6, severe left, and moderate right neural foraminal narrowing.  At C6-7, severe bilateral neural foraminal narrowing.  At C7-T1, severe right neural foraminal narrowing.  Visualized lung apices show emphysema and scarring.  Soft tissues are unremarkable.  IMPRESSION:  1.  No evidence of acute  fracture or subluxation. 2.  Multilevel spondylosis.   Original Report Authenticated By: Leanna Battles, M.D.    Ct Cervical Spine Wo Contrast  04/07/2012  *RADIOLOGY REPORT*  Clinical Data:  Fall with left eyebrow laceration and hematoma with bruising.  CT HEAD WITHOUT CONTRAST CT CERVICAL SPINE WITHOUT CONTRAST  Technique:  Multidetector CT imaging of the head and cervical spine was performed following the standard protocol without intravenous contrast.  Multiplanar CT image reconstructions of the cervical spine were also generated.  Comparison:  05/11/2010.  CT HEAD  Findings: No evidence of acute infarct, acute hemorrhage, mass lesion, mass effect or hydrocephalus.  Atrophy.  Mild periventricular low attenuation.  Remote infarct in the posterior limb of the left internal capsule.  No air fluid levels in the visualized portions of the paranasal sinuses and mastoid air cells.  IMPRESSION:  1.  No acute renal abnormality. 2.  Atrophy and chronic microvascular white matter ischemic changes.  CT CERVICAL SPINE  Findings: Alignment is anatomic.  No fracture.  There are multilevel endplate degenerative changes with uncovertebral hypertrophy and facet sclerosis as well as loss of disc space height.  Findings are worst at C4-5 through C6-7.  At C3-4, moderate to severe left neural foraminal narrowing.  At C4-5, severe left, and moderate right, neural foraminal narrowing.  At C5-6, severe left, and moderate right neural foraminal narrowing.  At C6-7, severe bilateral neural foraminal narrowing.  At C7-T1, severe right neural foraminal narrowing.  Visualized lung apices  show emphysema and scarring.  Soft tissues are unremarkable.  IMPRESSION:  1.  No evidence of acute fracture or subluxation. 2.  Multilevel spondylosis.   Original Report Authenticated By: Leanna Battles, M.D.    Dg Finger Thumb Right  04/07/2012  *RADIOLOGY REPORT*  Clinical Data: Pain and laceration right thumb  RIGHT THUMB 2+V  Comparison: Right  hand radiographs 04/13/2010  Findings: Osseous mineralization normal. Joint spaces preserved. No acute fracture, dislocation or bone destruction.  IMPRESSION: No acute osseous abnormalities.   Original Report Authenticated By: Ulyses Southward, M.D.        Date: 04/07/2012  Rate: 79  Rhythm: normal sinus rhythm  QRS Axis: normal  Intervals: normal  ST/T Wave abnormalities: nonspecific ST/T changes  Conduction Disutrbances:none  Narrative Interpretation:   Old EKG Reviewed: unchanged    1. Laceration   2. Abrasions of multiple sites       MDM  Laceration was repaired. His tetanus shot was updated. Patient is completely alert and oriented. Was discharged in good condition. He was advised to return as needed.          0   Rolan Bucco, MD 04/07/12 1056

## 2012-04-19 ENCOUNTER — Encounter (HOSPITAL_COMMUNITY): Payer: Self-pay | Admitting: Emergency Medicine

## 2012-04-19 ENCOUNTER — Emergency Department (HOSPITAL_COMMUNITY)
Admission: EM | Admit: 2012-04-19 | Discharge: 2012-04-19 | Disposition: A | Discharge status: Home / Self Care | Payer: Self-pay | Attending: Emergency Medicine | Admitting: Emergency Medicine

## 2012-04-19 DIAGNOSIS — Z4802 Encounter for removal of sutures: Secondary | ICD-10-CM | POA: Insufficient documentation

## 2012-04-19 DIAGNOSIS — F17200 Nicotine dependence, unspecified, uncomplicated: Secondary | ICD-10-CM | POA: Insufficient documentation

## 2012-04-19 DIAGNOSIS — Z8639 Personal history of other endocrine, nutritional and metabolic disease: Secondary | ICD-10-CM | POA: Insufficient documentation

## 2012-04-19 DIAGNOSIS — I10 Essential (primary) hypertension: Secondary | ICD-10-CM | POA: Insufficient documentation

## 2012-04-19 DIAGNOSIS — Z862 Personal history of diseases of the blood and blood-forming organs and certain disorders involving the immune mechanism: Secondary | ICD-10-CM | POA: Insufficient documentation

## 2012-04-19 DIAGNOSIS — Z8673 Personal history of transient ischemic attack (TIA), and cerebral infarction without residual deficits: Secondary | ICD-10-CM | POA: Insufficient documentation

## 2012-04-19 NOTE — ED Notes (Signed)
Pt. Stated, here to remove sutures and recheck wound

## 2012-04-19 NOTE — ED Notes (Signed)
Pt here to have sutures removed from beside left eye.  No complaints voiced by pt.  Area clean and dry.

## 2012-04-19 NOTE — ED Notes (Signed)
Pt st's he needs to leave to catch a bus.  Sutures were removed but pt left before receiving discharge instructions

## 2012-04-19 NOTE — ED Provider Notes (Signed)
History     CSN: [id number]  Arrival date & time 04/19/12  1520   First MD Initiated Contact with Patient 04/19/12 1535      Chief Complaint  Patient presents with  . Wound Check    (Consider location/radiation/quality/duration/timing/severity/associated sxs/prior treatment) HPI  [name] [name] is a 62 y.o. male percent in for suture removal to left temple. Patient denies any issues with the wound including pain, discharge, poor wound healing, fever, nausea/vomiting.  Past Medical History  Diagnosis Date  . Hypercholesteremia   . Stroke   . Hypertension     History reviewed. No pertinent past surgical history.  No family history on file.  History  Substance Use Topics  . Smoking status: Current Every Day Smoker -- 0.2 packs/day    Types: Cigarettes  . Smokeless tobacco: Not on file  . Alcohol Use: Yes     Comment: drinks bottles of 1pint  mouthwash daily      Review of Systems  Constitutional: Negative for fever.  Respiratory: Negative for shortness of breath.   Cardiovascular: Negative for chest pain.  Gastrointestinal: Negative for nausea, vomiting, abdominal pain and diarrhea.  Skin: Positive for wound.  All other systems reviewed and are negative.    Allergies  Review of patient's allergies indicates no known allergies.  Home Medications   Current Outpatient Rx  Name  Route  Sig  Dispense  Refill  . NORVASC PO   Oral   Take 1 tablet by mouth daily.         Marland Kitchen CLOPIDOGREL BISULFATE 75 MG PO TABS   Oral   Take 75 mg by mouth daily.         Marland Kitchen LISINOPRIL PO   Oral   Take 1 tablet by mouth daily.         . ADULT MULTIVITAMIN W/MINERALS CH   Oral   Take 1 tablet by mouth daily.           BP 170/104  Pulse 110  Temp 98.2 F (36.8 C) (Oral)  Resp 16  SpO2 97%  Physical Exam  Nursing note and vitals reviewed. Constitutional: He is oriented to person, place, and time. He appears well-developed and well-nourished. No distress.   HENT:  Head: Normocephalic.  Eyes: Conjunctivae normal and EOM are normal. Pupils are equal, round, and reactive to light.  Cardiovascular: Normal rate.   Pulmonary/Chest: Effort normal. No stridor.  Abdominal: Soft.  Musculoskeletal: Normal range of motion.  Neurological: He is alert and oriented to person, place, and time.  Skin:       Well healing small laceration to the left forehead. 1 absorbable suture in place. Clean dry and intact. No erythema, discharge, induration, tenderness to palpation, warmth.  Psychiatric: He has a normal mood and affect.    ED Course  Procedures (including critical care time)  SUTURE REMOVAL Performed by: Wynetta Emery  Consent: Verbal consent obtained. Patient identity confirmed: provided demographic data Time out: Immediately prior to procedure a "time out" was called to verify the correct patient, procedure, equipment, support staff and site/side marked as required.  Location details: left temple  Wound Appearance: clean  Sutures/Staples Removed: 1  Facility: sutures placed in this facility Patient tolerance: Patient tolerated the procedure well with no immediate complications.   Labs Reviewed - No data to display No results found.   1. Visit for suture removal       MDM   One suture removed without incident. Wound is healing well with  no signs of infection. Patient awoke because he said he had to catch his bus, before discussion of his blood pressure could be undertaken.   Pt verbalized understanding and agrees with care plan. Outpatient follow-up and return precautions given.             Wynetta Emery, PA-C 04/20/12 1942

## 2012-04-21 ENCOUNTER — Emergency Department (HOSPITAL_COMMUNITY): Payer: Self-pay

## 2012-04-21 ENCOUNTER — Encounter (HOSPITAL_COMMUNITY): Payer: Self-pay | Admitting: Emergency Medicine

## 2012-04-21 ENCOUNTER — Inpatient Hospital Stay (HOSPITAL_COMMUNITY)
Admission: EM | Admit: 2012-04-21 | Discharge: 2012-04-23 | DRG: 101 | Disposition: A | Discharge status: Home / Self Care | Payer: MEDICAID | Attending: Internal Medicine | Admitting: Internal Medicine

## 2012-04-21 DIAGNOSIS — F1010 Alcohol abuse, uncomplicated: Secondary | ICD-10-CM | POA: Diagnosis present

## 2012-04-21 DIAGNOSIS — I10 Essential (primary) hypertension: Secondary | ICD-10-CM

## 2012-04-21 DIAGNOSIS — Z5900 Homelessness unspecified: Secondary | ICD-10-CM

## 2012-04-21 DIAGNOSIS — D696 Thrombocytopenia, unspecified: Secondary | ICD-10-CM

## 2012-04-21 DIAGNOSIS — F17200 Nicotine dependence, unspecified, uncomplicated: Secondary | ICD-10-CM

## 2012-04-21 DIAGNOSIS — R569 Unspecified convulsions: Principal | ICD-10-CM | POA: Diagnosis present

## 2012-04-21 DIAGNOSIS — E7800 Pure hypercholesterolemia, unspecified: Secondary | ICD-10-CM | POA: Diagnosis present

## 2012-04-21 DIAGNOSIS — R531 Weakness: Secondary | ICD-10-CM

## 2012-04-21 DIAGNOSIS — R4182 Altered mental status, unspecified: Secondary | ICD-10-CM | POA: Diagnosis present

## 2012-04-21 DIAGNOSIS — Z8673 Personal history of transient ischemic attack (TIA), and cerebral infarction without residual deficits: Secondary | ICD-10-CM

## 2012-04-21 DIAGNOSIS — Z79899 Other long term (current) drug therapy: Secondary | ICD-10-CM

## 2012-04-21 DIAGNOSIS — I6350 Cerebral infarction due to unspecified occlusion or stenosis of unspecified cerebral artery: Secondary | ICD-10-CM

## 2012-04-21 HISTORY — DX: Unspecified convulsions: R56.9

## 2012-04-21 LAB — DIFFERENTIAL
Basophils Absolute: 0 10*3/uL (ref 0.0–0.1)
Lymphs Abs: 0.7 10*3/uL (ref 0.7–4.0)
Monocytes Absolute: 0.4 10*3/uL (ref 0.1–1.0)

## 2012-04-21 LAB — COMPREHENSIVE METABOLIC PANEL
AST: 37 U/L (ref 0–37)
Albumin: 3.7 g/dL (ref 3.5–5.2)
CO2: 23 mEq/L (ref 19–32)
Calcium: 8.6 mg/dL (ref 8.4–10.5)
Creatinine, Ser: 0.67 mg/dL (ref 0.50–1.35)
GFR calc non Af Amer: >90 mL/min (ref >90)

## 2012-04-21 LAB — POCT I-STAT, CHEM 8
Calcium, Ion: 1.07 mmol/L — ABNORMAL LOW (ref 1.13–1.30)
Chloride: 101 mEq/L (ref 96–112)
Creatinine, Ser: 0.9 mg/dL (ref 0.50–1.35)
Glucose, Bld: 184 mg/dL — ABNORMAL HIGH (ref 70–99)
HCT: 40 % (ref 39.0–52.0)

## 2012-04-21 LAB — CBC
MCH: 37 pg — ABNORMAL HIGH (ref 26.0–34.0)
MCHC: 33.6 g/dL (ref 30.0–36.0)
MCV: 110.3 fL — ABNORMAL HIGH (ref 78.0–100.0)
Platelets: 134 10*3/uL — ABNORMAL LOW (ref 150–400)
RDW: 14.4 % (ref 11.5–15.5)

## 2012-04-21 LAB — PHOSPHORUS: Phosphorus: 2.3 mg/dL (ref 2.3–4.6)

## 2012-04-21 LAB — PROTIME-INR: Prothrombin Time: 13.2 seconds (ref 11.6–15.2)

## 2012-04-21 LAB — BASIC METABOLIC PANEL
BUN: 8 mg/dL (ref 6–23)
Chloride: 97 mEq/L (ref 96–112)
GFR calc Af Amer: >90 mL/min (ref >90)
Potassium: 3.7 mEq/L (ref 3.5–5.1)

## 2012-04-21 LAB — CK: Total CK: 103 U/L (ref 7–232)

## 2012-04-21 LAB — MRSA PCR SCREENING: MRSA by PCR: NEGATIVE

## 2012-04-21 LAB — ETHANOL: Alcohol, Ethyl (B): <11 mg/dL (ref 0–11)

## 2012-04-21 MED ORDER — SODIUM CHLORIDE 0.9 % IV SOLN
500.0000 mg | Freq: Two times a day (BID) | INTRAVENOUS | Status: DC
  Administered 2012-04-21 – 2012-04-22 (×3): 500 mg via INTRAVENOUS
  Filled 2012-04-21 (×7): qty 5

## 2012-04-21 MED ORDER — LORAZEPAM 2 MG/ML IJ SOLN
INTRAMUSCULAR | Status: AC
  Filled 2012-04-21: qty 1

## 2012-04-21 MED ORDER — SODIUM CHLORIDE 0.9 % IJ SOLN: 3.0000 mL | Freq: Two times a day (BID) | INTRAMUSCULAR | Status: DC

## 2012-04-21 MED ORDER — ONDANSETRON HCL 4 MG PO TABS: 4.0000 mg | ORAL_TABLET | Freq: Four times a day (QID) | ORAL | Status: DC | PRN

## 2012-04-21 MED ORDER — HYDRALAZINE HCL 20 MG/ML IJ SOLN: 10.0000 mg | INTRAMUSCULAR | Status: DC | PRN

## 2012-04-21 MED ORDER — ONDANSETRON HCL 4 MG/2ML IJ SOLN: 4.0000 mg | Freq: Four times a day (QID) | INTRAMUSCULAR | Status: DC | PRN

## 2012-04-21 MED ORDER — FOLIC ACID 1 MG PO TABS
1.0000 mg | ORAL_TABLET | Freq: Every day | ORAL | Status: DC
  Administered 2012-04-21 – 2012-04-23 (×3): 1 mg via ORAL
  Filled 2012-04-21 (×3): qty 1

## 2012-04-21 MED ORDER — SODIUM CHLORIDE 0.9 % IV SOLN
INTRAVENOUS | Status: DC
  Administered 2012-04-21: 20:00:00 via INTRAVENOUS

## 2012-04-21 MED ORDER — LORAZEPAM 2 MG/ML IJ SOLN
1.0000 mg | Freq: Once | INTRAMUSCULAR | Status: AC
  Administered 2012-04-21: 1 mg via INTRAVENOUS

## 2012-04-21 MED ORDER — ADULT MULTIVITAMIN W/MINERALS CH
1.0000 | ORAL_TABLET | Freq: Every day | ORAL | Status: DC
  Administered 2012-04-21 – 2012-04-23 (×3): 1 via ORAL
  Filled 2012-04-21 (×3): qty 1

## 2012-04-21 MED ORDER — SODIUM CHLORIDE 0.9 % IV SOLN
1000.0000 mg | INTRAVENOUS | Status: AC
  Administered 2012-04-21: 1000 mg via INTRAVENOUS
  Filled 2012-04-21: qty 10

## 2012-04-21 MED ORDER — ENOXAPARIN SODIUM 40 MG/0.4ML ~~LOC~~ SOLN
40.0000 mg | SUBCUTANEOUS | Status: DC
  Administered 2012-04-21 – 2012-04-22 (×2): 40 mg via SUBCUTANEOUS
  Filled 2012-04-21 (×3): qty 0.4

## 2012-04-21 MED ORDER — VITAMIN B-1 100 MG PO TABS
100.0000 mg | ORAL_TABLET | Freq: Every day | ORAL | Status: DC
  Administered 2012-04-22 – 2012-04-23 (×2): 100 mg via ORAL
  Filled 2012-04-21 (×3): qty 1

## 2012-04-21 MED ORDER — LORAZEPAM 2 MG/ML IJ SOLN: 1.0000 mg | Freq: Four times a day (QID) | INTRAMUSCULAR | Status: DC | PRN

## 2012-04-21 MED ORDER — LORAZEPAM 1 MG PO TABS
1.0000 mg | ORAL_TABLET | Freq: Four times a day (QID) | ORAL | Status: DC | PRN
  Administered 2012-04-23: 1 mg via ORAL
  Filled 2012-04-21: qty 1

## 2012-04-21 MED ORDER — THIAMINE HCL 100 MG/ML IJ SOLN
100.0000 mg | Freq: Every day | INTRAMUSCULAR | Status: DC
  Administered 2012-04-21: 100 mg via INTRAVENOUS
  Filled 2012-04-21 (×3): qty 1

## 2012-04-21 NOTE — ED Notes (Signed)
admiting MD to bedside

## 2012-04-21 NOTE — ED Provider Notes (Signed)
Medical screening examination/treatment/procedure(s) were performed by non-physician practitioner and as supervising physician I was immediately available for consultation/collaboration. Ivor Kishi, MD, FACEP   Katonya Blecher L Philomene Haff, MD 04/21/12 1949 

## 2012-04-21 NOTE — Progress Notes (Signed)
EEG completed.

## 2012-04-21 NOTE — ED Notes (Signed)
Called MRI to check on pt's exam, staff sts pt may be able to go after current study

## 2012-04-21 NOTE — ED Notes (Signed)
EEG tech at bedside. 

## 2012-04-21 NOTE — Procedures (Signed)
EEG report.  Brief clinical history: 62 years old male with a history of alcohol abuse, prior stroke with residual right sided weakness, brought to the ED after sustaining a cluster of seizures at the bus stop earlier today. Had further seizures in the ED. Loaded with IV keppra and IV ativan. Technique: this is a 17 channel routine scalp EEG performed at the bedside with bipolar and monopolar montages arranged in accordance to the international 10/20 system of electrode placement. One channel was dedicated to EKG recording.  The study was performed predominantly during wakefulness and drowsiness.  No activating procedures employed during the test.  Description:In the wakeful state, the best background consisted of a medium amplitude 9 Hz rhythm, posterior dominant, poorly sustained, symmetric, and reactive. Drowsiness demonstrated attenuation of the alpha rhythm. No focal or generalized epileptiform discharges noted.  No pathologic areas of slowing seen.  EKG showed sinus rhythm.  Impression: this is a normal awake and drowsy EEG Please, be aware that a normal EEG does not exclude the possibility of epilepsy.  Clinical correlation is advised.  Wyatt Portela, MD

## 2012-04-21 NOTE — ED Notes (Signed)
Code stroke canceled at 1030 since no LKW time.

## 2012-04-21 NOTE — ED Provider Notes (Signed)
History     CSN: [id number]  Arrival date & time 04/21/12  1020   First MD Initiated Contact with Patient 04/21/12 1030      No chief complaint on file. level 5 caveat, patient unable to speak  (Consider location/radiation/quality/duration/timing/severity/associated sxs/prior treatment) HPI  62 y.o. Male with history of etoh abuse, homeless, reported with decreased loc, ems arrived and patient appeared postictal.  Patient with history of prior stroke with residual right sided weakness.  Patient had gm seizure en route x 2 per ems report.  Patient developed right gaze and left hand weakness.  EMS reports he appearred to move all extremities on their initial assessment and now seems to have gaze palsy and upper extremity weakness- patient made code stroke and proceeded to scanner.  10:45 AM STroke team at bedside and code stroke canceled due to unclear last known well.   Received call from Dr. Molli Posey that patient had prior stroke but no clear new abnormalities.   Past Medical History  Diagnosis Date  . Hypercholesteremia   . Stroke   . Hypertension     No past surgical history on file.  No family history on file.  History  Substance Use Topics  . Smoking status: Current Every Day Smoker -- 0.2 packs/day    Types: Cigarettes  . Smokeless tobacco: Not on file  . Alcohol Use: Yes     Comment: drinks bottles of 1pint  mouthwash daily      Review of Systems  Unable to perform ROS   Allergies  Review of patient's allergies indicates no known allergies.  Home Medications   Current Outpatient Rx  Name  Route  Sig  Dispense  Refill  . NORVASC PO   Oral   Take 1 tablet by mouth daily.         Marland Kitchen CLOPIDOGREL BISULFATE 75 MG PO TABS   Oral   Take 75 mg by mouth daily.         Marland Kitchen LISINOPRIL PO   Oral   Take 1 tablet by mouth daily.         . ADULT MULTIVITAMIN W/MINERALS CH   Oral   Take 1 tablet by mouth daily.           There were no vitals taken for  this visit.  Physical Exam  Nursing note and vitals reviewed. Constitutional: He appears well-developed and well-nourished.  HENT:       Gaze to right with left side deficit.   Eyes: Pupils are equal, round, and reactive to light.  Neck: Normal range of motion. Neck supple.  Cardiovascular: Tachycardia present.   Pulmonary/Chest: Effort normal and breath sounds normal.  Abdominal: Soft. Bowel sounds are normal.  Neurological: He is alert.       Patient with flaccid lue, and unable to move [place], rue with tone, rle with good movement against gravity.  Patient answered questions, then began having jerking movement c.w. Seizure.     ED Course  Procedures (including critical care time)   Labs Reviewed  ETHANOL  PROTIME-INR  APTT  CBC  DIFFERENTIAL  COMPREHENSIVE METABOLIC PANEL  TROPONIN I  URINE RAPID DRUG SCREEN (HOSP PERFORMED)  URINALYSIS, ROUTINE W REFLEX MICROSCOPIC   No results found.   No diagnosis found.    MDM  Code stroke canceled due to unclear lkw due to patient's homeless status and no friends or family with patient.  Patient with repeat seizure and ativan 1 mg ordered.  Keppra one gram  ordered by neurology.    Patient having eeg done per neurology.     Patient care discussed with Dr. Manson Passey on call for unassigned medicine.     CRITICAL CARE Performed by: Hilario Quarry   Total critical care time: 50  Critical care time was exclusive of separately billable procedures and treating other patients.  Critical care was necessary to treat or prevent imminent or life-threatening deterioration.  Critical care was time spent personally by me on the following activities: development of treatment plan with patient and/or surrogate as well as nursing, discussions with consultants, evaluation of patient's response to treatment, examination of patient, obtaining history from patient or surrogate, ordering and performing treatments and interventions, ordering and  review of laboratory studies, ordering and review of radiographic studies, pulse oximetry and re-evaluation of patient's condition.   Hilario Quarry, MD 04/21/12 1355

## 2012-04-21 NOTE — Consult Note (Signed)
NEURO HOSPITALIST CONSULT NOTE    Reason for Consult: Code stroke  HPI:                                                                                                                                          [name] [name] is an 62 y.o. male Who was witnessed seizing by others at the bus stop.  EMS witness 2 seizures which included right sided increased tone, right eye deviation.  Patient was brought to [facility] hospital and given 5 mg Versed in route. Initial CT head was negative for acute bleed or stroke. Due to unknown LSN Code stroke was canceled. Per notes patient does have known left thalamic infarct back in 2011. Most recent hospitalization was for dehydration and possible ETOH withdrawal.  Per note, he had been drinking about one pint of Listerine for a long time. When asked if had stopped drinking Listerine he states "he did not and recently has been drinking about a pint a day". Currently he is able to follow simple commands only.   Past Medical History  Diagnosis Date  . Hypercholesteremia   . Stroke   . Hypertension     No past surgical history on file.  No family history on file.  Family History: Not attainable  Social History:  reports that he has been smoking Cigarettes.  He has been smoking about .25 packs per day. He does not have any smokeless tobacco history on file. He reports that he drinks alcohol. He reports that he does not use illicit drugs.  No Known Allergies  MEDICATIONS:                                                                                                                     Current Facility-Administered Medications  Medication Dose Route Frequency Provider Last Rate Last Dose  . levETIRAcetam (KEPPRA) 1,000 mg in sodium chloride 0.9 % 100 mL IVPB  1,000 mg Intravenous STAT Ulice Dash, PA      . levETIRAcetam (KEPPRA) 500 mg in sodium chloride 0.9 % 100 mL IVPB  500 mg Intravenous Q12H Ulice Dash, PA       Current  Outpatient Prescriptions  Medication Sig Dispense Refill  . AmLODIPine Besylate (NORVASC PO) Take 1 tablet by mouth  daily.      . clopidogrel (PLAVIX) 75 MG tablet Take 75 mg by mouth daily.      Marland Kitchen LISINOPRIL PO Take 1 tablet by mouth daily.      . Multiple Vitamin (MULTIVITAMIN WITH MINERALS) TABS Take 1 tablet by mouth daily.         ROS:                                                                                                                                       History obtained from unobtainable from patient due to mental status     Blood pressure 192/119, pulse 119, resp. rate 16, SpO2 100.00%.   Neurologic Examination:                                                                                                      Mental Status: Alert, not oriented, able to follow simple commands. Speech fluent without evidence of aphasia.   Cranial Nerves: II: Discs flat bilaterally; blinks to threat bilaterally, pupils equal, round, reactive to light and accommodation III,IV, VI: ptosis not present, patient stares straight forward but oculocephalic eye movement intact.  V,VII: smile asymmetric with left lower face facial droop. facial light touch sensation normal bilaterally VIII: hearing normal bilaterally IX,X: gag reflex present XI: Shoulder shrug decreased on the left XII: midline tongue extension Motor: Right : Upper extremity   5/5    Left:     Upper extremity   1/5  Lower extremity   5/5     Lower extremity   0/5 Tone and bulk:normal tone throughout; no atrophy noted Sensory: Pinprick and light touch intact throughout, bilaterally but neglects the left side to DSS.  Deep Tendon Reflexes: 2+ and symmetric throughout bilateral UE and KJ. 1+ AJ bilaterally.  Plantars: Mute bilaterally Cerebellar: normal finger-to-nose normal on the left.  normal heel-to-shin test on the right CV: pulses palpable throughout     Lab Results  Component Value Date/Time   CHOL  Value: 152         ATP III CLASSIFICATION:  <200     mg/dL   Desirable  161-096  mg/dL   Borderline High  >=045    mg/dL   High        40/98/1191  5:00 AM    Results for orders placed during the hospital encounter of 04/21/12 (from the past 48 hour(s))  POCT I-STAT, CHEM 8     Status: Abnormal   Collection  Time   04/21/12 10:43 AM      Component Value Range Comment   Sodium 136  135 - 145 mEq/L    Potassium 3.7  3.5 - 5.1 mEq/L    Chloride 101  96 - 112 mEq/L    BUN 8  6 - 23 mg/dL    Creatinine, Ser 1.61  0.50 - 1.35 mg/dL    Glucose, Bld 096 (*) 70 - 99 mg/dL    Calcium, Ion 0.45 (*) 1.13 - 1.30 mmol/L    TCO2 24  0 - 100 mmol/L    Hemoglobin 13.6  13.0 - 17.0 g/dL    HCT 40.9  81.1 - 91.4 %     Ct Head Wo Contrast  04/21/2012  *RADIOLOGY REPORT*  Clinical Data: Left-sided weakness.  CT HEAD WITHOUT CONTRAST  Technique:  Contiguous axial images were obtained from the base of the skull through the vertex without contrast.  Comparison: 04/07/2012  Findings: There is no evidence for acute hemorrhage, hydrocephalus, mass lesion, or abnormal extra-axial fluid collection.  No definite CT evidence for acute infarction.  Patchy low attenuation in the deep hemispheric and periventricular white matter is nonspecific, but likely reflects chronic microvascular ischemic demyelination. Lacunar infarcts are seen in the left basal ganglia and left pons, as before. The visualized paranasal sinuses and mastoid air cells are clear.  IMPRESSION: Stable.  No acute intracranial abnormality.  I personally called the results of the study to Dr. Rosalia Hammers in the emergency department at 1036 hours on 04/21/2012.   Original Report Authenticated By: Kennith Center, M.D.      Assessment/Plan: 62 YO male with known ETOH, history of left thalamic stroke now presenting with left facial droop, left sided weakness associated with seizure activity. Code stroke was canceled due to unknown LSN. Differential at this time includes right parietal CVA  with associated seizure activity versus seizure with left sided Todd's paralysis.   Recommend: 1) Stat EEG--in progress 2) MRI brain--further stroke workup if MRI shows CVA 3) IV load Keppra 1 gram followed by 500 mg BID 4) medical team to follow electrolyte abnormalities and BP management.   Assessment and plan discussed with with attending physician and they are in agreement.    Felicie Morn PA-C [facility] [phone]  04/21/2012, 10:49 AM Patient seen and examined together with physician assistant and I concur with the assessment and plan.  Wyatt Portela, MD

## 2012-04-21 NOTE — ED Notes (Signed)
Returns from MRI

## 2012-04-21 NOTE — ED Notes (Signed)
Pt sts he has seizures when he does not drink, sts last drink was "a few days ago" and that he has been drinking "a lot" - mainly mouth wash

## 2012-04-21 NOTE — H&P (Signed)
Hospital Admission Note Date: 04/21/2012  Patient name: [name] [name] Medical record number: [id number] Date of birth: Aug 07, 1950 Age: 62 y.o. Gender: male PCP: Provider Not In System  _________________________________________________________ INTERNAL MEDICINE TEACHING SERVICE CONTACT INFO     Weekday Hours (7AM-5PM): ** If no return call within 15 minutes (after trying both pagers listed below), please call after hours pagers.    First Contact:  Dr. Collier Bullock   Pager:  [phone] Second Contact:   Dr. Manson Passey   Pager:  [phone]      After Hours (after 5PM)/ Weekend / Holidays: First Contact:              Pager: [phone] Second Contact:         Pager: [phone] _________________________________________________________    Chief Complaint: Seizure  History of Present Illness: [name] [name] is a 62 year old male with a history of alcohol abuse, homelessness, hypertension, left thalamic CVA in 2011. He presents after having a witnessed seizure at the bus stop this morning.   Patient is only intermittently answering questions, so much of the history is obtained from the medical record. According to the chart, EMS witnessed 2 seizures with right-sided increased tonicity and right eye deviation. He was given 5 mg of Versed and in the ambulance. After arrival, he was noted to have some left-sided weakness. Code stroke was called and neurology evaluated the patient in ED. He was given a Keppra load, EEG.  Patient states that he drinks about a pint of mouthwash per day, states he has been doing this for about a month. His last drink was today according to the patient. He denies drinking any other type of alcohol including rubbing alcohol or anti-freeze.   He denies any hematemesis, weakness, numbness, headache, abdominal pain.    Meds: Current Outpatient Rx  Name  Route  Sig  Dispense  Refill  . NORVASC PO   Oral   Take 1 tablet by mouth daily.         Marland Kitchen CLOPIDOGREL BISULFATE 75 MG PO  TABS   Oral   Take 75 mg by mouth daily.         Marland Kitchen LISINOPRIL PO   Oral   Take 1 tablet by mouth daily.         . ADULT MULTIVITAMIN W/MINERALS CH   Oral   Take 1 tablet by mouth daily.           Allergies: Allergies as of 04/21/2012  . (No Known Allergies)   Past Medical History  Diagnosis Date  . Hypercholesteremia   . Stroke   . Hypertension   . Seizures    History reviewed. No pertinent past surgical history. No family history on file. History   Social History  . Marital Status: Divorced    Spouse Name: N/A    Number of Children: N/A  . Years of Education: N/A   Occupational History  . Not on file.   Social History Main Topics  . Smoking status: Current Every Day Smoker -- 0.2 packs/day    Types: Cigarettes  . Smokeless tobacco: Not on file  . Alcohol Use: Yes     Comment: drinks bottles of 1pint  mouthwash daily  . Drug Use: No  . Sexually Active: Not Currently   Other Topics Concern  . Not on file   Social History Narrative  . No narrative on file    Review of Systems: Review of systems  Physical Exam Blood pressure 157/98, pulse 94, temperature 98.8 F (  37.1 C), temperature source Rectal, resp. rate 16, SpO2 99.00%. General:  Resting in fetal position on the stretcher, eyes closed, intermittently responds to questions with much prompting HEENT:  PERRL, 3mm, EOMI, moist mucous membranes Cardiovascular:  Rate 90s, irregular rhythm, no murmurs, rubs or gallops Respiratory:  Clear to auscultation bilaterally, no wheezes, rales, or rhonchi Abdomen:  Soft, nondistended, mild tenderness in epigastric region, bowel sounds present Extremities:  Warm and well-perfused, no edema.  Skin: Warm, dry, no rashes Neuro: alert and oriented to person, hospital, year. Moving all 4 extremities R>L. Sleepy, follows some simple commands with much prompting but mostly uncooperative with exam.   Lab results: Basic Metabolic Panel:  Basename 04/21/12 1155  04/21/12 1043 04/21/12 1034  NA -- 136 136  K -- 3.7 3.6  CL -- 101 98  CO2 -- -- 23  GLUCOSE -- 184* 183*  BUN -- 8 9  CREATININE -- 0.90 0.67  CALCIUM -- -- 8.6  MG -- -- --  PHOS 2.3 -- --   Liver Function Tests:  Basename 04/21/12 1034  AST 37  ALT 26  ALKPHOS 74  BILITOT 0.9  PROT 6.9  ALBUMIN 3.7    Basename 04/21/12 1155  AMMONIA 40   CBC:  Basename 04/21/12 1043 04/21/12 1034  WBC -- 3.9*  NEUTROABS -- 2.7  HGB 13.6 13.0  HCT 40.0 38.7*  MCV -- 110.3*  PLT -- 134*   Cardiac Enzymes:  Basename 04/21/12 1034  CKTOTAL --  CKMB --  CKMBINDEX --  TROPONINI <0.30   Coagulation:  Basename 04/21/12 1034  LABPROT 13.2  INR 1.01   Urine Drug Screen: Drugs of Abuse     Component Value Date/Time   LABOPIA NONE DETECTED 03/06/2012 0149   LABOPIA NEGATIVE 12/30/2009 1339   COCAINSCRNUR NONE DETECTED 03/06/2012 0149   COCAINSCRNUR NEGATIVE 12/30/2009 1339   LABBENZ NONE DETECTED 03/06/2012 0149   LABBENZ NEGATIVE 12/30/2009 1339   AMPHETMU NONE DETECTED 03/06/2012 0149   AMPHETMU NEGATIVE 12/30/2009 1339   THCU NONE DETECTED 03/06/2012 0149   LABBARB NONE DETECTED 03/06/2012 0149    Alcohol Level:  Basename 04/21/12 1034  ETH <11    Imaging results:  Ct Head Wo Contrast  04/21/2012  *RADIOLOGY REPORT*  Clinical Data: Left-sided weakness.  CT HEAD WITHOUT CONTRAST  Technique:  Contiguous axial images were obtained from the base of the skull through the vertex without contrast.  Comparison: 04/07/2012  Findings: There is no evidence for acute hemorrhage, hydrocephalus, mass lesion, or abnormal extra-axial fluid collection.  No definite CT evidence for acute infarction.  Patchy low attenuation in the deep hemispheric and periventricular white matter is nonspecific, but likely reflects chronic microvascular ischemic demyelination. Lacunar infarcts are seen in the left basal ganglia and left pons, as before. The visualized paranasal sinuses and mastoid air  cells are clear.  IMPRESSION: Stable.  No acute intracranial abnormality.  I personally called the results of the study to Dr. Rosalia Hammers in the emergency department at 1036 hours on 04/21/2012.   Original Report Authenticated By: Kennith Center, M.D.     Assessment & Plan by Problem: Active Problems:  * No active hospital problems. *    62 year old male with history of alcohol abuse, hypertension, previous left thalamic stroke, presents with seizure and left-sided facial droop and weakness.  Seizure Patient with multiple witnessed seizures and appears post ictal on exam. Likely alcohol withdrawal seizures given no other known hx of seizures and known recent etoh use. Also consider metabolic  etiologies including methanol/antifreeze or other poisoning as patient has been drinking mouthwash and is an unreliable historian.  Patient's anion gap on admission is 15.  Called poison control for recommendations on mouthwash poisoning, recommended treating as ETOH withdrawal EEG done in the ED was normal, without any residual seizure activity. CT of the head without any evidence of hemorrhage or infarction, no other intracranial abnormalities or seizure focus, MRI without any acute or subacute infarct. Neurology following.  -Admit to internal medicine teaching service, step down bed -Continue IV Keppra per neurology recommendations -Will obtain the following labs: CMP, TSH, CK, acetaminophen level, UA, urine drug screen, urine K, Na, Osm, Chloride, Salicylate level,  -EKG -Seizure precautions -N.p.o. until improvement in mental status  ?Focal neurologic deficits Patient with history of stroke with right-sided weakness and? New Left-sided weakness on arrival. Exam difficult as patient is uncooperative. Etiology is likely postictal as the patient had several witnessed seizures. MRI and CT without evidence of stroke.   -Continue to monitor with neuro checks -MRA pending  Alcohol abuse Patient states that he  has been drinking about a pint of mouthwash per day for the past month. Potentially harmful ingredients in mouthwash include chlorhexidine gluconate, ethanol, hydrogen peroxide, and methyl salicylate. Liver enzymes on admission are within normal limits.  -CIWA protocol -Thiamine, folic acid, multivitamin  Hypertension Patient is hypertensive on admission. 150/90, but there is a recorded 192/119 in the ED. his home medications include lisinopril and amlodipine, but he has been out of these medications for several months.  -IV hydralazine PRN as patient is n.p.o. for now -Can continue home meds when patient is more alert  Thrombocytopenia Patient has a history of thrombocytopenia. Platelets are 134 on admission. No signs of active bleeding, can do knee to monitor. Likely from chronic alcohol use.  DVT Lovenox   Dispo: Disposition is deferred at this time, awaiting improvement of current medical problems. Anticipated discharge in approximately 2-3 days.   The patient does not have a current PCP (Provider Not In System), therefore will not be requiring OPC follow-up after discharge.   The patient does have transportation limitations that hinder transportation to clinic appointments.  Signed: Denton Ar 04/21/2012, 2:17 PM

## 2012-04-21 NOTE — ED Notes (Signed)
Pt with less than 1 min seizure with urinary incontinence while MD at bedside, no resp distress or vomiting, 1 mg Ativan given

## 2012-04-21 NOTE — ED Notes (Signed)
To ED vis EMS, pt reoprtedly homeless, was seen seizing by bystanders and PD, also seized X2 with EMS, pt developed left sided weakness during transport with right sided gaze, EMS gave 5mg  Versed pta, code stroke called on arrival, pt taken to CT with RN and monitor, stroke team to bedside, EDP and neuro MD to bedside following CT, code stroke canceled following CT with no known LSN

## 2012-04-22 ENCOUNTER — Encounter (HOSPITAL_COMMUNITY): Payer: Self-pay

## 2012-04-22 DIAGNOSIS — F1010 Alcohol abuse, uncomplicated: Secondary | ICD-10-CM

## 2012-04-22 DIAGNOSIS — R4182 Altered mental status, unspecified: Secondary | ICD-10-CM

## 2012-04-22 DIAGNOSIS — R569 Unspecified convulsions: Principal | ICD-10-CM

## 2012-04-22 LAB — RAPID URINE DRUG SCREEN, HOSP PERFORMED
Cocaine: NOT DETECTED
Opiates: NOT DETECTED
Tetrahydrocannabinol: NOT DETECTED

## 2012-04-22 LAB — COMPREHENSIVE METABOLIC PANEL
AST: 28 U/L (ref 0–37)
CO2: 24 mEq/L (ref 19–32)
Calcium: 8.5 mg/dL (ref 8.4–10.5)
Creatinine, Ser: 0.59 mg/dL (ref 0.50–1.35)
GFR calc Af Amer: >90 mL/min (ref >90)
GFR calc non Af Amer: >90 mL/min (ref >90)
Glucose, Bld: 82 mg/dL (ref 70–99)

## 2012-04-22 LAB — CBC
Hemoglobin: 12.4 g/dL — ABNORMAL LOW (ref 13.0–17.0)
RBC: 3.36 MIL/uL — ABNORMAL LOW (ref 4.22–5.81)

## 2012-04-22 LAB — OSMOLALITY: Osmolality: 280 mOsm/kg (ref 275–300)

## 2012-04-22 LAB — URINALYSIS, ROUTINE W REFLEX MICROSCOPIC
Nitrite: NEGATIVE
Specific Gravity, Urine: 1.019 (ref 1.005–1.030)
Urobilinogen, UA: 0.2 mg/dL (ref 0.0–1.0)

## 2012-04-22 LAB — OSMOLALITY, URINE: Osmolality, Ur: 697 mOsm/kg (ref 390–1090)

## 2012-04-22 LAB — TSH: TSH: 2.242 u[IU]/mL (ref 0.350–4.500)

## 2012-04-22 LAB — NA AND K (SODIUM & POTASSIUM), RAND UR: Potassium Urine: 59 mEq/L

## 2012-04-22 MED ORDER — CLOPIDOGREL BISULFATE 75 MG PO TABS
75.0000 mg | ORAL_TABLET | Freq: Every day | ORAL | Status: DC
  Filled 2012-04-22: qty 1

## 2012-04-22 MED ORDER — AMLODIPINE BESYLATE 5 MG PO TABS
5.0000 mg | ORAL_TABLET | Freq: Every day | ORAL | Status: DC
  Administered 2012-04-22: 5 mg via ORAL
  Filled 2012-04-22 (×2): qty 1

## 2012-04-22 MED ORDER — CLOPIDOGREL BISULFATE 75 MG PO TABS
75.0000 mg | ORAL_TABLET | Freq: Every day | ORAL | Status: DC
  Administered 2012-04-22 – 2012-04-23 (×2): 75 mg via ORAL
  Filled 2012-04-22 (×5): qty 1

## 2012-04-22 MED ORDER — LISINOPRIL 10 MG PO TABS
10.0000 mg | ORAL_TABLET | Freq: Every day | ORAL | Status: DC
  Filled 2012-04-22: qty 1

## 2012-04-22 MED ORDER — POTASSIUM CHLORIDE CRYS ER 20 MEQ PO TBCR
40.0000 meq | EXTENDED_RELEASE_TABLET | Freq: Two times a day (BID) | ORAL | Status: DC
  Administered 2012-04-22: 40 meq via ORAL
  Filled 2012-04-22: qty 2

## 2012-04-22 MED ORDER — LISINOPRIL 5 MG PO TABS
5.0000 mg | ORAL_TABLET | Freq: Every day | ORAL | Status: DC
  Administered 2012-04-23: 5 mg via ORAL
  Filled 2012-04-22: qty 1

## 2012-04-22 NOTE — Progress Notes (Addendum)
**  Interval Progress Note**  Patient was found perseverating on the date of Feb 4 and was extremely suspicious about whether that was truly the date, despite showing patient the calendar and the date on his phone screen. Observed other odd behaviors. Concern for some underlying psych issues. Will keep patient overnight for further observation and discharge planning.  Will transfer patient out of step down unit as patient has been seizure free for over 24 hours. Transfer to floor. Cont CIWA protocol.   Denton Ar, MD 4:17 PM

## 2012-04-22 NOTE — Progress Notes (Signed)
NEURO HOSPITALIST PROGRESS NOTE   SUBJECTIVE:                                                                                                                        Patient now back to his baseline, he does not recall events of yesterday.  He states he continues to drink about 1 pint of Listerine and does drink ETOH but will not tell me what or how often.    OBJECTIVE:                                                                                                                           Vital signs in last 24 hours: Temp:  [97.9 F (36.6 C)-99.9 F (37.7 C)] 97.9 F (36.6 C) (02/04 0910) Pulse Rate:  [81-98] 81  (02/04 0910) Resp:  [16-20] 16  (02/04 0910) BP: (136-172)/(83-102) 136/92 mmHg (02/04 0910) SpO2:  [95 %-100 %] 96 % (02/04 0910) Weight:  [78.9 kg (173 lb 15.1 oz)] 78.9 kg (173 lb 15.1 oz) (02/03 2000)  Intake/Output from previous day: 02/03 0701 - 02/04 0700 In: 1605 [I.V.:1500; IV Piggyback:105] Out: 900 [Urine:900] Intake/Output this shift: Total I/O In: -  Out: 400 [Urine:400] Nutritional status: General  Past Medical History  Diagnosis Date  . Hypercholesteremia   . Stroke   . Hypertension   . Seizures      Neurologic Exam:  Mental Status: Alert, oriented, thought content appropriate.  Speech fluent without evidence of aphasia.  Able to follow 3 step commands without difficulty. Cranial Nerves: II: Discs flat bilaterally; Visual fields grossly normal, pupils equal, round, reactive to light and accommodation III,IV, VI: ptosis not present, extra-ocular motions intact bilaterally V,VII: smile symmetric, facial light touch sensation normal bilaterally VIII: hearing normal bilaterally IX,X: gag reflex present XI: bilateral shoulder shrug XII: midline tongue extension Motor: Right : Upper extremity   5/5    Left:     Upper extremity   5/5  Lower extremity   5/5     Lower extremity   5/5 Tone and bulk:normal tone  throughout; no atrophy noted Sensory: Pinprick and light touch intact throughout, bilaterally Deep Tendon Reflexes: 2+ and symmetric throughout Plantars: Right: downgoing   Left: downgoing Cerebellar: normal  finger-to-nose,  normal heel-to-shin test CV: pulses palpable throughout     Lab Results: Lab Results  Component Value Date/Time   CHOL  Value: 152        ATP III CLASSIFICATION:  <200     mg/dL   Desirable  086-578  mg/dL   Borderline High  >=469    mg/dL   High        62/95/2841  5:00 AM   Lipid Panel No results found for this basename: CHOL,TRIG,HDL,CHOLHDL,VLDL,LDLCALC in the last 72 hours  Studies/Results: X-ray Chest Pa And Lateral   04/21/2012  *RADIOLOGY REPORT*  Clinical Data: Altered mental status, loss of consciousness, history stroke, hypertension, seizures  CHEST - 2 VIEW  Comparison: 04/07/2012  Findings: Upper normal heart size. Mediastinal contours and pulmonary vascularity normal. New opacity in the medial right lung base question mild atelectasis versus infiltrate. Remaining lungs clear. No pleural effusion or pneumothorax. No acute osseous findings.  IMPRESSION: Atelectasis versus infiltrate at medial right lung base.   Original Report Authenticated By: Ulyses Southward, M.D.    Ct Head Wo Contrast  04/21/2012  *RADIOLOGY REPORT*  Clinical Data: Left-sided weakness.  CT HEAD WITHOUT CONTRAST  Technique:  Contiguous axial images were obtained from the base of the skull through the vertex without contrast.  Comparison: 04/07/2012  Findings: There is no evidence for acute hemorrhage, hydrocephalus, mass lesion, or abnormal extra-axial fluid collection.  No definite CT evidence for acute infarction.  Patchy low attenuation in the deep hemispheric and periventricular white matter is nonspecific, but likely reflects chronic microvascular ischemic demyelination. Lacunar infarcts are seen in the left basal ganglia and left pons, as before. The visualized paranasal sinuses and mastoid  air cells are clear.  IMPRESSION: Stable.  No acute intracranial abnormality.  I personally called the results of the study to Dr. Rosalia Hammers in the emergency department at 1036 hours on 04/21/2012.   Original Report Authenticated By: Kennith Center, M.D.    Mr Brain Wo Contrast  04/21/2012  *RADIOLOGY REPORT*  Clinical Data: Witnessed seizure.  MRI HEAD WITHOUT CONTRAST  Technique:  Multiplanar, multiecho pulse sequences of the brain and surrounding structures were obtained according to standard protocol without intravenous contrast.  Comparison: Head CT same day.  Multiple previous head CT exams. Most recent MRI 12/30/2009  Findings: Diffusion imaging does not show any acute or subacute infarction.  There are pronounced chronic small vessel changes affecting the pons.  There are a few old small vessel cerebellar infarctions.  The cerebral hemispheres show atrophy with old lacunar infarction at the left lateral thalamus/posterior limb internal capsule and a few old small vessel infarctions in the left basal ganglia.  There is mild chronic small vessel change within the hemispheric deep white matter.  No mass lesion, hemorrhage, hydrocephalus or extra-axial collection.  No pituitary mass.  No inflammatory sinus disease.  No skull or skull base lesion.  IMPRESSION: No acute finding.  Old small vessel ischemic changes primarily affecting the pons and left thalamus and basal ganglia.   Original Report Authenticated By: Paulina Fusi, M.D.     MEDICATIONS  Scheduled:   . enoxaparin (LOVENOX) injection  40 mg Subcutaneous Q24H  . folic acid  1 mg Oral Daily  . levetiracetam  500 mg Intravenous Q12H  . multivitamin with minerals  1 tablet Oral Daily  . potassium chloride  40 mEq Oral BID  . sodium chloride  3 mL Intravenous Q12H  . thiamine  100 mg Oral Daily   Or  . thiamine  100 mg Intravenous  Daily   MRI: 04/21/12 IMPRESSION:  No acute finding. Old small vessel ischemic changes primarily  affecting the pons and left thalamus and basal ganglia.  EEG: preliminary shows no epileptiform activity--formal reading to follow  ASSESSMENT/PLAN:                                                                                                               Patient Active Hospital Problem List: Seizure (04/21/2012)   Assessment: No further seizures over the last 24 hours. Patient remains on Keppra 500 mg BID without adverse effects.  MRI shows no acute infarct and EEG shows no epileptiform activity.    Plan:  1) Continue Keppra 500 mg BID for next 3-4 weeks and have follow up out patient with primary care MD.    No further recommendations. Neurology will S/O    Assessment and plan discussed with with attending physician and they are in agreement.    Felicie Morn PA-C [facility] [phone]  04/22/2012, 11:15 AM

## 2012-04-22 NOTE — Progress Notes (Signed)
Report called to Shanda Bumps, receiving RN on 3W.  Pt transferred to 3W17 via wheelchair with all belongings.   Roselie Awkward, RN

## 2012-04-22 NOTE — H&P (Signed)
Internal Medicine Attending Admission Note Date: 04/22/2012  Patient name: [name] [name] Medical record number: [id number] Date of birth: November 19, 1950 Age: 62 y.o. Gender: male  I saw and evaluated the patient. I reviewed the resident's note and I agree with the resident's findings and plan as documented in the resident's note.  Chief Complaint(s): Witnessed seizure x 1.  History - key components related to admission:  [name] [name] is a 62 year old homeless man with a history of alcohol abuse, hypertension, hyperlipidemia, and a left thalamic cerebrovascular accident in 2011 who is brought to the emergency department after having a witnessed seizure at a bus stop near the [facility]. He is a very difficult historian but denies a previous history of seizures or any recent trauma. He has not been taking any of his prescribed medications and currently is living in the basement of the [facility]. During our interview he became fascinated with a wall calendar. It read today's date of 04/22/2012. He became very concerned that the calendar was not accurate. I assured him that it was the correct date and showed him the same date on his phone. He still did not believe the date was February 4 and perseverated for the next several minutes about needing to figure out what the true date was. I was unable to get him off that topic and could not get any other medical history. When I asked him why he did not believe the calendar or myself or his phone he did not have a reason. When I asked him why the date was so important he just felt he needed to find the right date. He is currently without any other complaints.  Physical Exam - key components related to admission:  Filed Vitals:   04/22/12 0910 04/22/12 1200 04/22/12 1600 04/22/12 1605  BP: 136/92 145/89 124/75 117/76  Pulse: 81 92 103 109  Temp: 97.9 F (36.6 C) 99 F (37.2 C) 98.3 F (36.8 C)   TempSrc: Oral Oral Oral   Resp: 16 22 22 22   Height:      Weight:       SpO2: 96% 96% 98% 97%   General: Well-developed, well-nourished, man sitting comfortably in bed in no acute distress perseverating over finding the correct date. He was oriented to self and to place but clearly not oriented to day of the week, month, day of the month, or year. Lungs: Clear to auscultation bilaterally without wheezes. Heart regular rate and rhythm without murmurs. Abdomen: Soft, nontender, active bowel sounds. Extremities without edema. Neuro: Strength 5/5 throughout, sensation grossly intact.  Lab results:  Basic Metabolic Panel:  Basename 04/22/12 0415 04/21/12 1647 04/21/12 1155  NA 135 133* --  K 3.3* 3.7 --  CL 98 97 --  CO2 24 25 --  GLUCOSE 82 79 --  BUN 9 8 --  CREATININE 0.59 0.49* --  CALCIUM 8.5 8.7 --  MG -- -- --  PHOS -- -- 2.3   Liver Function Tests:  [facility] 04/22/12 0415 04/21/12 1034  AST 28 37  ALT 20 26  ALKPHOS 67 74  BILITOT 0.8 0.9  PROT 6.5 6.9  ALBUMIN 3.4* 3.7    Basename 04/21/12 1155  AMMONIA 40   CBC:  Basename 04/22/12 0415 04/21/12 1043 04/21/12 1034  WBC 4.6 -- 3.9*  NEUTROABS -- -- 2.7  HGB 12.4* 13.6 --  HCT 36.2* 40.0 --  MCV 107.7* -- 110.3*  PLT 139* -- 134*   Cardiac Enzymes:  Basename 04/21/12 1647 04/21/12 1034  CKTOTAL  103 --  CKMB -- --  CKMBINDEX -- --  TROPONINI -- <0.30   Thyroid Function Tests:  Basename 04/22/12 0415  TSH 2.242  T4TOTAL --  FREET4 --  T3FREE --  THYROIDAB --   Coagulation:  Basename 04/21/12 1034  INR 1.01   Urine Drug Screen:  Positive for benzodiazepines given for his seizure  Alcohol Level:  Basename 04/21/12 1034  ETH <11   Urinalysis:  Ketones 15  Misc. Labs:  Acetaminophen less than 15 CK 103 Salicylate less than 2 Osmolality 280 Osmole gap 15  Imaging results:  X-ray Chest Pa And Lateral   04/21/2012  *RADIOLOGY REPORT*  Clinical Data: Altered mental status, loss of consciousness, history stroke, hypertension, seizures  CHEST - 2 VIEW   Comparison: 04/07/2012  Findings: Upper normal heart size. Mediastinal contours and pulmonary vascularity normal. New opacity in the medial right lung base question mild atelectasis versus infiltrate. Remaining lungs clear. No pleural effusion or pneumothorax. No acute osseous findings.  IMPRESSION: Atelectasis versus infiltrate at medial right lung base.   Original Report Authenticated By: Ulyses Southward, M.D.    Ct Head Wo Contrast  04/21/2012  *RADIOLOGY REPORT*  Clinical Data: Left-sided weakness.  CT HEAD WITHOUT CONTRAST  Technique:  Contiguous axial images were obtained from the base of the skull through the vertex without contrast.  Comparison: 04/07/2012  Findings: There is no evidence for acute hemorrhage, hydrocephalus, mass lesion, or abnormal extra-axial fluid collection.  No definite CT evidence for acute infarction.  Patchy low attenuation in the deep hemispheric and periventricular white matter is nonspecific, but likely reflects chronic microvascular ischemic demyelination. Lacunar infarcts are seen in the left basal ganglia and left pons, as before. The visualized paranasal sinuses and mastoid air cells are clear.  IMPRESSION: Stable.  No acute intracranial abnormality.  I personally called the results of the study to Dr. Rosalia Hammers in the emergency department at 1036 hours on 04/21/2012.   Original Report Authenticated By: Kennith Center, M.D.    Mr Brain Wo Contrast  04/21/2012  *RADIOLOGY REPORT*  Clinical Data: Witnessed seizure.  MRI HEAD WITHOUT CONTRAST  Technique:  Multiplanar, multiecho pulse sequences of the brain and surrounding structures were obtained according to standard protocol without intravenous contrast.  Comparison: Head CT same day.  Multiple previous head CT exams. Most recent MRI 12/30/2009  Findings: Diffusion imaging does not show any acute or subacute infarction.  There are pronounced chronic small vessel changes affecting the pons.  There are a few old small vessel cerebellar  infarctions.  The cerebral hemispheres show atrophy with old lacunar infarction at the left lateral thalamus/posterior limb internal capsule and a few old small vessel infarctions in the left basal ganglia.  There is mild chronic small vessel change within the hemispheric deep white matter.  No mass lesion, hemorrhage, hydrocephalus or extra-axial collection.  No pituitary mass.  No inflammatory sinus disease.  No skull or skull base lesion.  IMPRESSION: No acute finding.  Old small vessel ischemic changes primarily affecting the pons and left thalamus and basal ganglia.   Original Report Authenticated By: Paulina Fusi, M.D.    Other results:  EEG: Normal  Assessment & Plan by Problem:  [name] [name] is a 62 year old man with a history of alcohol abuse, hypertension, hyperlipidemia, and a previous left thalamic cerebrovascular accident who presents with a witnessed seizure at a bus stop. An MRI of the head failed to reveal an acute infarct, and an EEG was normal at that time. Neurologic  exam reveals no focal motor or sensory deficits although there is a global cognitive deficit. There is also the concern there may be some underlying psychiatric or chronic alcoholic changes given his perseveration and odd behavior today. Despite our workup we do not have a good understanding of his underlying psychiatric or neurologic process. More investigation is necessary as he is likely to be unreliable in returning to outpatient visits given his homeless status and noncompliance with medications.  1) Seizure: Neurology has assessed the patient and recommends that he be kept on the Keppra for a period of one month. We will follow this recommendation. No further evaluation is felt to be necessary with regards to this seizure at this time.  2) Possible psychiatric disorder: We will spend more time trying to get a history from [name] [name] in hopes of leaving as to a possible psychiatric or neurologic this diagnosis. His TSH  was normal but his B12 and RPR are pending. If we're unable to come up with a psychiatric diagnosis we may ask our psychiatry colleagues to assess him in an effort to figure out the reason for his perseveration and on behavior.  3) Disposition: I agree with the housestaff's plan to transfer him to a general medical ward to allow further evaluation of his mental status so that we may be better able to treat his underlying disorder and refer him to appropriate social services.

## 2012-04-22 NOTE — Progress Notes (Signed)
Nutrition Brief Note  Patient identified on the Malnutrition Screening Tool (MST) Report for unsure of recent weight loss.  Wt Readings from Last 10 Encounters:  04/21/12 173 lb 15.1 oz (78.9 kg)  03/10/12 159 lb 2.8 oz (72.201 kg)   From review of usual weights in EMR, patient has not lost weight.  Patient reports that he eats 3 meals per day.  The man that he lives with shares food that he cooks with patient.  He also purchases inexpensive frozen meals.  From review of chart, patient is an alcoholic.    Nutrition Focused Physical Exam:  Subcutaneous Fat:  Orbital Region: WNL Upper Arm Region: WNL Thoracic and Lumbar Region: WNL  Muscle:  Temple Region: WNL Clavicle Bone Region: WNL Clavicle and Acromion Bone Region: WNL Scapular Bone Region: WNL Dorsal Hand: WNL Patellar Region: WNL Anterior Thigh Region: WNL Posterior Calf Region: WNL  Edema: none   Body mass index is 26.45 kg/(m^2). Patient meets criteria for overweight based on current BMI.   Current diet order is Regular, patient is consuming approximately 100% of meals at this time. Labs and medications reviewed.   No nutrition interventions warranted at this time. If nutrition issues arise, please consult RD.   Joaquin Courts, RD, LDN, CNSC Pager# [phone] After Hours Pager# [phone]

## 2012-04-22 NOTE — Progress Notes (Signed)
Subjective: [name] [name] is sitting up in bed talking this morning, back to baseline. He has had no further seizures since admission. He is pleasant and answers questions. He doesn't remember anything from yesterday. He denies having a history of seizures. He does admit to drinking listerine about 1-2 containers per day since 2010. Previously, he drank beer until he got in trouble with the police. He admits to having a rough few years being homeless after a fall out with his family over the care of his elderly mother. He thinks he ran out of mouthwash on Feb 2. He denies drinking other substances like antifreeze or taking other pills. He did go over to a friends house and ate some "bad food" and had diarrhea the next day. Unclear when this was but it sounds like last week some time.  He says he sleeps at the [facility] and uses food stamps that are mailed to his friend's house for food. He has stayed at homeless shelters in [place] and [place] and says they are dangerous. He used to be a patient at [facility] until it closed and he has been out of his medications since then. He was previously taking Plavix, Norvasc, and Lisinopril. He used to work at [employer] and another place [employer] that he should have retirement money coming in a few years.  Patient denies any weakness, numbness, changes in vision, headache, diarrhea, nausea, vomiting, fever, chills. No cough or chest pain. Slept well last night. He was hungry this morning and felt better after eating.  Objective: Vital signs in last 24 hours: Filed Vitals:   04/21/12 2000 04/21/12 2300 04/22/12 0323 04/22/12 0910  BP: 147/100 150/90 141/83 136/92  Pulse: 97 91 85 81  Temp: 98.9 F (37.2 C) 99.6 F (37.6 C) 98.4 F (36.9 C) 97.9 F (36.6 C)  TempSrc: Oral Oral Oral Oral  Resp: 19 18 19 16   Height: 5\' 8"  (1.727 m)     Weight: 173 lb 15.1 oz (78.9 kg)     SpO2: 95% 97% 96% 96%   Weight change:   Intake/Output Summary (Last 24 hours) at  04/22/12 1150 Last data filed at 04/22/12 0959  Gross per 24 hour  Intake   1605 ml  Output   1300 ml  Net    305 ml    Physical Exam Blood pressure 136/92, pulse 81, temperature 97.9 F (36.6 C), temperature source Oral, resp. rate 16, height 5\' 8"  (1.727 m), weight 173 lb 15.1 oz (78.9 kg), SpO2 96.00%. General:  No acute distress, alert and oriented x 3 HEENT:  PERRL, EOMI, moist mucous membranes Cardiovascular:  Regular rate and rhythm, no murmurs Respiratory:  Clear to auscultation bilaterally, no wheezes, rales, or rhonchi Abdomen:  Soft, nondistended, nontender, bowel sounds present Extremities:  Warm and well-perfused, no edema.  Skin: Warm, dry, no rashes Neuro: Cranial nerves intact. Strength 5/5 upper and lower extremities. Sensation intact. No facial asymmetry  Lab Results: Basic Metabolic Panel:  Lab 04/22/12 1610 04/21/12 1647 04/21/12 1155  NA 135 133* --  K 3.3* 3.7 --  CL 98 97 --  CO2 24 25 --  GLUCOSE 82 79 --  BUN 9 8 --  CREATININE 0.59 0.49* --  CALCIUM 8.5 8.7 --  MG -- -- --  PHOS -- -- 2.3   Liver Function Tests:  Lab 04/22/12 0415 04/21/12 1034  AST 28 37  ALT 20 26  ALKPHOS 67 74  BILITOT 0.8 0.9  PROT 6.5 6.9  ALBUMIN 3.4* 3.7    Lab 04/21/12 1155  AMMONIA 40   CBC:  Lab 04/22/12 0415 04/21/12 1043 04/21/12 1034  WBC 4.6 -- 3.9*  NEUTROABS -- -- 2.7  HGB 12.4* 13.6 --  HCT 36.2* 40.0 --  MCV 107.7* -- 110.3*  PLT 139* -- 134*   Cardiac Enzymes:  Lab 04/21/12 1647 04/21/12 1034  CKTOTAL 103 --  CKMB -- --  CKMBINDEX -- --  TROPONINI -- <0.30   Thyroid Function Tests:  Lab 04/22/12 0415  TSH 2.242  T4TOTAL --  FREET4 --  T3FREE --  THYROIDAB --   Coagulation:  Lab 04/21/12 1034  LABPROT 13.2  INR 1.01   Urine Drug Screen: Drugs of Abuse     Component Value Date/Time   LABOPIA NONE DETECTED 04/22/2012 0346   LABOPIA NEGATIVE 12/30/2009 1339   COCAINSCRNUR NONE DETECTED 04/22/2012 0346   COCAINSCRNUR NEGATIVE  12/30/2009 1339   LABBENZ POSITIVE* 04/22/2012 0346   LABBENZ NEGATIVE 12/30/2009 1339   AMPHETMU NONE DETECTED 04/22/2012 0346   AMPHETMU NEGATIVE 12/30/2009 1339   THCU NONE DETECTED 04/22/2012 0346   LABBARB NONE DETECTED 04/22/2012 0346    Alcohol Level:  Lab 04/21/12 1034  ETH <11   Urinalysis:  Lab 04/22/12 0346  COLORURINE YELLOW  LABSPEC 1.019  PHURINE 7.5  GLUCOSEU NEGATIVE  HGBUR NEGATIVE  BILIRUBINUR NEGATIVE  KETONESUR 15*  PROTEINUR NEGATIVE  UROBILINOGEN 0.2  NITRITE NEGATIVE  LEUKOCYTESUR NEGATIVE     Micro Results: Recent Results (from the past 240 hour(s))  MRSA PCR SCREENING     Status: Normal   Collection Time   04/21/12  7:31 PM      Component Value Range Status Comment   MRSA by PCR NEGATIVE  NEGATIVE Final    Studies/Results: X-ray Chest Pa And Lateral   04/21/2012  *RADIOLOGY REPORT*  Clinical Data: Altered mental status, loss of consciousness, history stroke, hypertension, seizures  CHEST - 2 VIEW  Comparison: 04/07/2012  Findings: Upper normal heart size. Mediastinal contours and pulmonary vascularity normal. New opacity in the medial right lung base question mild atelectasis versus infiltrate. Remaining lungs clear. No pleural effusion or pneumothorax. No acute osseous findings.  IMPRESSION: Atelectasis versus infiltrate at medial right lung base.   Original Report Authenticated By: Ulyses Southward, M.D.    Ct Head Wo Contrast  04/21/2012  *RADIOLOGY REPORT*  Clinical Data: Left-sided weakness.  CT HEAD WITHOUT CONTRAST  Technique:  Contiguous axial images were obtained from the base of the skull through the vertex without contrast.  Comparison: 04/07/2012  Findings: There is no evidence for acute hemorrhage, hydrocephalus, mass lesion, or abnormal extra-axial fluid collection.  No definite CT evidence for acute infarction.  Patchy low attenuation in the deep hemispheric and periventricular white matter is nonspecific, but likely reflects chronic microvascular  ischemic demyelination. Lacunar infarcts are seen in the left basal ganglia and left pons, as before. The visualized paranasal sinuses and mastoid air cells are clear.  IMPRESSION: Stable.  No acute intracranial abnormality.  I personally called the results of the study to Dr. Rosalia Hammers in the emergency department at 1036 hours on 04/21/2012.   Original Report Authenticated By: Kennith Center, M.D.    Mr Brain Wo Contrast  04/21/2012  *RADIOLOGY REPORT*  Clinical Data: Witnessed seizure.  MRI HEAD WITHOUT CONTRAST  Technique:  Multiplanar, multiecho pulse sequences of the brain and surrounding structures were obtained according to standard protocol without intravenous contrast.  Comparison: Head CT same day.  Multiple previous head  CT exams. Most recent MRI 12/30/2009  Findings: Diffusion imaging does not show any acute or subacute infarction.  There are pronounced chronic small vessel changes affecting the pons.  There are a few old small vessel cerebellar infarctions.  The cerebral hemispheres show atrophy with old lacunar infarction at the left lateral thalamus/posterior limb internal capsule and a few old small vessel infarctions in the left basal ganglia.  There is mild chronic small vessel change within the hemispheric deep white matter.  No mass lesion, hemorrhage, hydrocephalus or extra-axial collection.  No pituitary mass.  No inflammatory sinus disease.  No skull or skull base lesion.  IMPRESSION: No acute finding.  Old small vessel ischemic changes primarily affecting the pons and left thalamus and basal ganglia.   Original Report Authenticated By: Paulina Fusi, M.D.    Medications: Reviewed Scheduled Meds:   . enoxaparin (LOVENOX) injection  40 mg Subcutaneous Q24H  . folic acid  1 mg Oral Daily  . levetiracetam  500 mg Intravenous Q12H  . multivitamin with minerals  1 tablet Oral Daily  . potassium chloride  40 mEq Oral BID  . sodium chloride  3 mL Intravenous Q12H  . thiamine  100 mg Oral Daily    Or  . thiamine  100 mg Intravenous Daily   Continuous Infusions:   . sodium chloride 125 mL/hr at 04/21/12 2000   PRN Meds:.hydrALAZINE, LORazepam, LORazepam, ondansetron (ZOFRAN) IV, ondansetron  Assessment/Plan:  62 year old male with history of alcohol abuse, hypertension, previous left thalamic stroke, presents with seizure and left-sided facial droop and weakness.   Seizures: resolved Patient with multiple witnessed seizures and appears post ictal on admission. Likely alcohol withdrawal seizures given no other known hx of seizures and known chronic etoh use. Also consider metabolic etiologies including methanol/antifreeze or other poisoning as patient has been drinking mouthwash and is an unreliable historian. Patient's anion gap on admission is 15. Called poison control for recommendations on mouthwash poisoning, recommended treating as ETOH. EEG done in the ED was normal, without any residual seizure activity. CT of the head without any evidence of hemorrhage or infarction, no other intracranial abnormalities or seizure focus, MRI without any acute or subacute infarct.  Labs overnight unremarkable 2/4: patient back to baseline, no residual neuro deficits.   -will give pt Rx for Keppra  Alcohol abuse - chronic since ~2000 Patient states that he has been drinking about a pint of mouthwash per day for the past month. Potentially harmful ingredients in mouthwash include chlorhexidine gluconate, ethanol, hydrogen peroxide, and methyl salicylate.  Liver enzymes on admission are within normal limits.   -CIWA protocol  -Thiamine, folic acid, multivitamin  -counseled against using mouthwash and alcohol, offered resources and counseling  Hypertension  Patient is hypertensive on admission. 150/90, but there is a recorded 192/119 in the ED. his home medications include lisinopril and amlodipine, but he has been out of these medications for several months.  BP elevated overnight   -restart  home lisinopril, norvasc, Rx given  Thrombocytopenia  Patient has a history of thrombocytopenia. Platelets are 134 on admission. No signs of active bleeding, can do knee to monitor. Likely from chronic alcohol use.   Hx of Stroke -cont home Plavix  DVT  Lovenox   Diet Regular  Dispo:  Patient ready for dc as he is back to baseline mental status. Patient given resource guide.   The patient does not have a current PCP (Provider Not In System), therefore will not be requiring [facility] follow-up after  discharge.  The patient does have transportation limitations that hinder transportation to clinic appointments.     LOS: 1 day   [place] Ar 04/22/2012, 11:50 AM

## 2012-04-22 NOTE — Discharge Summary (Signed)
Internal Medicine Teaching [facility] Discharge Note  Name: [name] [name] MRN: [id number] DOB: 03-21-1950 62 y.o.  Date of Admission: 04/21/2012 10:20 AM Date of Discharge: 04/23/2012 Attending Physician: Rocco Serene, MD  Discharge Diagnosis: Principal Problem:  *Seizure Active Problems:  ETOH abuse  Altered mental status   Discharge Medications:   Medication List     As of 04/23/2012  2:57 PM    TAKE these medications         amLODipine 10 MG tablet   Commonly known as: NORVASC   Take 1 tablet (10 mg total) by mouth daily.      NORVASC PO   Take 1 tablet by mouth daily.      clopidogrel 75 MG tablet   Commonly known as: PLAVIX   Take 1 tablet (75 mg total) by mouth daily with breakfast.      clopidogrel 75 MG tablet   Commonly known as: PLAVIX   Take 75 mg by mouth daily.      levETIRAcetam 500 MG tablet   Commonly known as: KEPPRA   Take 1 tablet (500 mg total) by mouth 2 (two) times daily.      lisinopril 5 MG tablet   Commonly known as: PRINIVIL,ZESTRIL   Take 1 tablet (5 mg total) by mouth daily.      LISINOPRIL PO   Take 1 tablet by mouth daily.      multivitamin with minerals Tabs   Take 1 tablet by mouth daily.          Disposition and follow-up:   Mr.[name] [name] was discharged from [facility] in stable condition.  At the hospital follow up visit please address the following:  -Followup on hypertension, adjust medications as needed -Discuss psychiatric concerns and evaluate if the patient would benefit from an antipsychotic -Counseling on smoking and alcohol/mouthwash cessation  Follow-up Appointments:  Discharge Orders    Future Orders Please Complete By Expires   Diet - low sodium heart healthy      Increase activity slowly         Consultations:    Procedures Performed:  X-ray Chest Pa And Lateral   04/21/2012  *RADIOLOGY REPORT*  Clinical Data: Altered mental status, loss of consciousness,  history stroke, hypertension, seizures  CHEST - 2 VIEW  Comparison: 04/07/2012  Findings: Upper normal heart size. Mediastinal contours and pulmonary vascularity normal. New opacity in the medial right lung base question mild atelectasis versus infiltrate. Remaining lungs clear. No pleural effusion or pneumothorax. No acute osseous findings.  IMPRESSION: Atelectasis versus infiltrate at medial right lung base.   Original Report Authenticated By: Ulyses Southward, M.D.    Dg Chest 2 View  04/07/2012  *RADIOLOGY REPORT*  Clinical Data: Fall and cough.  CHEST - 2 VIEW  Comparison: 04/13/2010  Findings: The lungs are clear.  No edema, infiltrate, pneumothorax or pleural fluid identified.  The heart size and mediastinal contours are within normal limits.  Bony thorax shows mild degenerative changes of the thoracic spine.  IMPRESSION: No active disease.   Original Report Authenticated By: Irish Lack, M.D.    Dg Hip Complete Left  04/07/2012  *RADIOLOGY REPORT*  Clinical Data: Fall with left hip pain.  LEFT HIP - COMPLETE 2+ VIEW  Comparison: 05/11/2010  Findings: No fracture, dislocation, bony lesion or soft tissue abnormalities identified.  No significant arthropathy present.  The bony pelvis is intact and normal in appearance.  IMPRESSION: Normal left hip.   Original Report Authenticated By:  Irish Lack, M.D.    Ct Head Wo Contrast  04/21/2012  *RADIOLOGY REPORT*  Clinical Data: Left-sided weakness.  CT HEAD WITHOUT CONTRAST  Technique:  Contiguous axial images were obtained from the base of the skull through the vertex without contrast.  Comparison: 04/07/2012  Findings: There is no evidence for acute hemorrhage, hydrocephalus, mass lesion, or abnormal extra-axial fluid collection.  No definite CT evidence for acute infarction.  Patchy low attenuation in the deep hemispheric and periventricular white matter is nonspecific, but likely reflects chronic microvascular ischemic demyelination. Lacunar infarcts are  seen in the left basal ganglia and left pons, as before. The visualized paranasal sinuses and mastoid air cells are clear.  IMPRESSION: Stable.  No acute intracranial abnormality.  I personally called the results of the study to Dr. Rosalia Hammers in the emergency department at 1036 hours on 04/21/2012.   Original Report Authenticated By: Kennith Center, M.D.    Ct Head Wo Contrast  04/07/2012  *RADIOLOGY REPORT*  Clinical Data:  Fall with left eyebrow laceration and hematoma with bruising.  CT HEAD WITHOUT CONTRAST CT CERVICAL SPINE WITHOUT CONTRAST  Technique:  Multidetector CT imaging of the head and cervical spine was performed following the standard protocol without intravenous contrast.  Multiplanar CT image reconstructions of the cervical spine were also generated.  Comparison:  05/11/2010.  CT HEAD  Findings: No evidence of acute infarct, acute hemorrhage, mass lesion, mass effect or hydrocephalus.  Atrophy.  Mild periventricular low attenuation.  Remote infarct in the posterior limb of the left internal capsule.  No air fluid levels in the visualized portions of the paranasal sinuses and mastoid air cells.  IMPRESSION:  1.  No acute renal abnormality. 2.  Atrophy and chronic microvascular white matter ischemic changes.  CT CERVICAL SPINE  Findings: Alignment is anatomic.  No fracture.  There are multilevel endplate degenerative changes with uncovertebral hypertrophy and facet sclerosis as well as loss of disc space height.  Findings are worst at C4-5 through C6-7.  At C3-4, moderate to severe left neural foraminal narrowing.  At C4-5, severe left, and moderate right, neural foraminal narrowing.  At C5-6, severe left, and moderate right neural foraminal narrowing.  At C6-7, severe bilateral neural foraminal narrowing.  At C7-T1, severe right neural foraminal narrowing.  Visualized lung apices show emphysema and scarring.  Soft tissues are unremarkable.  IMPRESSION:  1.  No evidence of acute fracture or subluxation. 2.   Multilevel spondylosis.   Original Report Authenticated By: Leanna Battles, M.D.    Ct Cervical Spine Wo Contrast  04/07/2012  *RADIOLOGY REPORT*  Clinical Data:  Fall with left eyebrow laceration and hematoma with bruising.  CT HEAD WITHOUT CONTRAST CT CERVICAL SPINE WITHOUT CONTRAST  Technique:  Multidetector CT imaging of the head and cervical spine was performed following the standard protocol without intravenous contrast.  Multiplanar CT image reconstructions of the cervical spine were also generated.  Comparison:  05/11/2010.  CT HEAD  Findings: No evidence of acute infarct, acute hemorrhage, mass lesion, mass effect or hydrocephalus.  Atrophy.  Mild periventricular low attenuation.  Remote infarct in the posterior limb of the left internal capsule.  No air fluid levels in the visualized portions of the paranasal sinuses and mastoid air cells.  IMPRESSION:  1.  No acute renal abnormality. 2.  Atrophy and chronic microvascular white matter ischemic changes.  CT CERVICAL SPINE  Findings: Alignment is anatomic.  No fracture.  There are multilevel endplate degenerative changes with uncovertebral hypertrophy and facet sclerosis  as well as loss of disc space height.  Findings are worst at C4-5 through C6-7.  At C3-4, moderate to severe left neural foraminal narrowing.  At C4-5, severe left, and moderate right, neural foraminal narrowing.  At C5-6, severe left, and moderate right neural foraminal narrowing.  At C6-7, severe bilateral neural foraminal narrowing.  At C7-T1, severe right neural foraminal narrowing.  Visualized lung apices show emphysema and scarring.  Soft tissues are unremarkable.  IMPRESSION:  1.  No evidence of acute fracture or subluxation. 2.  Multilevel spondylosis.   Original Report Authenticated By: Leanna Battles, M.D.    Mr Brain Wo Contrast  04/21/2012  *RADIOLOGY REPORT*  Clinical Data: Witnessed seizure.  MRI HEAD WITHOUT CONTRAST  Technique:  Multiplanar, multiecho pulse sequences of  the brain and surrounding structures were obtained according to standard protocol without intravenous contrast.  Comparison: Head CT same day.  Multiple previous head CT exams. Most recent MRI 12/30/2009  Findings: Diffusion imaging does not show any acute or subacute infarction.  There are pronounced chronic small vessel changes affecting the pons.  There are a few old small vessel cerebellar infarctions.  The cerebral hemispheres show atrophy with old lacunar infarction at the left lateral thalamus/posterior limb internal capsule and a few old small vessel infarctions in the left basal ganglia.  There is mild chronic small vessel change within the hemispheric deep white matter.  No mass lesion, hemorrhage, hydrocephalus or extra-axial collection.  No pituitary mass.  No inflammatory sinus disease.  No skull or skull base lesion.  IMPRESSION: No acute finding.  Old small vessel ischemic changes primarily affecting the pons and left thalamus and basal ganglia.   Original Report Authenticated By: Paulina Fusi, M.D.    Dg Finger Thumb Right  04/07/2012  *RADIOLOGY REPORT*  Clinical Data: Pain and laceration right thumb  RIGHT THUMB 2+V  Comparison: Right hand radiographs 04/13/2010  Findings: Osseous mineralization normal. Joint spaces preserved. No acute fracture, dislocation or bone destruction.  IMPRESSION: No acute osseous abnormalities.   Original Report Authenticated By: Ulyses Southward, M.D.      Admission HPI: Mr. Milstein is a 62 year old male with a history of alcohol abuse, homelessness, hypertension, left thalamic CVA in 2011. He presents after having a witnessed seizure at the bus stop this morning.  Patient is only intermittently answering questions, so much of the history is obtained from the medical record. According to the chart, EMS witnessed 2 seizures with right-sided increased tonicity and right eye deviation. He was given 5 mg of Versed and in the ambulance. After arrival, he was noted to have  some left-sided weakness. Code stroke was called and neurology evaluated the patient in ED. He was given a Keppra load, EEG.  Patient states that he drinks about a pint of mouthwash per day, states he has been doing this for about a month. His last drink was today according to the patient. He denies drinking any other type of alcohol including rubbing alcohol or anti-freeze.  He denies any hematemesis, weakness, numbness, headache, abdominal pain.   Hospital Course by problem list: Principal Problem:  *Seizure Active Problems:  ETOH abuse  Altered mental status   Seizures: resolved  Patient with multiple witnessed seizures and was post ictal and altered on admission. He did have some L sided weakness on admission which resolved by the next morning. Anion gap on admission was 15, no other metabolic abnormalities were noted. Chest x-ray showed no evidence of aspiration. CT of the head without  any evidence of hemorrhage or infarction, no other intracranial abnormalities or seizure focus, MRI without any acute or subacute infarct. EEG done in the ED was normal, no epileptiform activity. As patient is a long-term alcoholic and drinks about a pint of mouthwash per day, it was thought that the seizures were likely from alcohol withdrawal. Patient was admitted to step down unit where he was observed overnight without any further seizures. He was then transferred to the floor after being back to his baseline mental status the morning after admission. Patient did admit that he had run out of mouthwash the day prior to admission. Patient was switched to by mouth Keppra and per neurology, will continue this medication for 3-4 weeks after discharge. Patient was stable and had no further seizures during this admission.  ?Schizoid personality  Patient was noted to have some paranoid features and some odd behavior during this admission. He doesn't currently meet criteria for schizophrenia or any specific psychiatric  diagnosis and patient does not carry any diagnosis that we know of. 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 Given that patient is functional and not having active hallucinations, suicidal or homicidal ideations, no interventions need at this time. Pt can follow up with [facility].   Alcohol abuse - chronic since ~2000. Patient states that he has been drinking about a pint of mouthwash per day for the past 6+ years. Liver enzymes on admission are within normal limits. We called poison control, and they recommended treating it like alcohol. Patient was placed on CIWA protocol, given thiamine, folic acid, multivitamin. He did not have any signs of withdrawal during this admission, no tremor, no fever. He was counseled against the use of mouthwash as well as alcohol and other drugs. He was offered community resources as well as counseling.  Hypertension  Patient  hypertensive on admission. 150/90, but there was a recorded 192/119 in the ED. his home medications include lisinopril and amlodipine, but he has been out of these medications for several months. Patient was restarted on the central and amlodipine on this hospitalization, and he'll be given prescriptions on discharge.  Thrombocytopenia  Patient has a history of thrombocytopenia. Platelets were 134 on admission and remained stable throughout hospitalization. No signs of active bleeding. Likely from chronic alcohol use.  Homelessness Patient has been homeless for at least 10 years. He sleeps outside the [facility] and gets food via food stamps mailed to his friend. He seems to be quite functional and has the capacity to care for himself. He states that he has tried all the homeless shelters in town, and he does not want to go back to any of those. Social worker did meet with the patient during this admission and offered additional community services.    Discharge Vitals:  BP 141/83  Pulse 82  Temp 98.2 F (36.8 C) (Oral)  Resp 20  Ht 5\' 8"   (1.727 m)  Wt 154 lb 4.8 oz (69.99 kg)  BMI 23.46 kg/m2  SpO2 99%  Discharge Labs:  Results for orders placed during the hospital encounter of 04/21/12 (from the past 24 hour(s))  RPR     Status: Normal   Collection Time   04/23/12  5:52 AM      Component Value Range   RPR NON REACTIVE  NON REACTIVE  VITAMIN B12     Status: Normal   Collection Time   04/23/12  5:52 AM      Component Value Range   Vitamin B-12 703  211 - 911 pg/mL  COMPREHENSIVE METABOLIC PANEL     Status: Abnormal   Collection Time   04/23/12 11:00 AM      Component Value Range   Sodium 136  135 - 145 mEq/L   Potassium 4.1  3.5 - 5.1 mEq/L   Chloride 98  96 - 112 mEq/L   CO2 29  19 - 32 mEq/L   Glucose, Bld 109 (*) 70 - 99 mg/dL   BUN 7  6 - 23 mg/dL   Creatinine, Ser 2.13  0.50 - 1.35 mg/dL   Calcium 9.0  8.4 - 08.6 mg/dL   Total Protein 6.9  6.0 - 8.3 g/dL   Albumin 3.5  3.5 - 5.2 g/dL   AST 26  0 - 37 U/L   ALT 20  0 - 53 U/L   Alkaline Phosphatase 66  39 - 117 U/L   Total Bilirubin 0.5  0.3 - 1.2 mg/dL   GFR calc non Af Amer >90  >90 mL/min   GFR calc Af Amer >90  >90 mL/min  CBC     Status: Abnormal   Collection Time   04/23/12 11:00 AM      Component Value Range   WBC 4.9  4.0 - 10.5 K/uL   RBC 3.37 (*) 4.22 - 5.81 MIL/uL   Hemoglobin 12.5 (*) 13.0 - 17.0 g/dL   HCT 57.8 (*) 46.9 - 62.9 %   MCV 107.1 (*) 78.0 - 100.0 fL   MCH 37.1 (*) 26.0 - 34.0 pg   MCHC 34.6  30.0 - 36.0 g/dL   RDW 52.8  41.3 - 24.4 %   Platelets 145 (*) 150 - 400 K/uL    Signed: Denton Ar 04/23/2012, 2:57 PM   Time Spent on Discharge: 30 minutes Services Ordered on Discharge: None Equipment Ordered on Discharge: None

## 2012-04-22 NOTE — Progress Notes (Signed)
Utilization review completed.  

## 2012-04-23 LAB — VITAMIN B12: Vitamin B-12: 703 pg/mL (ref 211–911)

## 2012-04-23 LAB — CBC
Hemoglobin: 12.5 g/dL — ABNORMAL LOW (ref 13.0–17.0)
MCH: 37.1 pg — ABNORMAL HIGH (ref 26.0–34.0)
RBC: 3.37 MIL/uL — ABNORMAL LOW (ref 4.22–5.81)

## 2012-04-23 LAB — COMPREHENSIVE METABOLIC PANEL
AST: 26 U/L (ref 0–37)
Albumin: 3.5 g/dL (ref 3.5–5.2)
CO2: 29 mEq/L (ref 19–32)
Calcium: 9 mg/dL (ref 8.4–10.5)
Creatinine, Ser: 0.66 mg/dL (ref 0.50–1.35)
GFR calc non Af Amer: >90 mL/min (ref >90)

## 2012-04-23 LAB — RPR: RPR Ser Ql: NONREACTIVE

## 2012-04-23 LAB — CHLORIDE, URINE, RANDOM: Chloride Urine: 222 mEq/L

## 2012-04-23 LAB — FOLATE: Folate: 18.5 ng/mL

## 2012-04-23 MED ORDER — AMLODIPINE BESYLATE 10 MG PO TABS: 10.0000 mg | ORAL_TABLET | Freq: Every day | ORAL | Status: DC

## 2012-04-23 MED ORDER — LISINOPRIL 5 MG PO TABS: 5.0000 mg | ORAL_TABLET | Freq: Every day | ORAL | Status: DC

## 2012-04-23 MED ORDER — LEVETIRACETAM 500 MG PO TABS: 500.0000 mg | ORAL_TABLET | Freq: Two times a day (BID) | ORAL | Status: DC

## 2012-04-23 MED ORDER — CLOPIDOGREL BISULFATE 75 MG PO TABS: 75.0000 mg | ORAL_TABLET | Freq: Every day | ORAL | Status: DC

## 2012-04-23 MED ORDER — LORAZEPAM 1 MG PO TABS: 1.0000 mg | ORAL_TABLET | Freq: Once | ORAL | Status: DC | PRN

## 2012-04-23 MED ORDER — AMLODIPINE BESYLATE 10 MG PO TABS
10.0000 mg | ORAL_TABLET | Freq: Every day | ORAL | Status: DC
  Administered 2012-04-23: 10 mg via ORAL
  Filled 2012-04-23: qty 1

## 2012-04-23 MED ORDER — LEVETIRACETAM 500 MG PO TABS
500.0000 mg | ORAL_TABLET | Freq: Two times a day (BID) | ORAL | Status: DC
  Administered 2012-04-23: 500 mg via ORAL
  Filled 2012-04-23 (×2): qty 1

## 2012-04-23 NOTE — Care Management Note (Signed)
    Page 1 of 1   04/23/2012     4:13:40 PM   CARE MANAGEMENT NOTE 04/23/2012  Patient:  [name] [name],[name] [name]   Account Number:  [id number]  Date Initiated:  04/22/2012  Documentation initiated by:  Donn Pierini  Subjective/Objective Assessment:   Pt admitted with AMS/ Seizure activity     Action/Plan:   PTA pt was homeless- CSW consulted- NCM to follow for d/c needs and planning   Anticipated DC Date:  04/25/2012   Anticipated DC Plan:    In-house referral  Clinical Social Worker      DC Planning Services  CM consult  Medication Assistance  [facility] Program      Choice offered to / List presented to:             Status of service:  Completed, signed off Medicare Important Message given?   (If response is "NO", the following Medicare IM given date fields will be blank) Date Medicare IM given:   Date Additional Medicare IM given:    Discharge Disposition:  HOME W HOME HEALTH SERVICES  Per UR Regulation:  Reviewed for med. necessity/level of care/duration of stay  If discussed at Long Length of Stay Meetings, dates discussed:    Comments:  04-23-12 [address] [place], [place] [phone] Pt planned for d/c today and the [facility] program was used for pt. CM did call the physician on call and she felt like he would not be a good candidate for the clinic. MD stated that they gave him resources for f/u to other clinics in [place]. CM spoke to pt's friend and the friend will purchase medications tonight via MATCH.  No further nned for CM at this time.

## 2012-04-23 NOTE — Progress Notes (Signed)
Internal Medicine Attending  Date: 04/23/2012  Patient name: [name] [name] Medical record number: [id number] Date of birth: 12/29/1950 Age: 62 y.o. Gender: male  I saw and evaluated the patient. I reviewed the resident's note by Dr. Collier Bullock and I agree with the resident's findings and plans as documented in her progress note.  When we visited [name] [name] on rounds this morning he appeared to be on the phone. He was not talking and had a confused look on his face. We are not sure if there was someone else on the other end of the line at the beginning of our visit but clearly for the next minute and a half there was no one on the line yet [name] [name] continued to hold the phone to his ear. When he finally came to the realization that maybe there was no one on the line he hung up the phone, but was very distracted. He said that the fact that no one was on the other end of the line bothered him, and at this point he was having difficulty with his "emotions". He claims that that person on the other end of the line was his friend who had all of his clothes. As we subsequently tried to interview [name] [name] he was unable to carry on a spontaneous conversation as he continued to have concerns about the strange telephone episode. Other than being bothered by this telephone episode he had no other complaints and his only other concern was the fact that he remains homeless. We assured him we would get social services involved to provide him a list of resources in the community in which he may be able to tap into. He will be discharged today and we will try to provide him with a list of community providers the take uninsured and Medicaid potential patients. He will be discharged on 3-4 weeks of Keppra as per Neurology recommendations. 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  He does not meet any criteria for a specific psychiatric  diagnosis, therefore no therapy is indicated.

## 2012-04-23 NOTE — ED Notes (Signed)
Provided pt with clothing and supportive counseling.  CSW familiar with pt from previous ED visits.

## 2012-04-23 NOTE — Progress Notes (Signed)
Subjective:  Patient states he is feeling OK this morning. He seems to be upset about a recent phone call and states that his friend Nadine Counts "has all his clothes". He thinks Nadine Counts hung up on him but continues to listen and wait for him to come back on the line for several minutes despite the rounding team's arrival into the room. He states that he should not be discharged today because of his emotions.  He admits he has seen a psychiatrist before but "not voluntarily". He denies being on any psychiatric medications. Denies aud/visual hallucinations.  No cough, fever, CP, SOB. No seizures since admission.   Objective: Vital signs in last 24 hours: Filed Vitals:   04/22/12 1600 04/22/12 1605 04/22/12 2200 04/23/12 0624  BP: 124/75 117/76 151/91 149/92  Pulse: 103 109 85 88  Temp: 98.3 F (36.8 C)  97.6 F (36.4 C) 98.4 F (36.9 C)  TempSrc: Oral  Oral Oral  Resp: 22 22 20 20   Height:      Weight:    154 lb 4.8 oz (69.99 kg)  SpO2: 98% 97% 98% 99%   Weight change: -19 lb 10.3 oz (-8.91 kg)  Intake/Output Summary (Last 24 hours) at 04/23/12 0851 Last data filed at 04/23/12 0515  Gross per 24 hour  Intake   1385 ml  Output   3551 ml  Net  -2166 ml    Physical Exam Blood pressure 149/92, pulse 88, temperature 98.4 F (36.9 C), temperature source Oral, resp. rate 20, height 5\' 8"  (1.727 m), weight 154 lb 4.8 oz (69.99 kg), SpO2 99.00%. General:  No acute distress, alert and oriented x 3 HEENT:  PERRL, EOMI, moist mucous membranes Cardiovascular:  Regular rate and rhythm, no murmurs Respiratory:  Clear to auscultation bilaterally, no wheezes, rales, or rhonchi Abdomen:  Soft, nondistended, nontender, bowel sounds present Extremities:  Warm and well-perfused, no edema.  Skin: Warm, dry, no rashes Neuro: Cranial nerves intact. Strength 5/5 upper and lower extremities. Sensation intact. No facial asymmetry Psych: odd behavior with perseveration on the phone call. Seems unable to move  forward from that.  Lab Results: Basic Metabolic Panel:  Lab 04/22/12 1610 04/21/12 1647 04/21/12 1155  NA 135 133* --  K 3.3* 3.7 --  CL 98 97 --  CO2 24 25 --  GLUCOSE 82 79 --  BUN 9 8 --  CREATININE 0.59 0.49* --  CALCIUM 8.5 8.7 --  MG -- -- --  PHOS -- -- 2.3   Liver Function Tests:  Lab 04/22/12 0415 04/21/12 1034  AST 28 37  ALT 20 26  ALKPHOS 67 74  BILITOT 0.8 0.9  PROT 6.5 6.9  ALBUMIN 3.4* 3.7    Lab 04/21/12 1155  AMMONIA 40   CBC:  Lab 04/22/12 0415 04/21/12 1043 04/21/12 1034  WBC 4.6 -- 3.9*  NEUTROABS -- -- 2.7  HGB 12.4* 13.6 --  HCT 36.2* 40.0 --  MCV 107.7* -- 110.3*  PLT 139* -- 134*   Cardiac Enzymes:  Lab 04/21/12 1647 04/21/12 1034  CKTOTAL 103 --  CKMB -- --  CKMBINDEX -- --  TROPONINI -- <0.30   Thyroid Function Tests:  Lab 04/22/12 0415  TSH 2.242  T4TOTAL --  FREET4 --  T3FREE --  THYROIDAB --   Coagulation:  Lab 04/21/12 1034  LABPROT 13.2  INR 1.01   Urine Drug Screen: Drugs of Abuse     Component Value Date/Time   LABOPIA NONE DETECTED 04/22/2012 0346   LABOPIA NEGATIVE 12/30/2009  1339   COCAINSCRNUR NONE DETECTED 04/22/2012 0346   COCAINSCRNUR NEGATIVE 12/30/2009 1339   LABBENZ POSITIVE* 04/22/2012 0346   LABBENZ NEGATIVE 12/30/2009 1339   AMPHETMU NONE DETECTED 04/22/2012 0346   AMPHETMU NEGATIVE 12/30/2009 1339   THCU NONE DETECTED 04/22/2012 0346   LABBARB NONE DETECTED 04/22/2012 0346    Alcohol Level:  Lab 04/21/12 1034  ETH <11   Urinalysis:  Lab 04/22/12 0346  COLORURINE YELLOW  LABSPEC 1.019  PHURINE 7.5  GLUCOSEU NEGATIVE  HGBUR NEGATIVE  BILIRUBINUR NEGATIVE  KETONESUR 15*  PROTEINUR NEGATIVE  UROBILINOGEN 0.2  NITRITE NEGATIVE  LEUKOCYTESUR NEGATIVE     Micro Results: Recent Results (from the past 240 hour(s))  MRSA PCR SCREENING     Status: Normal   Collection Time   04/21/12  7:31 PM      Component Value Range Status Comment   MRSA by PCR NEGATIVE  NEGATIVE Final     Studies/Results: X-ray Chest Pa And Lateral   04/21/2012  *RADIOLOGY REPORT*  Clinical Data: Altered mental status, loss of consciousness, history stroke, hypertension, seizures  CHEST - 2 VIEW  Comparison: 04/07/2012  Findings: Upper normal heart size. Mediastinal contours and pulmonary vascularity normal. New opacity in the medial right lung base question mild atelectasis versus infiltrate. Remaining lungs clear. No pleural effusion or pneumothorax. No acute osseous findings.  IMPRESSION: Atelectasis versus infiltrate at medial right lung base.   Original Report Authenticated By: Ulyses Southward, M.D.    Ct Head Wo Contrast  04/21/2012  *RADIOLOGY REPORT*  Clinical Data: Left-sided weakness.  CT HEAD WITHOUT CONTRAST  Technique:  Contiguous axial images were obtained from the base of the skull through the vertex without contrast.  Comparison: 04/07/2012  Findings: There is no evidence for acute hemorrhage, hydrocephalus, mass lesion, or abnormal extra-axial fluid collection.  No definite CT evidence for acute infarction.  Patchy low attenuation in the deep hemispheric and periventricular white matter is nonspecific, but likely reflects chronic microvascular ischemic demyelination. Lacunar infarcts are seen in the left basal ganglia and left pons, as before. The visualized paranasal sinuses and mastoid air cells are clear.  IMPRESSION: Stable.  No acute intracranial abnormality.  I personally called the results of the study to Dr. Rosalia Hammers in the emergency department at 1036 hours on 04/21/2012.   Original Report Authenticated By: Kennith Center, M.D.    Mr Brain Wo Contrast  04/21/2012  *RADIOLOGY REPORT*  Clinical Data: Witnessed seizure.  MRI HEAD WITHOUT CONTRAST  Technique:  Multiplanar, multiecho pulse sequences of the brain and surrounding structures were obtained according to standard protocol without intravenous contrast.  Comparison: Head CT same day.  Multiple previous head CT exams. Most recent MRI  12/30/2009  Findings: Diffusion imaging does not show any acute or subacute infarction.  There are pronounced chronic small vessel changes affecting the pons.  There are a few old small vessel cerebellar infarctions.  The cerebral hemispheres show atrophy with old lacunar infarction at the left lateral thalamus/posterior limb internal capsule and a few old small vessel infarctions in the left basal ganglia.  There is mild chronic small vessel change within the hemispheric deep white matter.  No mass lesion, hemorrhage, hydrocephalus or extra-axial collection.  No pituitary mass.  No inflammatory sinus disease.  No skull or skull base lesion.  IMPRESSION: No acute finding.  Old small vessel ischemic changes primarily affecting the pons and left thalamus and basal ganglia.   Original Report Authenticated By: Paulina Fusi, M.D.    Medications: Reviewed  Scheduled Meds:    . amLODipine  5 mg Oral Daily  . clopidogrel  75 mg Oral Q breakfast  . enoxaparin (LOVENOX) injection  40 mg Subcutaneous Q24H  . folic acid  1 mg Oral Daily  . levetiracetam  500 mg Intravenous Q12H  . lisinopril  5 mg Oral Daily  . multivitamin with minerals  1 tablet Oral Daily  . sodium chloride  3 mL Intravenous Q12H  . thiamine  100 mg Oral Daily   Or  . thiamine  100 mg Intravenous Daily   Continuous Infusions:  PRN Meds:.LORazepam, LORazepam, LORazepam, ondansetron (ZOFRAN) IV, ondansetron  Assessment/Plan:  62 year old male with history of alcohol abuse, hypertension, previous left thalamic stroke, presents with seizure and left-sided facial droop and weakness.   Seizures: resolved Patient with multiple witnessed seizures and appears post ictal on admission. Likely alcohol withdrawal seizures given no other known hx of seizures and known chronic etoh use. Also consider metabolic etiologies including methanol/antifreeze or other poisoning as patient has been drinking mouthwash and is an unreliable historian. Patient's  anion gap on admission is 15. Called poison control for recommendations on mouthwash poisoning, recommended treating as ETOH. EEG done in the ED was normal, without any residual seizure activity. CT of the head without any evidence of hemorrhage or infarction, no other intracranial abnormalities or seizure focus, MRI without any acute or subacute infarct.  Labs overnight unremarkable, no metabolic abnormalities 2/4: patient back to baseline, no residual neuro deficits.  2/5: No seizures, cont PO keppra  -will give pt Rx for Keppra on discharge  Paranoia  Patient with paranoid features and some odd behavior. Doesn't meet criteria for schizophrenia and patient does not carry any diagnosis that we know of. Perhaps some schizoid personality. Given that patient is functional and not having active hallucinations, suicidal or homicidal ideations, no interventions need at this time. Pt can follow up with [facility].  Alcohol abuse - chronic since ~2000 Patient states that he has been drinking about a pint of mouthwash per day for the past month. Potentially harmful ingredients in mouthwash include chlorhexidine gluconate, ethanol, hydrogen peroxide, and methyl salicylate.  Liver enzymes on admission are within normal limits.  No signs of withdrawal during this admission  -cont CIWA protocol  -cont Thiamine, folic acid, multivitamin  -counseled against using mouthwash and alcohol, offered resources and counseling  Hypertension  Patient is hypertensive on admission. 150/90, but there is a recorded 192/119 in the ED. his home medications include lisinopril and amlodipine, but he has been out of these medications for several months.   -cont home lisinopril, norvasc, will give Rx at discharge  Thrombocytopenia  Patient has a history of thrombocytopenia. Platelets are 134 on admission. No signs of active bleeding, can do knee to monitor. Likely from chronic alcohol use.   Hx of Stroke -cont home  Plavix  DVT  Lovenox   Diet Regular  Dispo:  Patient ready for dc as he is back to baseline mental status. Patient given resource guide. Social work to see pt about community resources as well.   The patient does not have a current PCP (Provider Not In System), therefore will not be requiring OPC follow-up after discharge.  The patient does have transportation limitations that hinder transportation to clinic appointments.     LOS: 2 days   [place] Ar 04/23/2012, 8:51 AM

## 2012-04-23 NOTE — Progress Notes (Signed)
Met with patient who states [place] streets[place]. He has been homeless for about 8 years- he states he has tried all the local shelters but is more content living as he does- admits to having some trouble/difficulty with management at shelters. He gets $200 food stamps and has 2 friends- one a [place] who cleans his clothing, sleeping bag, etc and gives him bus passes and one who lets him use his kitchen some. Patient appears to know the system well (housing, [facility], DSS, etc) and prefers to return to his 'home site' as prior to this admission. He states in August he will begin to draw retirement (worked for [employer], [employer], etc.) and will hope to get permanent housing at that time. Patient admits to drinking a pint a Listerine daily for the past year or so- "the police can't get me for drinking it on the streets." States he gets his food from the [employer] and [employer] most days- Patient very pleasant- appears somewhat paranoid at times by statements he makes as well as somewhat a "loner" as he talks about instances he has had with persons in different settings. Uncertain of any mental health issues. CSW offered him shelters but he declines at this time. He appears to have 2 faithful friends who check and help him with needs as they arise (the [place] Gay[place] Face friend was here earlier today getting his belongings to wash and bring back. No further CSW needs identified at this time by CSW or patient.  Reece Levy, MSW, Theresia Majors [phone]

## 2012-04-25 ENCOUNTER — Encounter (HOSPITAL_COMMUNITY): Payer: Self-pay | Admitting: *Deleted

## 2012-04-25 ENCOUNTER — Inpatient Hospital Stay (HOSPITAL_COMMUNITY)
Admission: EM | Admit: 2012-04-25 | Discharge: 2012-04-29 | DRG: 101 | Disposition: A | Discharge status: Home / Self Care | Payer: MEDICAID | Attending: Internal Medicine | Admitting: Internal Medicine

## 2012-04-25 DIAGNOSIS — F10239 Alcohol dependence with withdrawal, unspecified: Secondary | ICD-10-CM | POA: Diagnosis present

## 2012-04-25 DIAGNOSIS — F10939 Alcohol use, unspecified with withdrawal, unspecified: Secondary | ICD-10-CM | POA: Diagnosis present

## 2012-04-25 DIAGNOSIS — T496X1A Poisoning by otorhinolaryngological drugs and preparations, accidental (unintentional), initial encounter: Secondary | ICD-10-CM | POA: Diagnosis present

## 2012-04-25 DIAGNOSIS — G40901 Epilepsy, unspecified, not intractable, with status epilepticus: Secondary | ICD-10-CM | POA: Diagnosis present

## 2012-04-25 DIAGNOSIS — E7800 Pure hypercholesterolemia, unspecified: Secondary | ICD-10-CM | POA: Diagnosis present

## 2012-04-25 DIAGNOSIS — T495X1A Poisoning by ophthalmological drugs and preparations, accidental (unintentional), initial encounter: Secondary | ICD-10-CM | POA: Diagnosis present

## 2012-04-25 DIAGNOSIS — F1010 Alcohol abuse, uncomplicated: Secondary | ICD-10-CM | POA: Diagnosis present

## 2012-04-25 DIAGNOSIS — R569 Unspecified convulsions: Secondary | ICD-10-CM

## 2012-04-25 DIAGNOSIS — R4182 Altered mental status, unspecified: Secondary | ICD-10-CM

## 2012-04-25 DIAGNOSIS — R29898 Other symptoms and signs involving the musculoskeletal system: Secondary | ICD-10-CM | POA: Diagnosis present

## 2012-04-25 DIAGNOSIS — I69998 Other sequelae following unspecified cerebrovascular disease: Secondary | ICD-10-CM

## 2012-04-25 DIAGNOSIS — I6350 Cerebral infarction due to unspecified occlusion or stenosis of unspecified cerebral artery: Secondary | ICD-10-CM | POA: Diagnosis present

## 2012-04-25 DIAGNOSIS — F17200 Nicotine dependence, unspecified, uncomplicated: Secondary | ICD-10-CM | POA: Diagnosis present

## 2012-04-25 DIAGNOSIS — I10 Essential (primary) hypertension: Secondary | ICD-10-CM

## 2012-04-25 DIAGNOSIS — H5316 Psychophysical visual disturbances: Secondary | ICD-10-CM | POA: Diagnosis present

## 2012-04-25 DIAGNOSIS — G40401 Other generalized epilepsy and epileptic syndromes, not intractable, with status epilepticus: Principal | ICD-10-CM | POA: Diagnosis present

## 2012-04-25 DIAGNOSIS — R443 Hallucinations, unspecified: Secondary | ICD-10-CM | POA: Diagnosis present

## 2012-04-25 HISTORY — DX: Alcohol abuse, uncomplicated: F10.10

## 2012-04-25 LAB — COMPREHENSIVE METABOLIC PANEL
Albumin: 4.1 g/dL (ref 3.5–5.2)
BUN: 19 mg/dL (ref 6–23)
Creatinine, Ser: 0.76 mg/dL (ref 0.50–1.35)
Potassium: 4.2 mEq/L (ref 3.5–5.1)
Total Protein: 7.2 g/dL (ref 6.0–8.3)

## 2012-04-25 LAB — CBC WITH DIFFERENTIAL/PLATELET
Basophils Relative: 0 % (ref 0–1)
Eosinophils Absolute: 0.1 10*3/uL (ref 0.0–0.7)
Hemoglobin: 12.1 g/dL — ABNORMAL LOW (ref 13.0–17.0)
MCH: 37.5 pg — ABNORMAL HIGH (ref 26.0–34.0)
MCHC: 34.6 g/dL (ref 30.0–36.0)
Monocytes Relative: 8 % (ref 3–12)
Neutrophils Relative %: 77 % (ref 43–77)
RDW: 14.4 % (ref 11.5–15.5)

## 2012-04-25 LAB — ETHANOL: Alcohol, Ethyl (B): <11 mg/dL (ref 0–11)

## 2012-04-25 LAB — SALICYLATE LEVEL: Salicylate Lvl: <2 mg/dL — ABNORMAL LOW (ref 2.8–20.0)

## 2012-04-25 MED ORDER — LORAZEPAM 2 MG/ML IJ SOLN
INTRAMUSCULAR | Status: AC
  Administered 2012-04-25: 2 mg
  Filled 2012-04-25: qty 1

## 2012-04-25 MED ORDER — LORAZEPAM 2 MG/ML IJ SOLN
1.0000 mg | Freq: Once | INTRAMUSCULAR | Status: AC
  Administered 2012-04-25: 1 mg via INTRAVENOUS

## 2012-04-25 MED ORDER — LEVETIRACETAM 500 MG PO TABS
500.0000 mg | ORAL_TABLET | Freq: Two times a day (BID) | ORAL | Status: DC
  Administered 2012-04-26 – 2012-04-29 (×6): 500 mg via ORAL
  Filled 2012-04-25 (×9): qty 1

## 2012-04-25 MED ORDER — SODIUM CHLORIDE 0.9 % IJ SOLN: 3.0000 mL | Freq: Two times a day (BID) | INTRAMUSCULAR | Status: DC

## 2012-04-25 MED ORDER — VITAMIN B-1 100 MG PO TABS
100.0000 mg | ORAL_TABLET | Freq: Every day | ORAL | Status: DC
  Administered 2012-04-26 – 2012-04-29 (×4): 100 mg via ORAL
  Filled 2012-04-25 (×4): qty 1

## 2012-04-25 MED ORDER — SODIUM CHLORIDE 0.9 % IV SOLN: 1000.0000 mg | Freq: Once | INTRAVENOUS | Status: DC

## 2012-04-25 MED ORDER — LORAZEPAM 2 MG/ML IJ SOLN
INTRAMUSCULAR | Status: AC
  Filled 2012-04-25: qty 1

## 2012-04-25 MED ORDER — LORAZEPAM 2 MG/ML IJ SOLN
1.0000 mg | Freq: Four times a day (QID) | INTRAMUSCULAR | Status: AC | PRN
  Administered 2012-04-26 – 2012-04-27 (×4): 1 mg via INTRAVENOUS
  Filled 2012-04-25 (×5): qty 1

## 2012-04-25 MED ORDER — AMLODIPINE BESYLATE 10 MG PO TABS
10.0000 mg | ORAL_TABLET | Freq: Every day | ORAL | Status: DC
  Administered 2012-04-26 – 2012-04-29 (×4): 10 mg via ORAL
  Filled 2012-04-25 (×4): qty 1

## 2012-04-25 MED ORDER — THIAMINE HCL 100 MG/ML IJ SOLN
100.0000 mg | Freq: Every day | INTRAMUSCULAR | Status: DC
  Filled 2012-04-25 (×2): qty 1

## 2012-04-25 MED ORDER — SODIUM CHLORIDE 0.9 % IJ SOLN: 3.0000 mL | INTRAMUSCULAR | Status: DC | PRN

## 2012-04-25 MED ORDER — CLOPIDOGREL BISULFATE 75 MG PO TABS
75.0000 mg | ORAL_TABLET | Freq: Every day | ORAL | Status: DC
  Administered 2012-04-26 – 2012-04-29 (×4): 75 mg via ORAL
  Filled 2012-04-25 (×5): qty 1

## 2012-04-25 MED ORDER — LORAZEPAM 2 MG/ML IJ SOLN
0.0000 mg | Freq: Two times a day (BID) | INTRAMUSCULAR | Status: AC
  Administered 2012-04-28: 0 mg via INTRAVENOUS
  Administered 2012-04-28: 2 mg via INTRAVENOUS
  Administered 2012-04-29 (×2): 1 mg via INTRAVENOUS
  Filled 2012-04-25 (×3): qty 1

## 2012-04-25 MED ORDER — SODIUM CHLORIDE 0.9 % IV SOLN
1500.0000 mg | Freq: Once | INTRAVENOUS | Status: AC
  Administered 2012-04-25: 1500 mg via INTRAVENOUS
  Filled 2012-04-25: qty 15

## 2012-04-25 MED ORDER — LORAZEPAM 1 MG PO TABS
1.0000 mg | ORAL_TABLET | Freq: Four times a day (QID) | ORAL | Status: AC | PRN
  Administered 2012-04-27: 1 mg via ORAL
  Filled 2012-04-25 (×2): qty 1

## 2012-04-25 MED ORDER — SODIUM CHLORIDE 0.9 % IJ SOLN
3.0000 mL | Freq: Two times a day (BID) | INTRAMUSCULAR | Status: DC
  Administered 2012-04-25: 3 mL via INTRAVENOUS
  Administered 2012-04-26: 10:00:00 via INTRAVENOUS
  Administered 2012-04-26 – 2012-04-29 (×5): 3 mL via INTRAVENOUS

## 2012-04-25 MED ORDER — ADULT MULTIVITAMIN W/MINERALS CH
1.0000 | ORAL_TABLET | Freq: Every day | ORAL | Status: DC
  Administered 2012-04-26 – 2012-04-29 (×4): 1 via ORAL
  Filled 2012-04-25 (×4): qty 1

## 2012-04-25 MED ORDER — LORAZEPAM 2 MG/ML IJ SOLN
0.0000 mg | Freq: Four times a day (QID) | INTRAMUSCULAR | Status: AC
  Administered 2012-04-26: 2 mg via INTRAVENOUS
  Administered 2012-04-27: 1 mg via INTRAVENOUS
  Administered 2012-04-27: 2 mg via INTRAVENOUS
  Filled 2012-04-25 (×4): qty 1

## 2012-04-25 MED ORDER — ENOXAPARIN SODIUM 40 MG/0.4ML ~~LOC~~ SOLN
40.0000 mg | Freq: Every day | SUBCUTANEOUS | Status: DC
  Administered 2012-04-26 – 2012-04-29 (×4): 40 mg via SUBCUTANEOUS
  Filled 2012-04-25 (×4): qty 0.4

## 2012-04-25 MED ORDER — SODIUM CHLORIDE 0.9 % IV SOLN
250.0000 mL | INTRAVENOUS | Status: DC | PRN
  Administered 2012-04-29: 250 mL via INTRAVENOUS

## 2012-04-25 MED ORDER — SODIUM CHLORIDE 0.9 % IV SOLN
1400.0000 mg | Freq: Once | INTRAVENOUS | Status: AC
  Administered 2012-04-25: 1400 mg via INTRAVENOUS
  Filled 2012-04-25: qty 28

## 2012-04-25 MED ORDER — LISINOPRIL 5 MG PO TABS
5.0000 mg | ORAL_TABLET | Freq: Every day | ORAL | Status: DC
  Administered 2012-04-26 – 2012-04-27 (×2): 5 mg via ORAL
  Filled 2012-04-25 (×2): qty 1

## 2012-04-25 MED ORDER — FOLIC ACID 1 MG PO TABS
1.0000 mg | ORAL_TABLET | Freq: Every day | ORAL | Status: DC
  Administered 2012-04-26 – 2012-04-29 (×4): 1 mg via ORAL
  Filled 2012-04-25 (×4): qty 1

## 2012-04-25 MED ORDER — SODIUM CHLORIDE 0.9 % IV BOLUS (SEPSIS)
1000.0000 mL | Freq: Once | INTRAVENOUS | Status: AC
  Administered 2012-04-25: 1000 mL via INTRAVENOUS

## 2012-04-25 NOTE — Progress Notes (Signed)
Patient just started receiving fosphenytoin. Eyes no longer deviated, no clonic movements. He is very sedated, but with repeated noxious stimulation will arouse and give his name.   At this time, I feel that his sedation is medication related given that he was not sedated on arrival and feel that he no longer has signs of ongoing seizure activity.   I would conitnue on keppra monotherapy, 500mg  BID given that he was non-compliant with this.   Ritta Slot, MD [facility] [phone]  If 7pm- 7am, please page neurology on call at [phone].

## 2012-04-25 NOTE — ED Notes (Signed)
Pt arm has stopped moving. Left eye and left side of face continue to twitch. Pt remains alert and answers questions appropriately.

## 2012-04-25 NOTE — ED Notes (Signed)
Pt snoring, awakens to loud stimuli.  NS infusing at [facility].  Pt left eye and cheek twitching less than before

## 2012-04-25 NOTE — ED Notes (Signed)
Pt sleeping awakens easily, remains cooperative

## 2012-04-25 NOTE — ED Notes (Signed)
O2 applied at 2 lpm via [place]. 

## 2012-04-25 NOTE — ED Notes (Signed)
Pt sleeping, Neuro remains at bedside.

## 2012-04-25 NOTE — ED Notes (Signed)
Pt left side of chest, left arm and left side of face with large amt of twitching.  MD at bedside. Seizure pads placed

## 2012-04-25 NOTE — ED Provider Notes (Signed)
History     CSN: [id number]  Arrival date & time 04/25/12  2008   First MD Initiated Contact with Patient 04/25/12 2017      Chief Complaint  Patient presents with  . Delirium Tremens (DTS)    (Consider location/radiation/quality/duration/timing/severity/associated sxs/prior treatment) Patient is a 62 y.o. male presenting with seizures. The history is provided by the patient and the EMS personnel.  Seizures  This is a recurrent problem. The problem has not changed since onset.There was 1 seizure. The most recent episode lasted more than 5 minutes. Pertinent negatives include no confusion, no headaches, no visual disturbance, no chest pain, no nausea, no vomiting and no diarrhea. Characteristics include eye blinking and rhythmic jerking. The episode was witnessed. The seizures continued in the ED. The seizure(s) had left-sided, lower extremity, upper extremity and facial focality. Possible causes include change in alcohol use. There has been no fever. There were no medications administered prior to arrival.  Seizures Progression:  Not changed PTA treatment:  No medications administered   Past Medical History  Diagnosis Date  . Hypercholesteremia   . Stroke   . Hypertension   . Seizures   . Alcohol abuse     History reviewed. No pertinent past surgical history.  History reviewed. No pertinent family history.  History  Substance Use Topics  . Smoking status: Current Every Day Smoker -- 0.25 packs/day    Types: Cigarettes  . Smokeless tobacco: Not on file  . Alcohol Use: Yes     Comment: drinks bottles of 1pint  mouthwash daily      Review of Systems  Constitutional: Negative for fever and fatigue.  HENT: Negative for congestion, rhinorrhea and postnasal drip.   Eyes: Negative for photophobia and visual disturbance.  Respiratory: Negative for chest tightness, shortness of breath and wheezing.   Cardiovascular: Negative for chest pain, palpitations and leg swelling.   Gastrointestinal: Negative for nausea, vomiting, abdominal pain and diarrhea.  Genitourinary: Negative for urgency, frequency and difficulty urinating.  Musculoskeletal: Negative for back pain and arthralgias.  Skin: Negative for rash and wound.  Neurological: Positive for seizures. Negative for weakness and headaches.  Psychiatric/Behavioral: Negative for confusion and agitation.    Allergies  Review of patient's allergies indicates no known allergies.  Home Medications   No current outpatient prescriptions on file.  BP 115/88  Pulse 97  Temp(Src) 98.7 F (37.1 C) (Oral)  Resp 18  Ht 5\' 7"  (1.702 m)  Wt 159 lb 6.3 oz (72.3 kg)  BMI 24.96 kg/m2  SpO2 97%  Physical Exam  Nursing note and vitals reviewed. Constitutional: He is oriented to person, place, and time. He appears well-developed and well-nourished. No distress.  HENT:  Head: Normocephalic and atraumatic.  Mouth/Throat: Oropharynx is clear and moist.  Eyes: EOM are normal. Pupils are equal, round, and reactive to light.  Neck: Normal range of motion. Neck supple.  Cardiovascular: Regular rhythm, normal heart sounds and intact distal pulses.  Tachycardia present.   Pulmonary/Chest: Effort normal and breath sounds normal. He has no wheezes. He has no rales.  Abdominal: Soft. Bowel sounds are normal. He exhibits no distension. There is no tenderness. There is no rebound and no guarding.  Musculoskeletal: Normal range of motion. He exhibits no edema and no tenderness.  Lymphadenopathy:    He has no cervical adenopathy.  Neurological: He is alert and oriented to person, place, and time. He displays normal reflexes. No cranial nerve deficit. He exhibits normal muscle tone. Coordination normal.  Skin:  Skin is warm and dry. No rash noted.  Psychiatric: He has a normal mood and affect. His behavior is normal.    ED Course  Procedures (including critical care time)   Date: 04/26/2012  Rate: 101  Rhythm: sinus  tachycardia  QRS Axis: normal  Intervals: normal  ST/T Wave abnormalities: nonspecific ST changes  Conduction Disutrbances:none  Narrative Interpretation:   Old EKG Reviewed: unchanged    Labs Reviewed  CBC WITH DIFFERENTIAL - Abnormal; Notable for the following:    RBC 3.23 (*)    Hemoglobin 12.1 (*)    HCT 35.0 (*)    MCV 108.4 (*)    MCH 37.5 (*)    All other components within normal limits  COMPREHENSIVE METABOLIC PANEL - Abnormal; Notable for the following:    AST 38 (*)    All other components within normal limits  SALICYLATE LEVEL - Abnormal; Notable for the following:    Salicylate Lvl <2.0 (*)    All other components within normal limits  CBC - Abnormal; Notable for the following:    RBC 3.02 (*)    Hemoglobin 11.1 (*)    HCT 32.9 (*)    MCV 108.9 (*)    MCH 36.8 (*)    All other components within normal limits  CBC - Abnormal; Notable for the following:    RBC 3.02 (*)    Hemoglobin 11.0 (*)    HCT 33.2 (*)    MCV 109.9 (*)    MCH 36.4 (*)    All other components within normal limits  MRSA PCR SCREENING  ETHANOL  ACETAMINOPHEN LEVEL  URINE RAPID DRUG SCREEN (HOSP PERFORMED)  CREATININE, SERUM  BASIC METABOLIC PANEL  BASIC METABOLIC PANEL  CBC   No results found.   1. Status epilepticus   2. Seizure   3. ETOH abuse       MDM  37M [facility] from jail with focal seizure activity that started prior to arrival. Last known drink was about 12 hours ago and was also reportedly trying to drink Listerine between 1-5 pm. On arrival, left arm, face, and leg were shaking. He was able to talk and oriented during this episode. Pt was given 4 Ativan with some improvement but still some seizure activity. Loaded with keppra and neurology was consulted who also recommended fosphenytoin. Treating this as a primary status epilepticus than a withdrawal seizure since he reportedly had Listerine today. Pt now stable. Airway remains intact. Will admit to step down under  teaching service for further management.        Johnnette Gourd, MD 04/26/12 1715

## 2012-04-25 NOTE — ED Notes (Signed)
No change in condition, MD remains at bedside. Pt states he needs "a straight shot of liquor to stop this"  Pt remians cooperative and able to follow commands.  Rt side is not moving EMS repoprts past CVA that affected right side

## 2012-04-25 NOTE — ED Notes (Signed)
Pt from jail, was found with empty bottle of listerine which he may have drank between 1-5 pm.  Pt states he last drank 12 hours ago.  Shaking now, but will respond to questions, track finger.  States "I need a drink"

## 2012-04-25 NOTE — Consult Note (Signed)
Reason for Consult: Seizure Referring Physician: Chaney Malling  CC: Seizure  History is obtained from:Patient, referring physician  HPI: [name] [name] is a 62 y.o. male who was in holding for jail earlier when he began having shaking movements. He was found with an empty bottle of Listerine. He states "two weeks" when I ask how long his twitching has been going on, but is unable to tell me how long today.   He was recently discharged from the hospital following several focal seizures. And was started on Keppra, but has not been taking this.    ROS: unable to obtain due to dysarthria.   Past Medical History  Diagnosis Date  . Hypercholesteremia   . Stroke   . Hypertension   . Seizures   . Alcohol abuse     Family History: Unable to obtain due to dysarthria.   Social History: Drinks mouthwash on a regular basis, found with empty bottle today   Exam: Current vital signs: BP 130/72  Pulse 112  Resp 20  SpO2 99% Vital signs in last 24 hours: Pulse Rate:  [112-140] 112  (02/07 2042) Resp:  [20-28] 20  (02/07 2042) BP: (130-151)/(72-95) 130/72 mmHg (02/07 2042) SpO2:  [98 %-99 %] 99 % (02/07 2042)  General: in bed, Left face is repeatedly twitching.  CV: RRR Mental Status: Patient is lethargic but does rouse with noxious stimulation. Oriented to person, place, month, year.  He does not have any clear aphasia, but is difficult to understand and dysarthric due to partial seizure.   Cranial Nerves: II: able to count fingers in right hemifield, not left. Pupils are equal, round, and reactive to light.  Discs are difficult to visualize. III,IV, VI: Left gaze forced deviation.  V: Facial sensation is symmetric to temperature VII: Facial movement is symmetric.  VIII: hearing is intact to voice X: Uvula elevates symmetrically XI: Shoulder shrug is symmetric. XII: tongue is midline without atrophy or fasciculations.  Motor: Tone is increased in left arm. He follows  commands in bilateral lower extremities as well as right arm. Withdraws left arm to noxious stimuli.   Sensory: Sensation is symmetric to light touch and temperature in the arms and legs.  Deep Tendon Reflexes: 2+ and symmetric in the biceps and patellae.  Plantars: Toes are downgoing bilaterally.  Cerebellar: Does not perform Gait: Not performed due to ongoing seizure activity.   I have reviewed labs in epic and the results pertinent to this consultation are: CBC - mild anemia.  BMP, ethanol level pending.   I have reviewed the images obtained: MRI brain 02/03 - left thalamic infarct. No clear right sided seziure focus, though there is significant atrophy.   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 His lethargy is liekly medication related. With him being awake, alert and oriented, I typically do not suppress this type of patient with general anesthesia, but rather use antiepileptics first. He has had some improvement in the motor manifestations with ativan.   Recommendations: 1) Keppra load 1500mg   2) Fosphenytoin 20 PE/kg if no response to Keppra.  3) Further recommendations following these interventions.    Ritta Slot, MD [facility] [phone]  If 7pm- 7am, please page neurology on call at [phone].

## 2012-04-25 NOTE — ED Notes (Signed)
Neurologist at bedside. Pt able to move all toes when asked. Can move right hand slowly.

## 2012-04-25 NOTE — ED Notes (Signed)
Pt left hans and left corner of mouth twitching.  awaitng meds from pharmacy

## 2012-04-25 NOTE — ED Notes (Signed)
No seizure activity or twitching at present time. Pt is snoring, cont to awaken to loud verbal stimuli

## 2012-04-25 NOTE — ED Notes (Signed)
Face is barely twitching at present time. Pt awakens to loud verbal or physical stimuli. Is oriented x 4

## 2012-04-25 NOTE — ED Notes (Signed)
Neurologist at bedside for evaluation 

## 2012-04-26 ENCOUNTER — Inpatient Hospital Stay (HOSPITAL_COMMUNITY): Payer: Self-pay

## 2012-04-26 DIAGNOSIS — F22 Delusional disorders: Secondary | ICD-10-CM

## 2012-04-26 DIAGNOSIS — G40401 Other generalized epilepsy and epileptic syndromes, not intractable, with status epilepticus: Principal | ICD-10-CM

## 2012-04-26 DIAGNOSIS — R4182 Altered mental status, unspecified: Secondary | ICD-10-CM

## 2012-04-26 DIAGNOSIS — F1010 Alcohol abuse, uncomplicated: Secondary | ICD-10-CM

## 2012-04-26 LAB — CBC
HCT: 32.9 % — ABNORMAL LOW (ref 39.0–52.0)
HCT: 33.2 % — ABNORMAL LOW (ref 39.0–52.0)
Hemoglobin: 11 g/dL — ABNORMAL LOW (ref 13.0–17.0)
Hemoglobin: 11.1 g/dL — ABNORMAL LOW (ref 13.0–17.0)
MCH: 36.4 pg — ABNORMAL HIGH (ref 26.0–34.0)
MCH: 36.8 pg — ABNORMAL HIGH (ref 26.0–34.0)
MCHC: 33.1 g/dL (ref 30.0–36.0)
MCHC: 33.7 g/dL (ref 30.0–36.0)
MCV: 109.9 fL — ABNORMAL HIGH (ref 78.0–100.0)
RDW: 14.4 % (ref 11.5–15.5)
RDW: 14.6 % (ref 11.5–15.5)

## 2012-04-26 LAB — BASIC METABOLIC PANEL
BUN: 16 mg/dL (ref 6–23)
CO2: 25 mEq/L (ref 19–32)
Chloride: 101 mEq/L (ref 96–112)
Creatinine, Ser: 0.67 mg/dL (ref 0.50–1.35)
Glucose, Bld: 82 mg/dL (ref 70–99)
Potassium: 3.7 mEq/L (ref 3.5–5.1)

## 2012-04-26 LAB — RAPID URINE DRUG SCREEN, HOSP PERFORMED
Cocaine: NOT DETECTED
Opiates: NOT DETECTED

## 2012-04-26 MED ORDER — SODIUM CHLORIDE 0.9 % IV SOLN
INTRAVENOUS | Status: DC
  Administered 2012-04-26: 125 mL/h via INTRAVENOUS
  Administered 2012-04-26 – 2012-04-27 (×2): via INTRAVENOUS

## 2012-04-26 NOTE — H&P (Signed)
Internal Medicine Attending Admission Note Date: 04/26/2012  Patient name: [name] [name] Medical record number: [id number] Date of birth: 01-23-1951 Age: 62 y.o. Gender: male  I saw and evaluated the patient. I reviewed the resident's note and I agree with the resident's findings and plan as documented in the resident's note.  Chief Complaint(s): Seizure  History - key components related to admission:  [name] [name] is a 62 year old man with a history of alcohol abuse, thalamic stroke, hyperlipidemia, hypertension, and new onset seizure earlier this week who presents with recurrent seizures after discharge. During the initial hospitalization the seizure workup was unremarkable with a negative CT scan, no acute findings on MRI, and a negative EEG. It was felt he may have alcohol withdrawal seizures given his propensity to drink a whole bottle of mouthwash daily, presumably for the alcohol. He is unable to provide much of a history but apparently he was in a jail holding cell where he was found shaking. He was brought to the emergency department and seen by neurology who felt he was in partial status epilepticus. He was loaded with fosphenytoin, 1500 mg of Keppra, and also received benzodiazepines. He had resolution of his left gaze preference and seizure activity.  When I saw him this morning he was perseverating on the need to lower the bedside rail in order to urinate in the urinal. When I asked other questions he reminded me that he needed to lower the side rail in order to urinate. I instructed him on how to use the urinal within the bed but he felt he would be unable to do so with the side rail up. I was unable to break [name] fixation on the side rail in order to obtain any other meaningful history.  Physical Exam - key components related to admission:  Filed Vitals:   04/26/12 0900 04/26/12 1000 04/26/12 1100 04/26/12 1112  BP: 132/97 122/85 122/78 122/78  Pulse: 84 105 97 93   Temp:    98.5 F (36.9 C)  TempSrc:    Axillary  Resp: 17 24 18 15   Height:      Weight:      SpO2: 100% 98% 93% 95%   General: Well-developed well-nourished disheveled man perseverating about the bedside rail. He was in no other acute distress. Lungs: Clear to auscultation bilaterally. Heart: Regular rate and rhythm without murmurs, rubs, or gallops. Abdomen: Soft, nontender, active bowel sounds. Extremities: Without edema. Neuro: Alert but distracted. Moving all 4 extremities spontaneously. Sensation grossly intact. He was otherwise uncooperative with the exam.  Lab results:  Basic Metabolic Panel:  Recent Labs  40/98/11 2016 04/26/12 04/26/12 0555  NA 139  --  138  K 4.2  --  3.7  CL 100  --  101  CO2 23  --  25  GLUCOSE 85  --  82  BUN 19  --  16  CREATININE 0.76 0.66 0.67  CALCIUM 9.0  --  8.5   Liver Function Tests:  Recent Labs  04/25/12 2016  AST 38*  ALT 27  ALKPHOS 71  BILITOT 0.4  PROT 7.2  ALBUMIN 4.1   CBC:  Recent Labs  04/25/12 2016 04/26/12 04/26/12 0555  WBC 7.6 5.9 6.5  NEUTROABS 5.8  --   --   HGB 12.1* 11.1* 11.0*  HCT 35.0* 32.9* 33.2*  MCV 108.4* 108.9* 109.9*  PLT 187 179 183   Alcohol Level:  Recent Labs  04/25/12 2016  ETH <11   Misc. Labs:  Acetaminophen  and salicylate levels: Undetectable  Pertinent labs during the previous admission: B12, folate, TSH: Unremarkable. RPR: Nonreactive Urine drug screen: Negative  Assessment & Plan by Problem:  [name] is a 62 year old man who is homeless and has a history of alcohol abuse, hypertension, hyperlipidemia, and thalamic stroke who recently developed the onset of seizures. He was discharged on Keppra 500 mg twice a day but returns after being found in his jail cell shaking. It is most likely that he was noncompliant with the Keppra as prescribed. His bizarre behavior continues and suggest there may be some underlying psychiatric issue.  1) Mouth wash overdose:  Poison control was called during the last admission and recommended supportive care.  2) Seizures: Neurology is following. We are continuing the Keppra at 500 mg by mouth twice a day and monitoring closely for evidence of seizure activity. An EEG that was ordered by neurology is pending.  3) Bizarre behavior: I'm afraid he may have some underlying psychiatric disorder which we have been unable to characterize. If his behavior persists over the weekend we will consult psychiatry on Monday to assess for any possible underlying psychiatric disorder we may be missing and needing to treat.  4) Disposition: It is doubtful that [name] [name] will be able to care for himself, at least in the acute setting. We will begin to work on skilled nursing facility placement for rehabilitation.

## 2012-04-26 NOTE — Procedures (Signed)
EEG NUMBER:  [id number].  REFERRING PHYSICIAN:  Rocco Serene  REASON FOR STUDY:  A 62 year old man presenting with status epilepticus with abnormal history of seizures and poor compliance to take the medication as well as history of alcohol abuse.  DESCRIPTION:  This is a routine EEG recording performed during light sleep.  Predominant background activity consisted of irregular, low- amplitude, mixed delta and theta activity symmetrically.  Symmetrical sleep spindles.  Occasional arousal responses were recorded during stage II of sleep.  The patient did not become aroused, wake up at any point during the study.  No epileptiform discharges were recorded.  There were no areas of disproportionate focal slowing.  INTERPRETATION:  This is a normal EEG recording during light sleep.  No evidence of epileptic activity was demonstrated.     Noel Christmas, MD    GU:YQIH D:  04/26/2012 18:19:41  T:  04/26/2012 23:01:57  Job #:  [id number]

## 2012-04-26 NOTE — H&P (Signed)
Hospital Admission Note Date: 04/26/2012  Patient name: [name] [name] Medical record number: [id number] Date of birth: 1950-11-19 Age: 62 y.o. Gender: male PCP: Provider Not In System  Medical Service: [facility]  Attending physician:     1st Contact: Dr. Collier Bullock   Pager:[phone] 2nd Contact: Dr. Manson Passey   Pager:[phone] After 5 pm or weekends: 1st Contact:  Intern on call   Pager: [phone] 2nd Contact:  Resident on call  Pager: [phone]  Chief Complaint: Seizure  History of Present Illness: Level 5 caveat applies; patient is too sedated to answer questions, as such history obtained from the medical record and EDP.  [name] [name] is a 62 year old male with a history of alcohol abuse, homelessness, hypertension, left thalamic CVA in 2011 who was previously admitted to [facility] from 2/3-2/5 for seizures related to alcohol withdrawal.  He is a longterm alcoholic and had been drinking about a pint of mouthwash per day. He was discharged on Keppra on 04/23/12.  Today he presents from a jail holding cell, where he was found shaking and with an empty bottle of Listerine (consumed likely between 1-5pm 04/25/12).  Unable to meaningfully engage in HPI with EDP or neurologist in the ED.  On arrival, left arm, face, and leg were shaking. He was able to talk and oriented during this episode. Pt was given 4 Ativan with some improvement but still some seizure activity. Loaded with keppra and neurology was consulted and added fosphenytoin.   Meds:  Current Outpatient Prescriptions on File Prior to Encounter  Medication Sig Dispense Refill  . amLODipine (NORVASC) 10 MG tablet Take 1 tablet (10 mg total) by mouth daily.  30 tablet  4  . levETIRAcetam (KEPPRA) 500 MG tablet Take 1 tablet (500 mg total) by mouth 2 (two) times daily.  60 tablet  0  . lisinopril (PRINIVIL,ZESTRIL) 5 MG tablet Take 1 tablet (5 mg total) by mouth daily.  30 tablet  4  . Multiple Vitamin (MULTIVITAMIN WITH MINERALS) TABS Take 1 tablet  by mouth daily.        Allergies: Allergies as of 04/25/2012  . (No Known Allergies)   Past Medical History  Diagnosis Date  . Hypercholesteremia   . Stroke   . Hypertension   . Seizures   . Alcohol abuse    History reviewed. No pertinent past surgical history. History reviewed. No pertinent family history. History   Social History  . Marital Status: Divorced    Spouse Name: N/A    Number of Children: N/A  . Years of Education: N/A   Occupational History  . Not on file.   Social History Main Topics  . Smoking status: Current Every Day Smoker -- 0.25 packs/day    Types: Cigarettes  . Smokeless tobacco: Not on file  . Alcohol Use: Yes     Comment: drinks bottles of 1pint  mouthwash daily  . Drug Use: No  . Sexually Active: Not Currently   Other Topics Concern  . Not on file   Social History Narrative  . No narrative on file    Review of Systems: Unable to complete due to patient's mental status  Physical Exam: Blood pressure 133/87, pulse 87, temperature 98.4 F (36.9 C), temperature source Oral, resp. rate 31, height 5\' 7"  (1.702 m), weight 159 lb 6.3 oz (72.3 kg), SpO2 100.00%. Gen: Snoring loudly in bed, will briefly open eyes and moan to vocal stimuli and pain CV: RRR no m/r/g Pulm: CTAB Abd: Normoactive BS, soft and nontender to  mild palpation Extremities: 2+ DP pulses, no edema Neuro: Sleeping, not alert. Moves all extremities spontaneously, withdraws to pain. Will not follow commands.  Lab results: Basic Metabolic Panel:  Recent Labs  16/10/96 1100 04/25/12 2016 04/26/12  NA 136 139  --   K 4.1 4.2  --   CL 98 100  --   CO2 29 23  --   GLUCOSE 109* 85  --   BUN 7 19  --   CREATININE 0.66 0.76 0.66  CALCIUM 9.0 9.0  --    Liver Function Tests:  Recent Labs  04/23/12 1100 04/25/12 2016  AST 26 38*  ALT 20 27  ALKPHOS 66 71  BILITOT 0.5 0.4  PROT 6.9 7.2  ALBUMIN 3.5 4.1   CBC:  Recent Labs  04/25/12 2016 04/26/12  WBC 7.6  5.9  NEUTROABS 5.8  --   HGB 12.1* 11.1*  HCT 35.0* 32.9*  MCV 108.4* 108.9*  PLT 187 179   Anemia Panel:  Recent Labs  04/23/12 0552 04/23/12 1100  VITAMINB12 703  --   FOLATE  --  18.5   Alcohol Level:  Recent Labs  04/25/12 2016  ETH <11    Other results: EKG: Sinus tachycardia w rate 101, normal axis, slightly prolonged QT ( ), No ST elevation/depressions, no TWI. Compared to prior 04/07/12 no significant changes.   Assessment & Plan by Problem: 1) Status epilepticus  Per neurology, treating patient as primary status epilepticus rather than a withdrawal seizure since he reportedly had Listerine today. Fosphenytoin given in ED, discontinued as patient became very sedated. Anion gap is 16, negative ethanol level. 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  At time of our evaluation, he was very sedated, likely from keppra, fosphenytoin and 4mg  IV Ativan in ED. No evidence of ongoing seizure activity. No focal neurological deficits. L gaze preference resolved.  - Keppra 1500mg  load, then monotherpy with 500mg  BID (per neuro) - No repeat imaging - Seizure precautions - CIWA protocol - Would benefit from rehab if agreeable  2) HTN Blood pressure 133/87 in ED. Continue home amlodipine and lisinopril.   Dispo: Disposition is deferred at this time, awaiting improvement of current medical problems. Anticipated discharge in approximately 3 day(s).   Signed: Bronson Curb 04/26/2012, 5:29 AM

## 2012-04-26 NOTE — Progress Notes (Signed)
Portable EEG completed

## 2012-04-26 NOTE — Progress Notes (Signed)
Subjective:  Patient is very sleepy this morning, received Ativan before I examined him. He does seem to recognize me and states "it's good to see a familiar face."  He is unable to answer most questions but states he does not have a headache, any pain, weakness.  Objective: Vital signs in last 24 hours: Filed Vitals:   04/26/12 0500 04/26/12 0600 04/26/12 0700 04/26/12 0822  BP: 133/84 124/81 123/79 119/76  Pulse: 94 94 91 81  Temp: 98.1 F (36.7 C)   98.5 F (36.9 C)  TempSrc: Axillary   Axillary  Resp: 17 16 16 18   Height:      Weight: 159 lb 6.3 oz (72.3 kg)     SpO2: 100% 99% 100% 100%   Weight change:   Intake/Output Summary (Last 24 hours) at 04/26/12 0825 Last data filed at 04/25/12 2353  Gross per 24 hour  Intake      3 ml  Output      0 ml  Net      3 ml    Physical Exam Blood pressure 119/76, pulse 81, temperature 98.5 F (36.9 C), temperature source Axillary, resp. rate 18, height 5\' 7"  (1.702 m), weight 159 lb 6.3 oz (72.3 kg), SpO2 100.00%. General: Resting in bed, minty odor noted, somnolent but opens eyes with sternal rub, minimally cooperative with exam HEENT:  PERRL, 2-73mm, EOM unable to be tested, moist mucous membranes Cardiovascular:  Regular rate and rhythm, no murmurs Respiratory:  Clear to auscultation bilaterally, no wheezes, rales, or rhonchi Abdomen:  Soft, nondistended, nontender, bowel sounds present Extremities:  Warm and well-perfused, no edema.  Skin: Warm, dry, no rashes Neuro: uncooperative with most of exam, oriented to person and hospital, will not follow most commands, no facial asymmetry noted, no dysarthria, moving all four extremities on command. Some intermittent upper extremity rigidity, unclear if voluntary   Lab Results: Basic Metabolic Panel:  Recent Labs Lab 04/21/12 1043 04/21/12 1155  04/25/12 2016 04/26/12 04/26/12 0555  NA 136  --   < > 139  --  138  K 3.7  --   < > 4.2  --  3.7  CL 101  --   < > 100  --  101   CO2  --   --   < > 23  --  25  GLUCOSE 184*  --   < > 85  --  82  BUN 8  --   < > 19  --  16  CREATININE 0.90  --   < > 0.76 0.66 0.67  CALCIUM  --   --   < > 9.0  --  8.5  PHOS  --  2.3  --   --   --   --   < > = values in this interval not displayed. Liver Function Tests:  Recent Labs Lab 04/23/12 1100 04/25/12 2016  AST 26 38*  ALT 20 27  ALKPHOS 66 71  BILITOT 0.5 0.4  PROT 6.9 7.2  ALBUMIN 3.5 4.1    Recent Labs Lab 04/21/12 1155  AMMONIA 40   CBC:  Recent Labs Lab 04/21/12 1034  04/25/12 2016 04/26/12 04/26/12 0555  WBC 3.9*  < > 7.6 5.9 6.5  NEUTROABS 2.7  --  5.8  --   --   HGB 13.0  < > 12.1* 11.1* 11.0*  HCT 38.7*  < > 35.0* 32.9* 33.2*  MCV 110.3*  < > 108.4* 108.9* 109.9*  PLT 134*  < >  187 179 183  < > = values in this interval not displayed. Cardiac Enzymes:  Recent Labs Lab 04/21/12 1034 04/21/12 1647  CKTOTAL  --  103  TROPONINI <0.30  --    Thyroid Function Tests:  Recent Labs Lab 04/22/12 0415  TSH 2.242   Coagulation:  Recent Labs Lab 04/21/12 1034  LABPROT 13.2  INR 1.01   Urine Drug Screen: Drugs of Abuse     Component Value Date/Time   LABOPIA NONE DETECTED 04/22/2012 0346   LABOPIA NEGATIVE 12/30/2009 1339   COCAINSCRNUR NONE DETECTED 04/22/2012 0346   COCAINSCRNUR NEGATIVE 12/30/2009 1339   LABBENZ POSITIVE* 04/22/2012 0346   LABBENZ NEGATIVE 12/30/2009 1339   AMPHETMU NONE DETECTED 04/22/2012 0346   AMPHETMU NEGATIVE 12/30/2009 1339   THCU NONE DETECTED 04/22/2012 0346   LABBARB NONE DETECTED 04/22/2012 0346    Alcohol Level:  Recent Labs Lab 04/21/12 1034 04/25/12 2016  ETH <11 <11   Urinalysis:  Recent Labs Lab 04/22/12 0346  COLORURINE YELLOW  LABSPEC 1.019  PHURINE 7.5  GLUCOSEU NEGATIVE  HGBUR NEGATIVE  BILIRUBINUR NEGATIVE  KETONESUR 15*  PROTEINUR NEGATIVE  UROBILINOGEN 0.2  NITRITE NEGATIVE  LEUKOCYTESUR NEGATIVE     Micro Results: Recent Results (from the past 240 hour(s))  MRSA PCR  SCREENING     Status: None   Collection Time    04/21/12  7:31 PM      Result Value Range Status   MRSA by PCR NEGATIVE  NEGATIVE Final   Comment:            The GeneXpert MRSA Assay (FDA     approved for NASAL specimens     only), is one component of a     comprehensive MRSA colonization     surveillance program. It is not     intended to diagnose MRSA     infection nor to guide or     monitor treatment for     MRSA infections.  MRSA PCR SCREENING     Status: None   Collection Time    04/25/12 11:54 PM      Result Value Range Status   MRSA by PCR NEGATIVE  NEGATIVE Final   Comment:            The GeneXpert MRSA Assay (FDA     approved for NASAL specimens     only), is one component of a     comprehensive MRSA colonization     surveillance program. It is not     intended to diagnose MRSA     infection nor to guide or     monitor treatment for     MRSA infections.   Studies/Results: No results found. Medications: Reviewed Scheduled Meds: . amLODipine  10 mg Oral Daily  . clopidogrel  75 mg Oral Q breakfast  . enoxaparin (LOVENOX) injection  40 mg Subcutaneous Daily  . folic acid  1 mg Oral Daily  . levETIRAcetam  500 mg Oral BID  . lisinopril  5 mg Oral Daily  . LORazepam  0-4 mg Intravenous Q6H   Followed by  . [START ON 04/28/2012] LORazepam  0-4 mg Intravenous Q12H  . multivitamin with minerals  1 tablet Oral Daily  . sodium chloride  3 mL Intravenous Q12H  . thiamine  100 mg Oral Daily   Or  . thiamine  100 mg Intravenous Daily   Continuous Infusions:  PRN Meds:.sodium chloride, LORazepam, LORazepam, sodium chloride  Assessment/Plan:  62 year old male with  history of alcohol/mouthwash abuse, hypertension, previous left thalamic stroke, presents with status epilepticus with left-gaze preference.   Status epilepticus Patient presents with primary status epilepticus with left gaze preference. Unclear etiology, but patient was noncompliant with Keppra. Patient  given fosphenytoin and Keppra load in the ED as well as IV Ativan. Left gaze preference resolved shortly afterwards. Per neurology, plan for monotherapy with Keppra 500 mg twice a day.  CT of the head from previous admission did not show any evidence of hemorrhage or infarction, no other intracranial abnormalities or seizure focus, MRI without any acute or subacute infarct.  2/8: Patient sedated on exam this morning. No focal neurologic deficits detected. No left gaze preference.  -Continue Keppra -Continue CIWA protocol  Paranoia - noted during last admission Patient with paranoid features and some odd behavior. Doesn't meet criteria for schizophrenia and patient does not carry any diagnosis that we know of. Perhaps some schizoid personality. Given that patient is functional and not having active hallucinations, suicidal or homicidal ideations, no interventions need at this time. Pt can follow up with [facility].  Alcohol abuse - chronic since ~2000 Patient states that he has been drinking about a pint of mouthwash per day for the past month. Potentially harmful ingredients in mouthwash include chlorhexidine gluconate, ethanol, hydrogen peroxide, and methyl salicylate.  Liver enzymes on admission are within normal limits.  No signs of withdrawal during this admission  -cont CIWA protocol  -cont Thiamine, folic acid, multivitamin  -counseled against using mouthwash and alcohol, offered resources and counseling  Hypertension  Patient is hypertensive on admission. 158/90. his home medications include lisinopril and amlodipine, but he has been out of these medications for several months. He was restarted on these during his last hospitalization, patient did not fill prescriptions.  -cont home lisinopril, norvasc  Thrombocytopenia  Patient has a history of thrombocytopenia. Platelets are 187 on admission. No signs of active bleeding, can do knee to monitor. Likely from chronic alcohol use.   Continue to monitor  Hx of Stroke -cont home Plavix  DVT  Lovenox   Diet Regular  Dispo:  Will ask SW to assist with placement   The patient does not have a current PCP The patient does have transportation limitations that hinder transportation to clinic appointments.     LOS: 1 day   [place] Ar 04/26/2012, 8:25 AM

## 2012-04-27 ENCOUNTER — Inpatient Hospital Stay (HOSPITAL_COMMUNITY): Payer: Self-pay

## 2012-04-27 LAB — BASIC METABOLIC PANEL
BUN: 9 mg/dL (ref 6–23)
Calcium: 8.6 mg/dL (ref 8.4–10.5)
Calcium: 8.6 mg/dL (ref 8.4–10.5)
GFR calc Af Amer: >90 mL/min (ref >90)
GFR calc non Af Amer: >90 mL/min (ref >90)
GFR calc non Af Amer: >90 mL/min (ref >90)
Potassium: 3.6 mEq/L (ref 3.5–5.1)
Sodium: 136 mEq/L (ref 135–145)
Sodium: 135 mEq/L (ref 135–145)

## 2012-04-27 LAB — CBC
MCH: 36.9 pg — ABNORMAL HIGH (ref 26.0–34.0)
Platelets: 192 10*3/uL (ref 150–400)
RBC: 3.36 MIL/uL — ABNORMAL LOW (ref 4.22–5.81)
WBC: 5.2 10*3/uL (ref 4.0–10.5)

## 2012-04-27 LAB — GLUCOSE, CAPILLARY: Glucose-Capillary: 130 mg/dL — ABNORMAL HIGH (ref 70–99)

## 2012-04-27 LAB — LACTIC ACID, PLASMA: Lactic Acid, Venous: 1.3 mmol/L (ref 0.5–2.2)

## 2012-04-27 MED ORDER — LISINOPRIL 10 MG PO TABS
10.0000 mg | ORAL_TABLET | Freq: Every day | ORAL | Status: DC
  Administered 2012-04-28 – 2012-04-29 (×2): 10 mg via ORAL
  Filled 2012-04-27 (×2): qty 1

## 2012-04-27 MED ORDER — HYDRALAZINE HCL 20 MG/ML IJ SOLN
10.0000 mg | INTRAMUSCULAR | Status: DC | PRN
  Filled 2012-04-27: qty 0.5

## 2012-04-27 MED ORDER — DEXTROSE-NACL 5-0.9 % IV SOLN
INTRAVENOUS | Status: DC
  Administered 2012-04-27 – 2012-04-29 (×7): via INTRAVENOUS

## 2012-04-27 NOTE — Progress Notes (Signed)
Patient arrived to floor. Upon review of orders, patient was to be on cardiac monitoring, but the level of care placement was just for medsurg. Internal Medicine called to clarify orders. Stated the patient did not need telemetry and they would discontinue the order.

## 2012-04-27 NOTE — Progress Notes (Signed)
Subjective: The patient continued to require ativan overnight for agitation.  During today's conversation, patient perseverates on a locker at the [facility] that he believes contains his belongings.  The patient states he has no recollection of the last 2 days.  Objective: Vital signs in last 24 hours: Filed Vitals:   04/27/12 0600 04/27/12 0800 04/27/12 1138 04/27/12 1237  BP: 154/94 142/91 171/105 137/100  Pulse: 81 81 84 89  Temp:  97.3 F (36.3 C) 97.9 F (36.6 C)   TempSrc:  Oral Oral   Resp: 14 17 14 17   Height:      Weight:      SpO2: 100% 99% 100% 100%   Weight change: -2 lb 10.3 oz (-1.2 kg)  Intake/Output Summary (Last 24 hours) at 04/27/12 1424 Last data filed at 04/27/12 1235  Gross per 24 hour  Intake   2148 ml  Output   2200 ml  Net    -52 ml    Physical Exam Blood pressure 137/100, pulse 89, temperature 97.9 F (36.6 C), temperature source Oral, resp. rate 17, height 5\' 7"  (1.702 m), weight 156 lb 12 oz (71.1 kg), SpO2 100.00%. General: Resting in bed, minimally cooperative with exam HEENT:  PERRL, 2-64mm, EOM unable to be tested, moist mucous membranes Cardiovascular:  Regular rate and rhythm, no murmurs Respiratory:  Clear to auscultation bilaterally, no wheezes, rales, or rhonchi Abdomen:  Soft, nondistended, nontender, bowel sounds present Extremities:  Warm and well-perfused, no edema.  Skin: Warm, dry, no rashes Neuro: uncooperative with most of exam, oriented to person and hospital, will not follow most commands, no facial asymmetry noted, no dysarthria, moving all four extremities on command.    Lab Results: Basic Metabolic Panel:  Recent Labs Lab 04/21/12 1043 04/21/12 1155  04/27/12 0525 04/27/12 1015  NA 136  --   < > 136 135  K 3.7  --   < > 4.0 3.6  CL 101  --   < > 101 101  CO2  --   --   < > 19 21  GLUCOSE 184*  --   < > 66* 102*  BUN 8  --   < > 11 9  CREATININE 0.90  --   < > 0.55 0.51  CALCIUM  --   --   < > 8.6 8.6  PHOS  --  2.3   --   --   --   < > = values in this interval not displayed. Liver Function Tests:  Recent Labs Lab 04/23/12 1100 04/25/12 2016  AST 26 38*  ALT 20 27  ALKPHOS 66 71  BILITOT 0.5 0.4  PROT 6.9 7.2  ALBUMIN 3.5 4.1    Recent Labs Lab 04/21/12 1155  AMMONIA 40   CBC:  Recent Labs Lab 04/21/12 1034  04/25/12 2016  04/26/12 0555 04/27/12 0525  WBC 3.9*  < > 7.6  < > 6.5 5.2  NEUTROABS 2.7  --  5.8  --   --   --   HGB 13.0  < > 12.1*  < > 11.0* 12.4*  HCT 38.7*  < > 35.0*  < > 33.2* 36.9*  MCV 110.3*  < > 108.4*  < > 109.9* 109.8*  PLT 134*  < > 187  < > 183 192  < > = values in this interval not displayed. Cardiac Enzymes:  Recent Labs Lab 04/21/12 1034 04/21/12 1647 04/27/12 1015  CKTOTAL  --  103 527*  TROPONINI <0.30  --   --  Thyroid Function Tests:  Recent Labs Lab 04/22/12 0415  TSH 2.242   Coagulation:  Recent Labs Lab 04/21/12 1034  LABPROT 13.2  INR 1.01   Urine Drug Screen: Drugs of Abuse     Component Value Date/Time   LABOPIA NONE DETECTED 04/26/2012 1012   LABOPIA NEGATIVE 12/30/2009 1339   COCAINSCRNUR NONE DETECTED 04/26/2012 1012   COCAINSCRNUR NEGATIVE 12/30/2009 1339   LABBENZ NONE DETECTED 04/26/2012 1012   LABBENZ NEGATIVE 12/30/2009 1339   AMPHETMU NONE DETECTED 04/26/2012 1012   AMPHETMU NEGATIVE 12/30/2009 1339   THCU NONE DETECTED 04/26/2012 1012   LABBARB NONE DETECTED 04/26/2012 1012    Alcohol Level:  Recent Labs Lab 04/21/12 1034 04/25/12 2016  ETH <11 <11   Urinalysis:  Recent Labs Lab 04/22/12 0346  COLORURINE YELLOW  LABSPEC 1.019  PHURINE 7.5  GLUCOSEU NEGATIVE  HGBUR NEGATIVE  BILIRUBINUR NEGATIVE  KETONESUR 15*  PROTEINUR NEGATIVE  UROBILINOGEN 0.2  NITRITE NEGATIVE  LEUKOCYTESUR NEGATIVE     Micro Results: Recent Results (from the past 240 hour(s))  MRSA PCR SCREENING     Status: None   Collection Time    04/21/12  7:31 PM      Result Value Range Status   MRSA by PCR NEGATIVE  NEGATIVE  Final   Comment:            The GeneXpert MRSA Assay (FDA     approved for NASAL specimens     only), is one component of a     comprehensive MRSA colonization     surveillance program. It is not     intended to diagnose MRSA     infection nor to guide or     monitor treatment for     MRSA infections.  MRSA PCR SCREENING     Status: None   Collection Time    04/25/12 11:54 PM      Result Value Range Status   MRSA by PCR NEGATIVE  NEGATIVE Final   Comment:            The GeneXpert MRSA Assay (FDA     approved for NASAL specimens     only), is one component of a     comprehensive MRSA colonization     surveillance program. It is not     intended to diagnose MRSA     infection nor to guide or     monitor treatment for     MRSA infections.   Studies/Results: Dg Chest 2 View  04/27/2012  *RADIOLOGY REPORT*  Clinical Data: Status epilepticus, gap acidosis  CHEST - 2 VIEW  Comparison: Prior chest x-ray 04/21/2012  Findings: Stable appearance of the lungs with bibasilar patchy opacities and mild cardiomegaly.  No new airspace consolidation, pleural effusion or pneumothorax.  Chronic central bronchitic changes are unchanged. No acute osseous abnormality.  IMPRESSION: No significant interval change in the appearance of the chest with chronic bibasilar patchy opacities.   Original Report Authenticated By: Malachy Moan, M.D.    Medications: Reviewed Scheduled Meds: . amLODipine  10 mg Oral Daily  . clopidogrel  75 mg Oral Q breakfast  . enoxaparin (LOVENOX) injection  40 mg Subcutaneous Daily  . folic acid  1 mg Oral Daily  . levETIRAcetam  500 mg Oral BID  . lisinopril  5 mg Oral Daily  . LORazepam  0-4 mg Intravenous Q6H   Followed by  . [START ON 04/28/2012] LORazepam  0-4 mg Intravenous Q12H  . multivitamin with minerals  1 tablet Oral Daily  . sodium chloride  3 mL Intravenous Q12H  . thiamine  100 mg Oral Daily   Continuous Infusions: . dextrose 5 % and 0.9% NaCl 125 mL/hr  at 04/27/12 0831   PRN Meds:.sodium chloride, LORazepam, LORazepam, sodium chloride  Assessment/Plan:  62 year old male with history of alcohol/mouthwash abuse, hypertension, previous left thalamic stroke, presents with status epilepticus with left-gaze preference.   Status epilepticus The patient again presents in status epilepticus, likely representing alcohol withdrawal vs listerine overdose.  Patient given fosphenytoin and Keppra load in the ED as well as IV Ativan.  CT of the head from previous admission did not show any evidence of hemorrhage or infarction, no other intracranial abnormalities or seizure focus, MRI without any acute or subacute infarct.  -Appreciate neurology consult -Continue Keppra -continue ativan prn for irritability, continue CIWA protocol  Paranoia - noted during last admission Patient with paranoid features and some odd behavior. Doesn't meet criteria for schizophrenia and patient does not carry any diagnosis that we know of. Perhaps some schizoid personality. -consider psych consult to diagnose any underlying psychiatric issue we may be missing  Alcohol abuse - chronic since ~2000 Patient states that he has been drinking about a pint of mouthwash per day for the past month. Potentially harmful ingredients in mouthwash include chlorhexidine gluconate, ethanol, hydrogen peroxide, and methyl salicylate.  Liver enzymes on admission are within normal limits.  -cont CIWA protocol  -cont Thiamine, folic acid, multivitamin  -counseled against using mouthwash and alcohol, offered resources and counseling  Hypertension  The patient has a history of hypertension and medication non-compliance.  His current hypertension may also have some component of agitation/withdrawal. -continue norvasc -increase lisinopril to 10 mg/day, starting tomorrow -added hydralazine prn for SBP > 180  Thrombocytopenia  Patient has a history of thrombocytopenia. Platelets are 187 on  admission, likely from chronic alcohol use.  No active bleeding. -Continue to monitor  Hx of Stroke -cont home Plavix  DVT  Lovenox   Diet Regular  Dispo:  Will ask SW to assist with placement   The patient does not have a current PCP The patient does have transportation limitations that hinder transportation to clinic appointments.     LOS: 2 days   Janalyn Harder 04/27/2012, 2:24 PM

## 2012-04-27 NOTE — Progress Notes (Addendum)
Subjective: No recurrence of seizure activity. Patient has been having hallucinations and is remained confused and to some extent agitated requiring round-the-clock benzodiazepine medication.  Objective: Current vital signs: BP 142/91  Pulse 81  Temp(Src) 97.3 F (36.3 C) (Oral)  Resp 17  Ht 5\' 7"  (1.702 m)  Wt 71.1 kg (156 lb 12 oz)  BMI 24.54 kg/m2  SpO2 99%  Neurologic Exam: Alert and in no acute distress. Oriented to place but disoriented to time. He is insisting that his skin is wet, and in some feels slimy. Pupils were equal and reacted normally to light. Extraocular movements were full and conjugate. Face was symmetrical. No dysarthria. Moved extremities equally with normal strength throughout.  Lab Results: Results for orders placed during the hospital encounter of 04/25/12 (from the past 48 hour(s))  CBC WITH DIFFERENTIAL     Status: Abnormal   Collection Time    04/25/12  8:16 PM      Result Value Range   WBC 7.6  4.0 - 10.5 K/uL   RBC 3.23 (*) 4.22 - 5.81 MIL/uL   Hemoglobin 12.1 (*) 13.0 - 17.0 g/dL   HCT 78.2 (*) 95.6 - 21.3 %   MCV 108.4 (*) 78.0 - 100.0 fL   MCH 37.5 (*) 26.0 - 34.0 pg   MCHC 34.6  30.0 - 36.0 g/dL   RDW 08.6  57.8 - 46.9 %   Platelets 187  150 - 400 K/uL   Neutrophils Relative 77  43 - 77 %   Neutro Abs 5.8  1.7 - 7.7 K/uL   Lymphocytes Relative 14  12 - 46 %   Lymphs Abs 1.1  0.7 - 4.0 K/uL   Monocytes Relative 8  3 - 12 %   Monocytes Absolute 0.6  0.1 - 1.0 K/uL   Eosinophils Relative 1  0 - 5 %   Eosinophils Absolute 0.1  0.0 - 0.7 K/uL   Basophils Relative 0  0 - 1 %   Basophils Absolute 0.0  0.0 - 0.1 K/uL  COMPREHENSIVE METABOLIC PANEL     Status: Abnormal   Collection Time    04/25/12  8:16 PM      Result Value Range   Sodium 139  135 - 145 mEq/L   Potassium 4.2  3.5 - 5.1 mEq/L   Chloride 100  96 - 112 mEq/L   CO2 23  19 - 32 mEq/L   Glucose, Bld 85  70 - 99 mg/dL   BUN 19  6 - 23 mg/dL   Creatinine, Ser 6.29  0.50 -  1.35 mg/dL   Calcium 9.0  8.4 - 52.8 mg/dL   Total Protein 7.2  6.0 - 8.3 g/dL   Albumin 4.1  3.5 - 5.2 g/dL   AST 38 (*) 0 - 37 U/L   ALT 27  0 - 53 U/L   Alkaline Phosphatase 71  39 - 117 U/L   Total Bilirubin 0.4  0.3 - 1.2 mg/dL   GFR calc non Af Amer >90  >90 mL/min   GFR calc Af Amer >90  >90 mL/min   Comment:            The eGFR has been calculated     using the CKD EPI equation.     This calculation has not been     validated in all clinical     situations.     eGFR's persistently     <90 mL/min signify     possible Chronic Kidney  Disease.  ETHANOL     Status: None   Collection Time    04/25/12  8:16 PM      Result Value Range   Alcohol, Ethyl (B) <11  0 - 11 mg/dL   Comment:            LOWEST DETECTABLE LIMIT FOR     SERUM ALCOHOL IS 11 mg/dL     FOR MEDICAL PURPOSES ONLY  SALICYLATE LEVEL     Status: Abnormal   Collection Time    04/25/12  8:16 PM      Result Value Range   Salicylate Lvl <2.0 (*) 2.8 - 20.0 mg/dL  ACETAMINOPHEN LEVEL     Status: None   Collection Time    04/25/12  8:16 PM      Result Value Range   Acetaminophen (Tylenol), Serum <15.0  10 - 30 ug/mL   Comment:            THERAPEUTIC CONCENTRATIONS VARY     SIGNIFICANTLY. A RANGE OF 10-30     ug/mL MAY BE AN EFFECTIVE     CONCENTRATION FOR MANY PATIENTS.     HOWEVER, SOME ARE BEST TREATED     AT CONCENTRATIONS OUTSIDE THIS     RANGE.     ACETAMINOPHEN CONCENTRATIONS     >150 ug/mL AT 4 HOURS AFTER     INGESTION AND >50 ug/mL AT 12     HOURS AFTER INGESTION ARE     OFTEN ASSOCIATED WITH TOXIC     REACTIONS.  MRSA PCR SCREENING     Status: None   Collection Time    04/25/12 11:54 PM      Result Value Range   MRSA by PCR NEGATIVE  NEGATIVE   Comment:            The GeneXpert MRSA Assay (FDA     approved for NASAL specimens     only), is one component of a     comprehensive MRSA colonization     surveillance program. It is not     intended to diagnose MRSA     infection nor to guide  or     monitor treatment for     MRSA infections.  CBC     Status: Abnormal   Collection Time    04/26/12 12:00 AM      Result Value Range   WBC 5.9  4.0 - 10.5 K/uL   RBC 3.02 (*) 4.22 - 5.81 MIL/uL   Hemoglobin 11.1 (*) 13.0 - 17.0 g/dL   HCT 40.9 (*) 81.1 - 91.4 %   MCV 108.9 (*) 78.0 - 100.0 fL   MCH 36.8 (*) 26.0 - 34.0 pg   MCHC 33.7  30.0 - 36.0 g/dL   RDW 78.2  95.6 - 21.3 %   Platelets 179  150 - 400 K/uL  CREATININE, SERUM     Status: None   Collection Time    04/26/12 12:00 AM      Result Value Range   Creatinine, Ser 0.66  0.50 - 1.35 mg/dL   GFR calc non Af Amer >90  >90 mL/min   GFR calc Af Amer >90  >90 mL/min   Comment:            The eGFR has been calculated     using the CKD EPI equation.     This calculation has not been     validated in all clinical     situations.  eGFR's persistently     <90 mL/min signify     possible Chronic Kidney Disease.  BASIC METABOLIC PANEL     Status: None   Collection Time    04/26/12  5:55 AM      Result Value Range   Sodium 138  135 - 145 mEq/L   Potassium 3.7  3.5 - 5.1 mEq/L   Chloride 101  96 - 112 mEq/L   CO2 25  19 - 32 mEq/L   Glucose, Bld 82  70 - 99 mg/dL   BUN 16  6 - 23 mg/dL   Creatinine, Ser 7.82  0.50 - 1.35 mg/dL   Calcium 8.5  8.4 - 95.6 mg/dL   GFR calc non Af Amer >90  >90 mL/min   GFR calc Af Amer >90  >90 mL/min   Comment:            The eGFR has been calculated     using the CKD EPI equation.     This calculation has not been     validated in all clinical     situations.     eGFR's persistently     <90 mL/min signify     possible Chronic Kidney Disease.  CBC     Status: Abnormal   Collection Time    04/26/12  5:55 AM      Result Value Range   WBC 6.5  4.0 - 10.5 K/uL   RBC 3.02 (*) 4.22 - 5.81 MIL/uL   Hemoglobin 11.0 (*) 13.0 - 17.0 g/dL   HCT 21.3 (*) 08.6 - 57.8 %   MCV 109.9 (*) 78.0 - 100.0 fL   MCH 36.4 (*) 26.0 - 34.0 pg   MCHC 33.1  30.0 - 36.0 g/dL   RDW 46.9  62.9 -  52.8 %   Platelets 183  150 - 400 K/uL  URINE RAPID DRUG SCREEN (HOSP PERFORMED)     Status: None   Collection Time    04/26/12 10:12 AM      Result Value Range   Opiates NONE DETECTED  NONE DETECTED   Cocaine NONE DETECTED  NONE DETECTED   Benzodiazepines NONE DETECTED  NONE DETECTED   Amphetamines NONE DETECTED  NONE DETECTED   Tetrahydrocannabinol NONE DETECTED  NONE DETECTED   Barbiturates NONE DETECTED  NONE DETECTED   Comment:            DRUG SCREEN FOR MEDICAL PURPOSES     ONLY.  IF CONFIRMATION IS NEEDED     FOR ANY PURPOSE, NOTIFY LAB     WITHIN 5 DAYS.                LOWEST DETECTABLE LIMITS     FOR URINE DRUG SCREEN     Drug Class       Cutoff (ng/mL)     Amphetamine      1000     Barbiturate      200     Benzodiazepine   200     Tricyclics       300     Opiates          300     Cocaine          300     THC              50  BASIC METABOLIC PANEL     Status: Abnormal   Collection Time    04/27/12  5:25 AM  Result Value Range   Sodium 136  135 - 145 mEq/L   Potassium 4.0  3.5 - 5.1 mEq/L   Chloride 101  96 - 112 mEq/L   CO2 19  19 - 32 mEq/L   Glucose, Bld 66 (*) 70 - 99 mg/dL   BUN 11  6 - 23 mg/dL   Creatinine, Ser 5.40  0.50 - 1.35 mg/dL   Calcium 8.6  8.4 - 98.1 mg/dL   GFR calc non Af Amer >90  >90 mL/min   GFR calc Af Amer >90  >90 mL/min   Comment:            The eGFR has been calculated     using the CKD EPI equation.     This calculation has not been     validated in all clinical     situations.     eGFR's persistently     <90 mL/min signify     possible Chronic Kidney Disease.  CBC     Status: Abnormal   Collection Time    04/27/12  5:25 AM      Result Value Range   WBC 5.2  4.0 - 10.5 K/uL   RBC 3.36 (*) 4.22 - 5.81 MIL/uL   Hemoglobin 12.4 (*) 13.0 - 17.0 g/dL   HCT 19.1 (*) 47.8 - 29.5 %   MCV 109.8 (*) 78.0 - 100.0 fL   MCH 36.9 (*) 26.0 - 34.0 pg   MCHC 33.6  30.0 - 36.0 g/dL   RDW 62.1  30.8 - 65.7 %   Platelets 192  150 -  400 K/uL    Studies/Results: Dg Chest 2 View  04/27/2012  *RADIOLOGY REPORT*  Clinical Data: Status epilepticus, gap acidosis  CHEST - 2 VIEW  Comparison: Prior chest x-ray 04/21/2012  Findings: Stable appearance of the lungs with bibasilar patchy opacities and mild cardiomegaly.  No new airspace consolidation, pleural effusion or pneumothorax.  Chronic central bronchitic changes are unchanged. No acute osseous abnormality.  IMPRESSION: No significant interval change in the appearance of the chest with chronic bibasilar patchy opacities.   Original Report Authenticated By: Malachy Moan, M.D.     Medications:  I have reviewed the patient's current medications. Scheduled: . amLODipine  10 mg Oral Daily  . clopidogrel  75 mg Oral Q breakfast  . enoxaparin (LOVENOX) injection  40 mg Subcutaneous Daily  . folic acid  1 mg Oral Daily  . levETIRAcetam  500 mg Oral BID  . lisinopril  5 mg Oral Daily  . LORazepam  0-4 mg Intravenous Q6H   Followed by  . [START ON 04/28/2012] LORazepam  0-4 mg Intravenous Q12H  . multivitamin with minerals  1 tablet Oral Daily  . sodium chloride  3 mL Intravenous Q12H  . thiamine  100 mg Oral Daily   Or  . thiamine  100 mg Intravenous Daily   Continuous: . dextrose 5 % and 0.9% NaCl 125 mL/hr at 04/27/12 0831   QIO:NGEXBM chloride, LORazepam, LORazepam, sodium chloride  Assessment/Plan: Continue confusion with hallucinations indicative of acute delirium. Symptoms probably related to overdose of Listerine as well as alcohol withdrawal. EEG showed no signs of epileptic activity.  Recommend no changes in current management with use of diazepam medication as needed for management of agitation and confusion.  No further neurodiagnostic studies are indicated at this point. Agree with obtaining psychiatry consult.  C.R. Roseanne Reno, MD [facility] [phone]  04/27/2012  9:45 AM

## 2012-04-28 DIAGNOSIS — D696 Thrombocytopenia, unspecified: Secondary | ICD-10-CM

## 2012-04-28 DIAGNOSIS — R443 Hallucinations, unspecified: Secondary | ICD-10-CM | POA: Diagnosis present

## 2012-04-28 DIAGNOSIS — F0390 Unspecified dementia without behavioral disturbance: Secondary | ICD-10-CM

## 2012-04-28 DIAGNOSIS — F1020 Alcohol dependence, uncomplicated: Secondary | ICD-10-CM

## 2012-04-28 LAB — BASIC METABOLIC PANEL
Chloride: 103 mEq/L (ref 96–112)
Creatinine, Ser: 0.57 mg/dL (ref 0.50–1.35)
GFR calc Af Amer: >90 mL/min (ref >90)
GFR calc non Af Amer: >90 mL/min (ref >90)

## 2012-04-28 LAB — GLUCOSE, CAPILLARY

## 2012-04-28 LAB — CBC
HCT: 37.3 % — ABNORMAL LOW (ref 39.0–52.0)
Platelets: 219 10*3/uL (ref 150–400)
RDW: 13.5 % (ref 11.5–15.5)
WBC: 4.6 10*3/uL (ref 4.0–10.5)

## 2012-04-28 MED ORDER — POTASSIUM CHLORIDE CRYS ER 20 MEQ PO TBCR
40.0000 meq | EXTENDED_RELEASE_TABLET | Freq: Once | ORAL | Status: AC
  Administered 2012-04-28: 40 meq via ORAL
  Filled 2012-04-28: qty 2

## 2012-04-28 MED ORDER — POLYVINYL ALCOHOL 1.4 % OP SOLN
1.0000 [drp] | OPHTHALMIC | Status: DC | PRN
  Filled 2012-04-28: qty 15

## 2012-04-28 NOTE — Progress Notes (Signed)
Pt lying in bed, sleeping.  Easily arouses to voice.  No s/s of any acute distress noted.  No c/o pain.  1:1 sitter at bedside and attentive.  Call bell in reach.

## 2012-04-28 NOTE — Progress Notes (Signed)
Subjective: [name] [name] is sitting in bed this morning. He is feeling well except some chronic back pain. He states that he started seeing spiders on the walls, bedsheets, and his food yesterday. He knows they aren't really there but he continues to see them. He denies other auditory hallucinations.  Does not remember why he is here or why he was in jail. Denies headache, weakness, numbness.  Had a fall last night, hurt his arm but states it is OK today.  Objective: Vital signs in last 24 hours: Filed Vitals:   04/27/12 2204 04/28/12 0020 04/28/12 0524 04/28/12 0938  BP: 154/102 124/68 148/78 150/99  Pulse: 94 89 86 88  Temp: 97.6 F (36.4 C) 98.4 F (36.9 C) 98.1 F (36.7 C) 97.1 F (36.2 C)  TempSrc: Oral Oral Oral Oral  Resp: 18 18 20 18   Height:      Weight:      SpO2: 100%  100% 100%   Weight change:   Intake/Output Summary (Last 24 hours) at 04/28/12 1036 Last data filed at 04/28/12 0650  Gross per 24 hour  Intake   1875 ml  Output   2300 ml  Net   -425 ml    Physical Exam Blood pressure 150/99, pulse 88, temperature 97.1 F (36.2 C), temperature source Oral, resp. rate 18, height 5\' 7"  (1.702 m), weight 156 lb 12 oz (71.1 kg), SpO2 100.00%. General: Up in chair, NAD HEENT:  PERRL, 2-66mm, EOM intact, moist mucous membranes Cardiovascular:  Regular rate and rhythm, no murmurs Respiratory:  Clear to auscultation bilaterally, no wheezes, rales, or rhonchi Abdomen:  Soft, nondistended, nontender, bowel sounds present Extremities:  Warm and well-perfused, no edema.  Skin: Warm, dry, no rashes Neuro: oriented to person and hospital, will follow most commands, no facial asymmetry noted, no dysarthria, moving all four extremities on command. Strength 5/5 symmetric. Cranial nerves intact.   Lab Results: Basic Metabolic Panel:  Recent Labs Lab 04/21/12 1043 04/21/12 1155  04/27/12 1015 04/28/12 0730  NA 136  --   < > 135 138  K 3.7  --   < > 3.6 3.3*  CL 101  --    < > 101 103  CO2  --   --   < > 21 24  GLUCOSE 184*  --   < > 102* 128*  BUN 8  --   < > 9 5*  CREATININE 0.90  --   < > 0.51 0.57  CALCIUM  --   --   < > 8.6 8.6  PHOS  --  2.3  --   --   --   < > = values in this interval not displayed. Liver Function Tests:  Recent Labs Lab 04/23/12 1100 04/25/12 2016  AST 26 38*  ALT 20 27  ALKPHOS 66 71  BILITOT 0.5 0.4  PROT 6.9 7.2  ALBUMIN 3.5 4.1    Recent Labs Lab 04/21/12 1155  AMMONIA 40   CBC:  Recent Labs Lab 04/25/12 2016  04/27/12 0525 04/28/12 0730  WBC 7.6  < > 5.2 4.6  NEUTROABS 5.8  --   --   --   HGB 12.1*  < > 12.4* 12.7*  HCT 35.0*  < > 36.9* 37.3*  MCV 108.4*  < > 109.8* 108.1*  PLT 187  < > 192 219  < > = values in this interval not displayed. Cardiac Enzymes:  Recent Labs Lab 04/21/12 1647 04/27/12 1015  CKTOTAL 103 527*   Thyroid  Function Tests:  Recent Labs Lab 04/22/12 0415  TSH 2.242   Urine Drug Screen: Drugs of Abuse     Component Value Date/Time   LABOPIA NONE DETECTED 04/26/2012 1012   LABOPIA NEGATIVE 12/30/2009 1339   COCAINSCRNUR NONE DETECTED 04/26/2012 1012   COCAINSCRNUR NEGATIVE 12/30/2009 1339   LABBENZ NONE DETECTED 04/26/2012 1012   LABBENZ NEGATIVE 12/30/2009 1339   AMPHETMU NONE DETECTED 04/26/2012 1012   AMPHETMU NEGATIVE 12/30/2009 1339   THCU NONE DETECTED 04/26/2012 1012   LABBARB NONE DETECTED 04/26/2012 1012    Alcohol Level:  Recent Labs Lab 04/25/12 2016  ETH <11   Urinalysis:  Recent Labs Lab 04/22/12 0346  COLORURINE YELLOW  LABSPEC 1.019  PHURINE 7.5  GLUCOSEU NEGATIVE  HGBUR NEGATIVE  BILIRUBINUR NEGATIVE  KETONESUR 15*  PROTEINUR NEGATIVE  UROBILINOGEN 0.2  NITRITE NEGATIVE  LEUKOCYTESUR NEGATIVE     Micro Results: Recent Results (from the past 240 hour(s))  MRSA PCR SCREENING     Status: None   Collection Time    04/21/12  7:31 PM      Result Value Range Status   MRSA by PCR NEGATIVE  NEGATIVE Final   Comment:            The  GeneXpert MRSA Assay (FDA     approved for NASAL specimens     only), is one component of a     comprehensive MRSA colonization     surveillance program. It is not     intended to diagnose MRSA     infection nor to guide or     monitor treatment for     MRSA infections.  MRSA PCR SCREENING     Status: None   Collection Time    04/25/12 11:54 PM      Result Value Range Status   MRSA by PCR NEGATIVE  NEGATIVE Final   Comment:            The GeneXpert MRSA Assay (FDA     approved for NASAL specimens     only), is one component of a     comprehensive MRSA colonization     surveillance program. It is not     intended to diagnose MRSA     infection nor to guide or     monitor treatment for     MRSA infections.   Studies/Results: Dg Chest 2 View  04/27/2012  *RADIOLOGY REPORT*  Clinical Data: Status epilepticus, gap acidosis  CHEST - 2 VIEW  Comparison: Prior chest x-ray 04/21/2012  Findings: Stable appearance of the lungs with bibasilar patchy opacities and mild cardiomegaly.  No new airspace consolidation, pleural effusion or pneumothorax.  Chronic central bronchitic changes are unchanged. No acute osseous abnormality.  IMPRESSION: No significant interval change in the appearance of the chest with chronic bibasilar patchy opacities.   Original Report Authenticated By: Malachy Moan, M.D.    Medications: Reviewed Scheduled Meds: . amLODipine  10 mg Oral Daily  . clopidogrel  75 mg Oral Q breakfast  . enoxaparin (LOVENOX) injection  40 mg Subcutaneous Daily  . folic acid  1 mg Oral Daily  . levETIRAcetam  500 mg Oral BID  . lisinopril  10 mg Oral Daily  . LORazepam  0-4 mg Intravenous Q12H  . multivitamin with minerals  1 tablet Oral Daily  . sodium chloride  3 mL Intravenous Q12H  . thiamine  100 mg Oral Daily   Continuous Infusions: . dextrose 5 % and 0.9% NaCl 125  mL/hr at 04/28/12 0817   PRN Meds:.sodium chloride, hydrALAZINE, LORazepam, LORazepam, sodium  chloride  Assessment/Plan:  62 year old male with history of alcohol/mouthwash abuse, hypertension, previous left thalamic stroke, presents with status epilepticus with left-gaze preference.   Status epilepticus: resolved The patient again presents in status epilepticus, likely representing alcohol withdrawal vs listerine overdose.  Patient given fosphenytoin and Keppra load in the ED as well as IV Ativan.  CT of the head from previous admission did not show any evidence of hemorrhage or infarction, no other intracranial abnormalities or seizure focus, MRI without any acute or subacute infarct. Patient does have hx of stroke as well as toxin ingestion with mouthwash, both may be contributing to seizures. -Appreciate neurology input -Continue Keppra  -continue ativan prn for irritability, continue CIWA protocol  Paranoia - noted during last admission, now with hallucinations.  -consider psych consult to diagnose any underlying psychiatric issue we may be missing  Alcohol abuse - chronic since ~2000 Patient states that he has been drinking about a pint of mouthwash per day for the past month. Potentially harmful ingredients in mouthwash include chlorhexidine gluconate, ethanol, hydrogen peroxide, and methyl salicylate.  Liver enzymes on admission are within normal limits.  -cont CIWA protocol  -cont Thiamine, folic acid, multivitamin  -counseled against using mouthwash and alcohol, offered resources and counseling  Hypertension  The patient has a history of hypertension and medication non-compliance.  His current hypertension may also have some component of agitation/withdrawal. -continue norvasc -increased lisinopril to 10 mg/day -added hydralazine prn for SBP > 180  Thrombocytopenia  Patient has a history of thrombocytopenia. Platelets are 187 on admission, likely from chronic alcohol use.  No active bleeding. -Continue to monitor  Hx of Stroke -cont home Plavix  DVT  Lovenox    Diet Regular  Dispo:  Will ask SW to assist with placement   The patient does not have a current PCP The patient does have transportation limitations that hinder transportation to clinic appointments.     LOS: 3 days   Denton Ar 04/28/2012, 10:36 AM

## 2012-04-28 NOTE — ED Provider Notes (Addendum)
I have supervised the resident on the management of this patient and agree with the note above. I personally interviewed and examined the patient and my addendum is below.   [name] [name] is a 62 y.o. male chronic alcoholic here with seizure. Recently incarcerated and recently admitted for alcohol withdrawal seizure. On arrival, he has L arm and leg shaking, but is awake so likely having partial seizure. I was concerned for partial status epilepticus. He was given ativan and seizure activity improved but still having partial seizures. Neuro was consulted and recommend loading keppra and fosphenytoin for partial status. Patient admitted to step down.   CRITICAL CARE Performed by: Silverio Lay, Mujahid Jalomo   Total critical care time: 30 min  Critical care time was exclusive of separately billable procedures and treating other patients.  Critical care was necessary to treat or prevent imminent or life-threatening deterioration.  Critical care was time spent personally by me on the following activities: development of treatment plan with patient and/or surrogate as well as nursing, discussions with consultants, evaluation of patient's response to treatment, examination of patient, obtaining history from patient or surrogate, ordering and performing treatments and interventions, ordering and review of laboratory studies, ordering and review of radiographic studies, pulse oximetry and re-evaluation of patient's condition.    Richardean Canal, MD 04/28/12 1527  Richardean Canal, MD 04/29/12 [phone]

## 2012-04-28 NOTE — Progress Notes (Signed)
NEURO HOSPITALIST PROGRESS NOTE   SUBJECTIVE:                                                                                                                        No complaints. Currently oriented to place, time and president.   OBJECTIVE:                                                                                                                           Vital signs in last 24 hours: Temp:  [97.1 F (36.2 C)-98.4 F (36.9 C)] 97.1 F (36.2 C) (02/10 0938) Pulse Rate:  [75-96] 88 (02/10 0938) Resp:  [0-20] 18 (02/10 0938) BP: (124-171)/(68-115) 150/99 mmHg (02/10 0938) SpO2:  [100 %] 100 % (02/10 0938)  Intake/Output from previous day: 02/09 0701 - 02/10 0700 In: 2250 [I.V.:2250] Out: 2300 [Urine:2300] Intake/Output this shift:   Nutritional status: General  Past Medical History  Diagnosis Date  . Hypercholesteremia   . Stroke   . Hypertension   . Seizures   . Alcohol abuse    Neurologic Exam:  Mental Status: Alert, oriented, calm with no hallucinations at present time.  Speech fluent without evidence of aphasia.  Able to follow 3 step commands without difficulty. Cranial Nerves: II: Visual fields grossly normal, pupils equal, round, reactive to light and accommodation III,IV, VI: ptosis not present, extra-ocular motions intact bilaterally V,VII: smile symmetric, facial light touch sensation normal bilaterally VIII: hearing normal bilaterally IX,X: gag reflex present XI: bilateral shoulder shrug XII: midline tongue extension Motor: Right : Upper extremity   4/5    Left:     Upper extremity   4/5  Lower extremity   4/5     Lower extremity   4/5 Tone and bulk:normal tone throughout; no atrophy noted Sensory: Pinprick and light touch intact throughout, bilaterally Deep Tendon Reflexes: 2+ bilateral UE,, 1+ bilateral KJ and no AJ Plantars: Right: downgoing   Left: downgoing Cerebellar: normal finger-to-nose,  normal  heel-to-shin test CV: pulses palpable throughout    Lab Results: Lab Results  Component Value Date/Time   CHOL  Value: 152        ATP III CLASSIFICATION:  <200     mg/dL   Desirable  161-096  mg/dL   Borderline High  >=578    mg/dL   High        46/96/2952  5:00 AM   Lipid Panel No results found for this basename: CHOL, TRIG, HDL, CHOLHDL, VLDL, LDLCALC,  in the last 72 hours  Studies/Results: Dg Chest 2 View  04/27/2012  *RADIOLOGY REPORT*  Clinical Data: Status epilepticus, gap acidosis  CHEST - 2 VIEW  Comparison: Prior chest x-ray 04/21/2012  Findings: Stable appearance of the lungs with bibasilar patchy opacities and mild cardiomegaly.  No new airspace consolidation, pleural effusion or pneumothorax.  Chronic central bronchitic changes are unchanged. No acute osseous abnormality.  IMPRESSION: No significant interval change in the appearance of the chest with chronic bibasilar patchy opacities.   Original Report Authenticated By: Malachy Moan, M.D.     MEDICATIONS                                                                                                                        Scheduled: . amLODipine  10 mg Oral Daily  . clopidogrel  75 mg Oral Q breakfast  . enoxaparin (LOVENOX) injection  40 mg Subcutaneous Daily  . folic acid  1 mg Oral Daily  . levETIRAcetam  500 mg Oral BID  . lisinopril  10 mg Oral Daily  . LORazepam  0-4 mg Intravenous Q12H  . multivitamin with minerals  1 tablet Oral Daily  . potassium chloride  40 mEq Oral Once  . sodium chloride  3 mL Intravenous Q12H  . thiamine  100 mg Oral Daily    ASSESSMENT/PLAN:                                                                                                            Doing better today with confusion, seems to wax and wane--likely delirium.  As stated in previous note--symptoms likely related to OD on Listerine and ETOH withdrawal.  Last EEG showed no seizure activity.   Recommend: 1) Continue Keppra  500 mg BID and follow up with neurology as out patient.  2) Cease use of ETOH  No further recommendations or neurodiagnostics recommended.   Neurology will S/O   Assessment and plan discussed with with attending physician and they are in agreement.    Felicie Morn PA-C [facility] [phone]  04/28/2012, 11:05 AM

## 2012-04-28 NOTE — Consult Note (Addendum)
Patient Identification:  [name] [name] Date of Evaluation:  04/28/2012 Reason for Consult: Alcohol, Hallucinations  Referring Provider: Dr. Manson Passey  History of Present Illness:[name] [name] is a 62 year old man with a history of alcohol abuse, thalamic stroke, hyperlipidemia, hypertension, and new onset seizure earlier this week who presents with recurrent seizures after discharge. During the initial hospitalization the seizure workup was unremarkable with a negative CT scan, no acute findings on MRI, and a negative EEG. It was felt he may have alcohol withdrawal seizures given his propensity to drink a whole bottle of mouthwash daily, presumably for the alcohol.  He was brought in via EMS from jail for presumably in status epilepticus.  Prologue:   This pt sways he was in [facility] says he goes almost daily].  He was trying to buy items and could not find his money.  The clerk thought he was trying to steal items.  Police were called and he was taken to jail.  The police found his money.     Past Psychiatric History:Alcohol Dependence, homeless, estranged from family,   Past Medical History:     Past Medical History  Diagnosis Date  . Hypercholesteremia   . Stroke   . Hypertension   . Seizures   . Alcohol abuse       History reviewed. No pertinent past surgical history.  Allergies: No Known Allergies  Current Medications:  Prior to Admission medications   Medication Sig Start Date End Date Taking? Authorizing Provider  amLODipine (NORVASC) 10 MG tablet Take 1 tablet (10 mg total) by mouth daily. 04/23/12  Yes Larey Seat, MD  clopidogrel (PLAVIX) 75 MG tablet Take 75 mg by mouth daily with breakfast. 04/23/12  Yes Larey Seat, MD  levETIRAcetam (KEPPRA) 500 MG tablet Take 1 tablet (500 mg total) by mouth 2 (two) times daily. 04/23/12  Yes Larey Seat, MD  lisinopril (PRINIVIL,ZESTRIL) 5 MG tablet Take 1 tablet (5 mg total) by mouth daily. 04/23/12  Yes Larey Seat, MD   Multiple Vitamin (MULTIVITAMIN WITH MINERALS) TABS Take 1 tablet by mouth daily.   Yes Historical Provider, MD    Social History:    reports that he has been smoking Cigarettes.  He has been smoking about 0.25 packs per day. He does not have any smokeless tobacco history on file. He reports that  drinks alcohol. He reports that he does not use illicit drugs.   Family History:    History reviewed. No pertinent family history.  Mental Status Examination/Evaluation: Objective:  Appearance: Casual and wearing a camaflouge cap in bed, magnifier reading glasses  Eye Contact::  Good  Speech:  Clear and Coherent and Normal Rate  Volume:  Normal  Mood:  depressed  Affect:  Congruent and Depressed  Thought Process:  Disorganized and pt gives coherent story but cannot recall all parts [seizure?]  Orientation:  Other:  Pt is oriented to person, place  Thought Content:  Normal references  Suicidal Thoughts:  No  Homicidal Thoughts:  No  Judgement:  Impaired  Insight:  Lacking   DIAGNOSIS:   AXIS I   Alcohol Dependence, early dementia  AXIS II  Deferred  AXIS III See medical notes.  AXIS IV economic problems, housing problems, other psychosocial or environmental problems, problems related to social environment, problems with primary support group and Apparently family has banished him from contact and he says he has been dismissed from the shelter.  He lives under the [employer]; sleeps on  the ground   AXIS V 41-50 serious symptoms   Assessment/Plan: Dr. Manson Passey [phone] At beginning of session, pt reported seeing spiders all over his food and on his body.  They vanished after receiving Ativan. 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 Pt says he had lived with mother until they were evicted.  She went to live with relatives and he has lived at [facility] five times and [facility] 2008; now lives under the house.  He expresses distrust of trying to live in the  shelters.  He has tried to visit his mom.  He said the family claimed he was making trouble and he was banned from visiting her  His mother died 06-12-11.    He says he has 1 1/2 yurs at [facility]   He has been married twice.  He has a daughter, Victorino Dike from first marriage, who lives in [place].  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  He never joined the [employer].  He begin drinking alcohol at age 14.  He went to [facility] once.   He has had seizures twice about 3 weeks ago.  He began drinking mouthwash because police arrest him with an open bottle of alcohol.  He favors mouth wash: Assured.     He denies auditory or visual hallucinations.  He denies suicidal ideation.  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  It causes him to limp.     He give the date [with hint on the wall].  April 28, 2012.  He interprets proverb with abstract thought.  He spells 'world' and backwards 'dlrow'  He subtracts serial 7s 100, 93,86, 79,72 with reasonable concentration and speed.  He uses functional logic to mail a 'found addressed, stamped' envelope. He is unable to recall 0/3 words after 5 min. RECOMMENDATION:  1.  Pt has capacity but demonstrates memory loss which is more profound when he is trying to explain situations that became complicated for him e.g.  Not finding his money and being taken to jail.  2.  Anticipate his seizures may be related to drinking mouthwash and chronic history of alcohol use. 3.  Suggest pt consider alcohol rehab program and work to resolve homelessness when medically stable.  4.  Will follow pt.  Mickeal Skinner MD 04/28/2012 12:23 PM

## 2012-04-28 NOTE — Progress Notes (Signed)
Assumed care of pt at this time.  Report obtained from [place], [place]

## 2012-04-28 NOTE — Significant Event (Signed)
On 04/27/12 around 2035 a noise was heard from pt room and 2 RN rushed to the room and found pt sitting on the floor with urinal on his side. When asked, pt said he was trying to use the urinal so he stood up from his recliner chair to use and fell. Pt denies hitting his head; no injury sustained. Vitals within limit, physician notified, Charge RN in room and aware of fall, pt family attempted to be reached twice but no answer and no voicemail recorder to leave a message. Prior to fall, RN was in the room with pt who was observed sitting in recliner sleeping with call light within reach. RN left pt room to go get him, his medication when a priority with another pt occurred and RN had to attend to that pt.

## 2012-04-28 NOTE — Progress Notes (Signed)
Clinical Social Work Department BRIEF PSYCHOSOCIAL ASSESSMENT 04/28/2012  Patient:  [name] [name], [name] [name]     Account Number:  [id number]     Admit date:  04/25/2012  Clinical Social Worker:  Robin Searing  Date/Time:  04/28/2012 04:08 PM  Referred by:  Physician  Date Referred:  04/28/2012 Referred for  Homelessness   Other Referral:   Interview type:  Patient Other interview type:    PSYCHOSOCIAL DATA Living Status:  ALONE Admitted from facility:   Level of care:   Primary support name:  "Nadine Counts" a [place] Primary support relationship to patient:  FRIEND Degree of support available:   minimal    CURRENT CONCERNS Current Concerns  Post-Acute Placement   Other Concerns:    SOCIAL WORK ASSESSMENT / PLAN I met with patient today and I am familiar with patient from his recent hospitalization last week-  He has been homeless and has been for over 8 years per his report. He [place] "under the [address]" (a [place] back entrance)- gets food stamps and has a [place] Gay[place] Face friend who looks out for him some as well.    Patient is unclear what all happened prior to this admission except to tell me he was at the [facility] and something happened- he was taken to jail and from there was brought here (?seizures)    I discussed possible placement with him and he may be willing to consider this- I will f/u tomorrow to talk with him further-   Assessment/plan status:  Other - See comment Other assessment/ plan:   FL2 and [place] completion for ?SNF/ALF    Agree that he appears to have some sort of underlying psychiatric issues- Psych consult may be helpful   Information/referral to community resources:   SNF  ALF    PATIENT'S/FAMILY'S RESPONSE TO PLAN OF CARE: Patient appreciaitive of CSW visit and assistance   Reece Levy, MSW, [place] [phone]

## 2012-04-28 NOTE — Evaluation (Signed)
Occupational Therapy Evaluation Patient Details Name: [name] [name] MRN: [id number] DOB: 18-Oct-1950 Today's Date: 04/28/2012 Time: 9629-5284 OT Time Calculation (min): 31 min  OT Assessment / Plan / Recommendation Clinical Impression  Pt admitted with seizures.  Pt with a hx of L thalamic CVA, ETOH, and recent seizure.  Pt is homeless.  Pt presents with decreased balance and generalized weakness impeding mobility and ADL.  Will follow, recommending SNF upon d/c.    OT Assessment  Patient needs continued OT Services    Follow Up Recommendations  SNF    Barriers to Discharge Decreased caregiver support (homeless)    Equipment Recommendations       Recommendations for Other Services    Frequency  Min 2X/week    Precautions / Restrictions Precautions Precautions: Fall Precaution Comments: pt with report of falling alot   Pertinent Vitals/Pain No pain.    ADL  Eating/Feeding: Set up (per sitter) Where Assessed - Eating/Feeding: Bed level Grooming: Wash/dry hands;Min guard Where Assessed - Grooming: Supported standing Upper Body Bathing: Minimal assistance Where Assessed - Upper Body Bathing: Unsupported sitting Lower Body Bathing: Moderate assistance Where Assessed - Lower Body Bathing: Unsupported sitting;Supported sit to stand Upper Body Dressing: Minimal assistance Where Assessed - Upper Body Dressing: Unsupported sitting Lower Body Dressing: Moderate assistance Where Assessed - Lower Body Dressing: Unsupported sitting;Supported sit to stand Toilet Transfer: Moderate assistance Toilet Transfer Method: Sit to stand Toilet Transfer Equipment: Comfort height toilet;Grab bars Toileting - Clothing Manipulation and Hygiene: Maximal assistance Where Assessed - Toileting Clothing Manipulation and Hygiene: Sit to stand from 3-in-1 or toilet Equipment Used: Gait belt Transfers/Ambulation Related to ADLs: ambulated with +2 hand held assist, pt 80 %    OT Diagnosis:  Generalized weakness  OT Problem List: Decreased strength;Decreased activity tolerance;Impaired balance (sitting and/or standing);Decreased coordination OT Treatment Interventions: Self-care/ADL training;Therapeutic activities;Balance training   OT Goals Acute Rehab OT Goals OT Goal Formulation: With patient Time For Goal Achievement: 05/12/12 Potential to Achieve Goals: Good ADL Goals Pt Will Perform Grooming: with modified independence;Standing at sink ADL Goal: Grooming - Progress: Goal set today Pt Will Perform Upper Body Bathing: with modified independence;Standing at sink ADL Goal: Upper Body Bathing - Progress: Goal set today Pt Will Perform Lower Body Bathing: with modified independence;Standing at sink ADL Goal: Lower Body Bathing - Progress: Goal set today Pt Will Perform Upper Body Dressing: with modified independence;Sitting, bed ADL Goal: Upper Body Dressing - Progress: Goal set today Pt Will Perform Lower Body Dressing: with modified independence;Sit to stand from bed ADL Goal: Lower Body Dressing - Progress: Goal set today Pt Will Transfer to Toilet: with modified independence;Ambulation;with DME ADL Goal: Toilet Transfer - Progress: Goal set today Pt Will Perform Toileting - Clothing Manipulation: with modified independence;Standing ADL Goal: Toileting - Clothing Manipulation - Progress: Goal set today Pt Will Perform Toileting - Hygiene: Independently;Sit to stand from 3-in-1/toilet ADL Goal: Toileting - Hygiene - Progress: Goal set today  Visit Information  Last OT Received On: 04/28/12 Assistance Needed: +2 PT/OT Co-Evaluation/Treatment: Yes    Subjective Data  Subjective: "I wash up at [facility] or [employer]   Prior Functioning     Home Living Type of Home: Homeless Communication Communication: No difficulties Dominant Hand: Right         Vision/Perception Vision - History Baseline Vision: Wears glasses all the time Patient Visual  Report: No change from baseline   Cognition  Cognition Overall Cognitive Status: Appears within functional limits for tasks assessed/performed Arousal/Alertness: Awake/alert  Orientation Level: Oriented X4 / Intact Behavior During Session: WFL for tasks performed Cognition - Other Comments: pt appropriate during session however sitter reports pt to have had hallucinations and was "hearing voices" earlier this morning    Extremity/Trunk Assessment Right Upper Extremity Assessment RUE ROM/Strength/Tone: WFL for tasks assessed RUE Coordination: Deficits RUE Coordination Deficits: hx of L thalamic CVA Left Upper Extremity Assessment LUE ROM/Strength/Tone: WFL for tasks assessed LUE Sensation: WFL - Light Touch LUE Coordination: WFL - gross/fine motor Trunk Assessment Trunk Assessment: Normal     Mobility Bed Mobility Bed Mobility: Supine to Sit Supine to Sit: 4: Min guard;HOB elevated;With rails Transfers Transfers: Sit to Stand;Stand to Sit Sit to Stand: 4: Min assist;With upper extremity assist;From bed;From toilet Stand to Sit: 4: Min assist;With upper extremity assist;To toilet;To chair/3-in-1 Details for Transfer Assistance:  (sits prior to aligning body with chair)     Exercise     Balance Balance Balance Assessed: Yes Static Sitting Balance Static Sitting - Balance Support: No upper extremity supported Static Sitting - Level of Assistance: 5: Stand by assistance   End of Session OT - End of Session Equipment Utilized During Treatment: Gait belt Activity Tolerance: Patient limited by fatigue Patient left: in chair;with call bell/phone within reach Psychiatrist) Nurse Communication: Mobility status  GO     Evern Bio 04/28/2012, 1:28 PM

## 2012-04-28 NOTE — Evaluation (Signed)
Physical Therapy Evaluation Patient Details Name: [name] [name] MRN: [id number] DOB: [birth year] Today's Date: 04/28/2012 Time: 4098-1191 PT Time Calculation (min): 32 min  PT Assessment / Plan / Recommendation Clinical Impression  Pt 62 yo male with apparent cognitive deficits due to h/o of drinking a bottle of listerine daily as well as moblity impairments. Pt at a significant increased fall risk and admits to falling a lot recently. Pt to strongly benefit from SNF placement upon d/c due to pt inabiltiy to care for self and increased fall risk. SNF to address mentioned deficits to improve safety of mobility.    PT Assessment  Patient needs continued PT services    Follow Up Recommendations  SNF;Supervision/Assistance - 24 hour    Does the patient have the potential to tolerate intense rehabilitation      Barriers to Discharge  (pt is homeless)      Equipment Recommendations  Rolling walker with 5" wheels    Recommendations for Other Services     Frequency Min 3X/week    Precautions / Restrictions Precautions Precautions: Fall Precaution Comments: pt with report of falling alot Restrictions Weight Bearing Restrictions: No   Pertinent Vitals/Pain Denies pain      Mobility  Bed Mobility Bed Mobility: Supine to Sit Supine to Sit: 4: Min guard;HOB elevated;With rails Transfers Transfers: Sit to Stand;Stand to Sit Sit to Stand: 4: Min assist;With upper extremity assist;From bed Stand to Sit: 4: Min assist;With upper extremity assist;To chair/3-in-1 Details for Transfer Assistance: increased time, mild shakiness Ambulation/Gait Ambulation/Gait Assistance: 1: +2 Total assist (for safety) Ambulation/Gait: Patient Percentage: 80% Ambulation Distance (Feet): 40 Feet Assistive device: 2 person hand held assist Ambulation/Gait Assistance Details: pt very shaky, + LOB during the 2 turns, short shuffled steps, wide base of support. pt at significant fall risk. Pt with  increased bilat UE WBing, may benefit from walker ( will trial next session.) Gait Pattern: Step-through pattern;Decreased stride length;Wide base of support Gait velocity: slow General Gait Details: unable to complete turn without LOB - requiring maxA to regain Stairs: No    Exercises     PT Diagnosis: Difficulty walking  PT Problem List: Decreased balance;Decreased activity tolerance;Decreased cognition;Decreased coordination PT Treatment Interventions: DME instruction;Gait training;Stair training;Functional mobility training;Therapeutic activities;Therapeutic exercise;Balance training   PT Goals Acute Rehab PT Goals PT Goal Formulation: With patient Time For Goal Achievement: 05/12/12 Potential to Achieve Goals: Good Pt will go Supine/Side to Sit: Independently;with HOB 0 degrees PT Goal: Supine/Side to Sit - Progress: Goal set today Pt will go Sit to Supine/Side: Independently;with HOB 0 degrees PT Goal: Sit to Supine/Side - Progress: Goal set today Pt will go Sit to Stand: with supervision;with upper extremity assist PT Goal: Sit to Stand - Progress: Goal set today Pt will Ambulate: 51 - 150 feet;with rolling walker;with mod assist PT Goal: Ambulate - Progress: Goal set today  Visit Information  Last PT Received On: 04/28/12 Assistance Needed: +2 PT/OT Co-Evaluation/Treatment: Yes    Subjective Data  Subjective: Pt received sitting up in bed drinking coffee.Pt pleasant and agreeable to PT.   Prior Functioning  Home Living Lives With:  (homeless) Available Help at Discharge:  (homeless) Type of Home: Homeless Additional Comments: pt showers at [employer] or [employer], pt on food stamps Prior Function Level of Independence: Independent Vocation:  (will retire in august when he turns 78) Communication Communication: No difficulties Dominant Hand: Right    Cognition  Cognition Overall Cognitive Status: Appears within functional limits for tasks  assessed/performed Arousal/Alertness:  Awake/alert Orientation Level: Oriented X4 / Intact Behavior During Session: WFL for tasks performed Cognition - Other Comments: pt appropriate during session however sitter reports pt to have had hallucinations and was "hearing voices" earlier this morning    Extremity/Trunk Assessment Right Upper Extremity Assessment RUE ROM/Strength/Tone: [facility] for tasks assessed (mild deficit from previous stroke in 2010) RUE Sensation:  (mild numbness from stroke) RUE Coordination:  (mild impairment but still functional) Left Upper Extremity Assessment LUE ROM/Strength/Tone: WFL for tasks assessed LUE Sensation: WFL - Light Touch LUE Coordination: WFL - gross/fine motor Right Lower Extremity Assessment RLE ROM/Strength/Tone: WFL for tasks assessed RLE Sensation:  (numbness from previous stroke) RLE Coordination:  (mild impairement from previous stroke) Left Lower Extremity Assessment LLE ROM/Strength/Tone: Within functional levels LLE Sensation: WFL - Light Touch LLE Coordination: WFL - gross/fine motor Trunk Assessment Trunk Assessment: Normal   Balance Balance Balance Assessed: Yes Static Sitting Balance Static Sitting - Balance Support: No upper extremity supported Static Sitting - Level of Assistance: 5: Stand by assistance Static Sitting - Comment/# of Minutes: 5 Static Standing Balance Static Standing - Balance Support: Bilateral upper extremity supported Static Standing - Level of Assistance: 3: Mod assist Static Standing - Comment/# of Minutes: 2 Dynamic Standing Balance Dynamic Standing - Balance Support: Bilateral upper extremity supported Dynamic Standing - Level of Assistance: 2: Max assist Dynamic Standing - Balance Activities:  (washing of hands and ambulation)  End of Session PT - End of Session Equipment Utilized During Treatment: Gait belt Activity Tolerance: Patient tolerated treatment well Patient left: in chair;with call bell/phone  within reach Psychiatrist present) Nurse Communication: Mobility status  GP     Marcene Brawn 04/28/2012, 10:35 AM  Lewis Shock, PT, DPT Pager #: [phone] Office #: [phone]

## 2012-04-29 DIAGNOSIS — H5316 Psychophysical visual disturbances: Secondary | ICD-10-CM

## 2012-04-29 LAB — BASIC METABOLIC PANEL
BUN: 6 mg/dL (ref 6–23)
CO2: 25 mEq/L (ref 19–32)
Calcium: 8.5 mg/dL (ref 8.4–10.5)
Creatinine, Ser: 0.6 mg/dL (ref 0.50–1.35)
Glucose, Bld: 105 mg/dL — ABNORMAL HIGH (ref 70–99)

## 2012-04-29 MED ORDER — LISINOPRIL 20 MG PO TABS: 20.0000 mg | ORAL_TABLET | Freq: Every day | ORAL | Status: DC

## 2012-04-29 MED ORDER — LISINOPRIL 5 MG PO TABS: 20.0000 mg | ORAL_TABLET | Freq: Every day | ORAL | Status: DC

## 2012-04-29 NOTE — Clinical Social Work Note (Signed)
Clinical Social Work   Pt is ready for discharge. Pt would like to return to basement [facility] the [facility], where he has been staying for the past 3 years. Pt's belongings are located there. CSW arranged transportation to the [facility]. Pt did not express any further needs. CSW is signing off, as pt is for discharge and no further needs identified.   Dede Query, MSW, Theresia Majors [phone]

## 2012-04-29 NOTE — Progress Notes (Signed)
Internal Medicine Attending  Date: 04/29/2012  Patient name: [name] Medical record number: [id number] Date of birth: 24-Dec-1950 Age: 62 y.o. Gender: male  I saw and evaluated the patient. I reviewed the resident's note by Dr. Collier Bullock and I agree with the resident's findings and plans as documented in her progress note.  Mr. Stgermaine has had no overt seizure activity since admission. He has had both visual and tactile hallucinations. The visual hallucinations would be consistent with alcohol abuse. The tactile hallucinations could be consistent with either alcohol abuse or seizures. He had no more sensations of spiders crawling on his skin and no more visual hallucinations of spiders crawling in the room. He did have some numbness on the left side of the body that he reported today. An extensive neurologic exam demonstrated mild decreased sensation to pinprick on the left compared to the right in the arm and the leg. Although he was reporting numbness in the left face there was no difference in sensation between the left and the right on examination. His strength was 5/5 throughout and his reflexes were 2+ throughout. His cerebellar exam was intact. He did admit to some residual left-sided weakness from his previous stroke and it is possible that this mild decrease in sensation was also residual from his previous stroke. At this point, given our exam, my belief is that the case of these symptoms are residual deficits rather than him having developed a brand-new stroke this morning.  Clinical social work on the psychiatry service line evaluated Mr. Mcglory this morning. She was told by Mr. Challis that he was not interested in any rehabilitation for his alcohol abuse. He was not felt to be a candidate for behavioral health either given the resolution of his visual and tactile hallucinations. He understood that he would be discharged from the hospital and knew that winter weather was coming but he felt  he would be fine in the basement of the [facility]. As we have nothing further to offer Mr. Gritton at this time we will discharge him on the Keppra as outlined by the neurology service. He may be able to followup back with [facility] once the new clinic opens for the homeless. This is projected to be in the very near future.

## 2012-04-29 NOTE — Progress Notes (Addendum)
Pt. C/o new onset tingling of entire left arm and left face. No other neurologic symptoms seen. Resident, Intern and Attending on the unit doing rounds, notified of pt.'s symptoms, will see patient.

## 2012-04-29 NOTE — Progress Notes (Signed)
Subjective:  Perseverating over finding the keys to his mother's safety deposit box this morning.  Patient also complaining of numbness and tingling along his left face and body that started this morning. He states this is new. No associated symptoms. Denies auditory or visual hallucinations at this time. Has not seen any spiders on his food or sheets recently.  Objective: Vital signs in last 24 hours: Filed Vitals:   04/28/12 1845 04/28/12 2059 04/29/12 0532 04/29/12 0732  BP: 130/105 145/90 151/97 146/100  Pulse: 87 92 83 80  Temp: 98.5 F (36.9 C) 98.3 F (36.8 C) 97.6 F (36.4 C) 97.9 F (36.6 C)  TempSrc: Oral Oral Oral Oral  Resp: 18 20 20 18   Height:      Weight:      SpO2: 100% 99% 99% 99%   Weight change:   Intake/Output Summary (Last 24 hours) at 04/29/12 0852 Last data filed at 04/29/12 0528  Gross per 24 hour  Intake   2968 ml  Output   4400 ml  Net  -1432 ml    Physical Exam Blood pressure 146/100, pulse 80, temperature 97.9 F (36.6 C), temperature source Oral, resp. rate 18, height 5\' 7"  (1.702 m), weight 156 lb 12 oz (71.1 kg), SpO2 99.00%. General: NAD HEENT:  PERRL, 4mm, EOM intact, moist mucous membranes Cardiovascular:  Regular rate and rhythm, no murmurs Respiratory:  Clear to auscultation bilaterally, no wheezes, rales, or rhonchi Abdomen:  Soft, nondistended, nontender, bowel sounds present Extremities:  Warm and well-perfused, no edema.  Skin: Warm, dry, no rashes Neuro: oriented to person and hospital, will follow commands, no facial asymmetry noted, no dysarthria, moving all four extremities on command. Strength 5/5 symmetric. Cranial nerves intact. No asterixis, no clear pronator drift. Did report some decreased sensation to pinprick on the left arm and left leg. Otherwise normal exam.   Lab Results: Basic Metabolic Panel:  Recent Labs Lab 04/28/12 0730 04/29/12 0642  NA 138 140  K 3.3* 3.7  CL 103 106  CO2 24 25  GLUCOSE 128* 105*   BUN 5* 6  CREATININE 0.57 0.60  CALCIUM 8.6 8.5   Liver Function Tests:  Recent Labs Lab 04/23/12 1100 04/25/12 2016  AST 26 38*  ALT 20 27  ALKPHOS 66 71  BILITOT 0.5 0.4  PROT 6.9 7.2  ALBUMIN 3.5 4.1   No results found for this basename: AMMONIA,  in the last 168 hours CBC:  Recent Labs Lab 04/25/12 2016  04/27/12 0525 04/28/12 0730  WBC 7.6  < > 5.2 4.6  NEUTROABS 5.8  --   --   --   HGB 12.1*  < > 12.4* 12.7*  HCT 35.0*  < > 36.9* 37.3*  MCV 108.4*  < > 109.8* 108.1*  PLT 187  < > 192 219  < > = values in this interval not displayed. Cardiac Enzymes:  Recent Labs Lab 04/27/12 1015  CKTOTAL 527*   Thyroid Function Tests: No results found for this basename: TSH, T4TOTAL, FREET4, T3FREE, THYROIDAB,  in the last 168 hours Urine Drug Screen: Drugs of Abuse     Component Value Date/Time   LABOPIA NONE DETECTED 04/26/2012 1012   LABOPIA NEGATIVE 12/30/2009 1339   COCAINSCRNUR NONE DETECTED 04/26/2012 1012   COCAINSCRNUR NEGATIVE 12/30/2009 1339   LABBENZ NONE DETECTED 04/26/2012 1012   LABBENZ NEGATIVE 12/30/2009 1339   AMPHETMU NONE DETECTED 04/26/2012 1012   AMPHETMU NEGATIVE 12/30/2009 1339   THCU NONE DETECTED 04/26/2012 1012   LABBARB  NONE DETECTED 04/26/2012 1012    Alcohol Level:  Recent Labs Lab 04/25/12 2016  ETH <11     Micro Results: Recent Results (from the past 240 hour(s))  MRSA PCR SCREENING     Status: None   Collection Time    04/21/12  7:31 PM      Result Value Range Status   MRSA by PCR NEGATIVE  NEGATIVE Final   Comment:            The GeneXpert MRSA Assay (FDA     approved for NASAL specimens     only), is one component of a     comprehensive MRSA colonization     surveillance program. It is not     intended to diagnose MRSA     infection nor to guide or     monitor treatment for     MRSA infections.  MRSA PCR SCREENING     Status: None   Collection Time    04/25/12 11:54 PM      Result Value Range Status   MRSA by PCR  NEGATIVE  NEGATIVE Final   Comment:            The GeneXpert MRSA Assay (FDA     approved for NASAL specimens     only), is one component of a     comprehensive MRSA colonization     surveillance program. It is not     intended to diagnose MRSA     infection nor to guide or     monitor treatment for     MRSA infections.   Studies/Results: No results found. Medications: Reviewed Scheduled Meds: . amLODipine  10 mg Oral Daily  . clopidogrel  75 mg Oral Q breakfast  . enoxaparin (LOVENOX) injection  40 mg Subcutaneous Daily  . folic acid  1 mg Oral Daily  . levETIRAcetam  500 mg Oral BID  . lisinopril  10 mg Oral Daily  . LORazepam  0-4 mg Intravenous Q12H  . multivitamin with minerals  1 tablet Oral Daily  . sodium chloride  3 mL Intravenous Q12H  . thiamine  100 mg Oral Daily   Continuous Infusions: . dextrose 5 % and 0.9% NaCl 125 mL/hr at 04/29/12 0832   PRN Meds:.sodium chloride, hydrALAZINE, polyvinyl alcohol, sodium chloride  Assessment/Plan:  62 year old male with history of alcohol/mouthwash abuse, hypertension, previous left thalamic stroke, presents with status epilepticus with left-gaze preference.   Status epilepticus: resolved The patient again presents in status epilepticus, likely representing alcohol withdrawal vs listerine overdose.  Patient given fosphenytoin and Keppra load in the ED as well as IV Ativan.  CT of the head from previous admission did not show any evidence of hemorrhage or infarction, no other intracranial abnormalities or seizure focus, MRI without any acute or subacute infarct. Patient does have hx of stroke as well as toxin ingestion with mouthwash, both may be contributing to seizures. -Continue Keppra  -continue ativan prn for irritability, continue CIWA protocol  Paranoia/Hallucinations - noted during last admission, now with hallucinations of spiders crawling in his food. Unclear whether this is from chronic ETOH/mouthwash use or  withdrawal vs. Undiagnosed underlying psychiatric disorder. Today he has new complaints of L-sided numbness and tingling and an otherwise normal neuro exam. -appreciated psychiatry recommendations -pt agreeable to rehab program at this time  Alcohol abuse - chronic since ~2000 Patient states that he has been drinking about a pint of mouthwash per day, looking at the chart, likely for  about 6 years. Potentially harmful ingredients in mouthwash include chlorhexidine gluconate, ethanol, hydrogen peroxide, and methyl salicylate. Seizures may have been from toxicity vs etoh withdrawal as ETOH level on admission was <11. Patient drinks Assured mouthwash which he gets from the [employer] for $1 which has 21.6% alcohol content  -cont CIWA protocol  -cont Thiamine, folic acid, multivitamin  -counseled against using mouthwash and alcohol, offered resources and counseling -alcohol rehab program as above  Hypertension  The patient has a history of hypertension and medication non-compliance.  His current hypertension may also have some component of agitation/withdrawal. -continue norvasc -increase lisinopril to 20 mg/day -added hydralazine prn for SBP > 180  Thrombocytopenia  Patient has a history of thrombocytopenia. Platelets are 187 on admission, likely from chronic alcohol use.  No active bleeding. -Continue to monitor  Hx of Stroke -cont home Plavix  DVT  Lovenox   Diet Regular  Dispo:  Psych/SW to assist with placement in alcohol rehab facility   The patient does not have a current PCP The patient does have transportation limitations that hinder transportation to clinic appointments.     LOS: 4 days   [place] Ar 04/29/2012, 8:52 AM

## 2012-04-29 NOTE — Progress Notes (Signed)
Nutrition Brief Note  Patient identified on the Malnutrition Screening Tool (MST) Report  Pt denies any recent weight loss. Pt is homeless and eats food from the [employer]. Pt drinks mouthwash daily and is not willing to make any changes.   Body mass index is 24.54 kg/(m^2). BMI WNL.  Current diet order is Regular, patient is consuming approximately 100% of meals at this time. Labs and medications reviewed.   No nutrition interventions warranted at this time. If nutrition issues arise, please consult RD.   Kendell Bane RD, LDN, CNSC [phone] Pager [phone] After Hours Pager

## 2012-04-29 NOTE — Progress Notes (Signed)
Clinical Social Work Department CLINICAL SOCIAL WORK PSYCHIATRY SERVICE LINE ASSESSMENT 04/29/2012  Patient:  [name] [name]  Account:  [id number]  Admit Date:  04/25/2012  Clinical Social Worker:  Unk Lightning, LCSW  Date/Time:  04/29/2012 10:00 AM Referred by:  Physician  Date referred:  04/29/2012 Reason for Referral  Behavioral Health Issues   Presenting Symptoms/Problems (In the person's/family's own words):   Psych consulted due to patient experiencing hallucinations.   Abuse/Neglect/Trauma History (check all that apply)  Denies history   Abuse/Neglect/Trauma Comments:   Psychiatric History (check all that apply)  Denies history   Psychiatric medications:  None   Current Mental Health Hospitalizations/Previous Mental Health History:   Patient denies all MH history   Current provider:   N/A   Place and Date:   N/A   Current Medications:   sodium chloride, hydrALAZINE, polyvinyl alcohol, sodium chloride            . amLODipine  10 mg Oral Daily  . clopidogrel  75 mg Oral Q breakfast  . enoxaparin (LOVENOX) injection  40 mg Subcutaneous Daily  . folic acid  1 mg Oral Daily  . levETIRAcetam  500 mg Oral BID  . lisinopril  10 mg Oral Daily  . LORazepam  0-4 mg Intravenous Q12H  . multivitamin with minerals  1 tablet Oral Daily  . sodium chloride  3 mL Intravenous Q12H  . thiamine  100 mg Oral Daily   Previous Impatient Admission/Date/Reason:   None reported   Emotional Health / Current Symptoms    Suicide/Self Harm  None reported   Suicide attempt in the past:   Patient denies any attempts in the past. Patient denies any current SI or HI.   Other harmful behavior:   Psychotic/Dissociative Symptoms  Visual Hallucinations   Other Psychotic/Dissociative Symptoms:   Patient reports that he had a nightmare that "everything was crazy and I was seeing things that weren't true." CSW verified if patient was asleep or awake during this time and patient reports  he was sleeping. CSW explained that he was not hallucinating but rather dreaming. Patient then reports that he had hallucinations when he was awake too but they "went away after the [place] Marshal came to visit." Patient reports he was seeing spiders yesterday but denies any current visual or auditory hallucinations.    Attention/Behavioral Symptoms  Inattentive   Other Attention / Behavioral Symptoms:    Cognitive Impairment  Within Normal Limits   Other Cognitive Impairment:    Mood and Adjustment  Mood Congruent    Stress, Anxiety, Trauma, Any Recent Loss/Stressor  Other - See comment   Anxiety (frequency):   Phobia (specify):   Compulsive behavior (specify):   Obsessive behavior (specify):   Other:   Patient is homeless.   Substance Abuse/Use  Current substance use   SBIRT completed (please refer for detailed history):  Y  Self-reported substance use:   Patient reports he drinks a pint of Listerine mouthwash daily. Patient reports he used to drink beer but reports that he got several tickets from [employer] for open container on the street so he drinks mouthwash because it is not illegal.   Urinary Drug Screen Completed:  N Alcohol level:   <11    Environmental/Housing/Living Arrangement  HOMELESS   Who is in the home:   Patient reports another man lives in the basement at [facility] with him.   Emergency contact:  Patient reports he does not need anyone list but reports that [place] [address]  is a good friend.   Financial  IPRS   Patient's Strengths and Goals (patient's own words):   Patient is resourceful and knowledgeable of community resources.   Clinical Social Worker's Interpretive Summary:   CSW received referral to complete psychosocial assessment. CSW reviewed chart and spoke with bedside RN. CSW met with patient at bedside. No visitors present.    CSW introduced myself and explained role. Patient reports that he is unsure why he was admitted to the hospital or why he  was at the jail. Patient reports that he lived with his mom for 48 years before sisters had mom placed at a nursing home. Patient reports he was banned from visiting mom due to not being allowed on nursing home campus. Patient reports that mom passed away last year and he has felt depressed since her death.    Patient reports no contact with siblings. Patient has a dtr who lives 90 miles away and 3 grandchildren. Patient reports it is not an option to live with friends or family because he would be a burden. Patient reports that he has thousands of dollars in [employer] estate but cannot get money until he is legally appointed over [employer]. Patient reports that he has stayed at shelters in the past but that he refuses to return to [facility] or any emergency shelter. Patient refuses to leave the county and is not agreeable to shelters in other counties.    Patient denies any MH diagnosis. Patient does report that he feels depressed at times but has never been formally diagnosed. CSW and patient discussed substance use and SBIRT completed. Patient reports that he drinks mouthwash in order to sleep better at night. Patient reports it is noisy on the streets and cold but the mouthwash helps him relax and sleep better. Patient denies that he has an alcohol problem and reports that he does not want any resources or counseling to assist with reducing or eliminating his use. CSW spoke with patient regarding medication management and taking sleeping pills to assist with sleeping instead of mouthwash. Patient reports that sleeping pills "are bad for you" and refuses any referrals. CSW reminded patient that mouthwash has negative side effects and inquired if patient had been counseled to stop drinking mouthwash and alcohol. Patient reports that MD has explained harmful affects but he does not have any desire to quit drinking.    Patient engaged in assessment but is not interested in creating a plan to eliminate  stressors. Patient is aware of shelters but refuses placement although he acknowledges that it is supposed to snow this week and it will be cold. Patient admits to depression and grief over mother's death but refuses any therapy or support groups. Patient admits to drinking alcohol and mouthwash but does not feel it is an issue and refuses any treatment. Patient reports he can return back to basement and will "be just fine there." Patient is not a harm to himself or others at this time and psych MD feels patient has capacity to make his own decisions. Patient has been offered resources but refusing any outpatient follow up. Patient denies any SI or HI or psychotic features at this time and does not meet criteria for inpatient psychiatric placement.    Psych MD recommended alcohol rehab but patient denies. CSW will staff case with psych MD and follow any further recommendations.   Disposition:  Recommend Psych CSW continuing to support while in hospital

## 2012-04-30 ENCOUNTER — Encounter (HOSPITAL_COMMUNITY): Payer: Self-pay | Admitting: *Deleted

## 2012-04-30 ENCOUNTER — Emergency Department (HOSPITAL_COMMUNITY)
Admission: EM | Admit: 2012-04-30 | Discharge: 2012-05-01 | Disposition: A | Discharge status: Home / Self Care | Payer: Self-pay | Attending: Emergency Medicine | Admitting: Emergency Medicine

## 2012-04-30 DIAGNOSIS — Z79899 Other long term (current) drug therapy: Secondary | ICD-10-CM | POA: Insufficient documentation

## 2012-04-30 DIAGNOSIS — I10 Essential (primary) hypertension: Secondary | ICD-10-CM | POA: Insufficient documentation

## 2012-04-30 DIAGNOSIS — Z5900 Homelessness unspecified: Secondary | ICD-10-CM | POA: Insufficient documentation

## 2012-04-30 DIAGNOSIS — G589 Mononeuropathy, unspecified: Secondary | ICD-10-CM | POA: Insufficient documentation

## 2012-04-30 DIAGNOSIS — G40909 Epilepsy, unspecified, not intractable, without status epilepticus: Secondary | ICD-10-CM | POA: Insufficient documentation

## 2012-04-30 DIAGNOSIS — E7800 Pure hypercholesterolemia, unspecified: Secondary | ICD-10-CM | POA: Insufficient documentation

## 2012-04-30 DIAGNOSIS — G629 Polyneuropathy, unspecified: Secondary | ICD-10-CM

## 2012-04-30 DIAGNOSIS — Z8673 Personal history of transient ischemic attack (TIA), and cerebral infarction without residual deficits: Secondary | ICD-10-CM | POA: Insufficient documentation

## 2012-04-30 DIAGNOSIS — F1010 Alcohol abuse, uncomplicated: Secondary | ICD-10-CM | POA: Insufficient documentation

## 2012-04-30 DIAGNOSIS — IMO0002 Reserved for concepts with insufficient information to code with codable children: Secondary | ICD-10-CM | POA: Insufficient documentation

## 2012-04-30 DIAGNOSIS — F17200 Nicotine dependence, unspecified, uncomplicated: Secondary | ICD-10-CM | POA: Insufficient documentation

## 2012-04-30 LAB — COMPREHENSIVE METABOLIC PANEL
ALT: 26 U/L (ref 0–53)
Albumin: 3.8 g/dL (ref 3.5–5.2)
Alkaline Phosphatase: 73 U/L (ref 39–117)
Chloride: 104 mEq/L (ref 96–112)
Glucose, Bld: 88 mg/dL (ref 70–99)
Potassium: 3.4 mEq/L — ABNORMAL LOW (ref 3.5–5.1)
Sodium: 144 mEq/L (ref 135–145)
Total Bilirubin: 0.1 mg/dL — ABNORMAL LOW (ref 0.3–1.2)
Total Protein: 7.4 g/dL (ref 6.0–8.3)

## 2012-04-30 LAB — CBC WITH DIFFERENTIAL/PLATELET
Basophils Relative: 0 % (ref 0–1)
Eosinophils Absolute: 0.1 10*3/uL (ref 0.0–0.7)
Lymphs Abs: 1.4 10*3/uL (ref 0.7–4.0)
MCH: 37.4 pg — ABNORMAL HIGH (ref 26.0–34.0)
Neutro Abs: 5.1 10*3/uL (ref 1.7–7.7)
Neutrophils Relative %: 69 % (ref 43–77)
Platelets: 331 10*3/uL (ref 150–400)
RBC: 3.4 MIL/uL — ABNORMAL LOW (ref 4.22–5.81)

## 2012-04-30 MED ORDER — SODIUM CHLORIDE 0.9 % IV BOLUS (SEPSIS)
1000.0000 mL | Freq: Once | INTRAVENOUS | Status: AC
  Administered 2012-04-30: 1000 mL via INTRAVENOUS

## 2012-04-30 MED ORDER — VITAMIN B-1 100 MG PO TABS
100.0000 mg | ORAL_TABLET | Freq: Once | ORAL | Status: AC
  Administered 2012-04-30: 100 mg via ORAL
  Filled 2012-04-30: qty 1

## 2012-04-30 MED ORDER — LORAZEPAM 1 MG PO TABS
1.0000 mg | ORAL_TABLET | Freq: Once | ORAL | Status: AC
  Administered 2012-05-01: 1 mg via ORAL
  Filled 2012-04-30: qty 1

## 2012-04-30 MED ORDER — LORAZEPAM 2 MG/ML IJ SOLN
1.0000 mg | Freq: Once | INTRAMUSCULAR | Status: AC
  Administered 2012-04-30: 1 mg via INTRAVENOUS
  Filled 2012-04-30: qty 1

## 2012-04-30 NOTE — ED Provider Notes (Signed)
History     CSN: [id number]  Arrival date & time 04/30/12  7829   First MD Initiated Contact with Patient 04/30/12 1930      Chief Complaint  Patient presents with  . Numbness    (Consider location/radiation/quality/duration/timing/severity/associated sxs/prior treatment) Patient is a 62 y.o. male presenting with neurologic complaint. The history is provided by the patient.  Neurologic Problem The primary symptoms include paresthesias. Primary symptoms do not include headaches, fever, nausea or vomiting. The symptoms began 2 to 6 hours ago. The symptoms are unchanged. The neurological symptoms are focal. Context: spontaneous.  Paresthesias began 3 - 6 hours ago. The paresthesias are unchanged. The paresthesias are described as burning and tingling. Affected locations include the: left upper arm and left forearm.  Additional symptoms do not include weakness, loss of balance, photophobia, vertigo or anxiety. Medical issues also include seizures. Associated medical issues comments: alcohol abuse. Workup history includes CT scan and EEG.    Past Medical History  Diagnosis Date  . Hypercholesteremia   . Stroke   . Hypertension   . Seizures   . Alcohol abuse     No past surgical history on file.  No family history on file.  History  Substance Use Topics  . Smoking status: Current Every Day Smoker -- 0.25 packs/day    Types: Cigarettes  . Smokeless tobacco: Not on file  . Alcohol Use: Yes     Comment: drinks bottles of 1pint  mouthwash daily      Review of Systems  Constitutional: Negative for fever and fatigue.  HENT: Negative for congestion, rhinorrhea and postnasal drip.   Eyes: Negative for photophobia and visual disturbance.  Respiratory: Negative for chest tightness, shortness of breath and wheezing.   Cardiovascular: Negative for chest pain, palpitations and leg swelling.  Gastrointestinal: Negative for nausea, vomiting, abdominal pain and diarrhea.   Genitourinary: Negative for urgency, frequency and difficulty urinating.  Musculoskeletal: Negative for back pain and arthralgias.  Skin: Negative for rash and wound.  Neurological: Positive for numbness and paresthesias. Negative for vertigo, weakness, headaches and loss of balance.  Psychiatric/Behavioral: Negative for confusion and agitation.    Allergies  Review of patient's allergies indicates no known allergies.  Home Medications   Current Outpatient Rx  Name  Route  Sig  Dispense  Refill  . amLODipine (NORVASC) 10 MG tablet   Oral   Take 1 tablet (10 mg total) by mouth daily.   30 tablet   4   . clopidogrel (PLAVIX) 75 MG tablet   Oral   Take 75 mg by mouth daily with breakfast.         . levETIRAcetam (KEPPRA) 500 MG tablet   Oral   Take 1 tablet (500 mg total) by mouth 2 (two) times daily.   60 tablet   0   . lisinopril (PRINIVIL,ZESTRIL) 5 MG tablet   Oral   Take 4 tablets (20 mg total) by mouth daily.   30 tablet   4   . Multiple Vitamin (MULTIVITAMIN WITH MINERALS) TABS   Oral   Take 1 tablet by mouth daily.           BP 154/96  Pulse 118  Temp(Src) 98 F (36.7 C) (Oral)  Resp 13  SpO2 100%  Physical Exam  Nursing note and vitals reviewed. Constitutional: He is oriented to person, place, and time. He appears well-developed and well-nourished. No distress.  HENT:  Head: Normocephalic and atraumatic.  Mouth/Throat: Oropharynx is clear and moist.  Eyes: EOM are normal. Pupils are equal, round, and reactive to light.  Neck: Normal range of motion. Neck supple.  Cardiovascular: Regular rhythm, normal heart sounds and intact distal pulses.  Tachycardia present.   Pulmonary/Chest: Effort normal and breath sounds normal. He has no wheezes. He has no rales.  Abdominal: Soft. Bowel sounds are normal. He exhibits no distension. There is no tenderness. There is no rebound and no guarding.  Musculoskeletal: Normal range of motion. He exhibits no edema  and no tenderness.  Full muscle strength and sensation upper and lower extremities. Normal cerebellar function. No cranial nerive deficits. Ambulates without difficulty.  Lymphadenopathy:    He has no cervical adenopathy.  Neurological: He is alert and oriented to person, place, and time. He displays normal reflexes. No cranial nerve deficit. He exhibits normal muscle tone. Coordination normal.  Skin: Skin is warm and dry. No rash noted.  Psychiatric: He has a normal mood and affect. His behavior is normal.    ED Course  Procedures (including critical care time)   Date: 04/30/2012  Rate: 111  Rhythm: sinus tachycardia  QRS Axis: normal  Intervals: normal  ST/T Wave abnormalities: normal  Conduction Disutrbances:none  Narrative Interpretation:   Old EKG Reviewed: changes noted noted to be tachycardic compared to EKG on 04/07/12    Labs Reviewed  CBC WITH DIFFERENTIAL - Abnormal; Notable for the following:    RBC 3.40 (*)    Hemoglobin 12.7 (*)    HCT 37.2 (*)    MCV 109.4 (*)    MCH 37.4 (*)    All other components within normal limits  COMPREHENSIVE METABOLIC PANEL  MAGNESIUM   No results found.   No diagnosis found.    MDM  25M with ho alcohol abuse, seizures, and CVA here with tingling left arm for the last 6 hours. No weakness or pain. Last alcohol use was last night and was mouthwash. Normally he drinks a pint of mouthwash daily. He was just discharged from here yesterday after treatment for status epilepticus and delirium. Exam as noted above. He is neurologically intacy. No concern for focal seizure or CVA. He is tachycardic and has some tremors. He may have mild alcohol withdrawal symptoms. Will check CBC, CMP, magnesium and give dose of Ativan and IVF. Do not think he warrants neuroimaging at this time. Will transfer patient to the attending, Dr. Rhunette Croft, pending rest of workup.   Johnnette Gourd, MD 04/30/12 2057  I saw and evaluated the patient, reviewed the  resident's note and I agree with the findings and plan.  Pt comes in with numbness on the left side. Appears that he had similar numbness before as well. He has hx of stroke, neck trauma in the past - with no cspine tenderness at this time. He has some right sided residual weakness, otherwise neuro exam is normal.  Will get EKG and troponin as well. Hx of alcohol use - could be neuropathy. No signs of severe alcohol withdrawal at this time.   Derwood Kaplan, MD 04/30/12 2310

## 2012-04-30 NOTE — ED Notes (Signed)
Pt states he is seeing spider and is unable to get into his plavix medication. Pt was walking to [employer] when he noticed his left arm started tingling, and than developed left sided chest warmness

## 2012-04-30 NOTE — Care Management Note (Signed)
    Page 1 of 1   04/30/2012     7:47:49 AM   CARE MANAGEMENT NOTE 04/30/2012  Patient:  [name] [name],[name] [name]   Account Number:  [id number]  Date Initiated:  04/28/2012  Documentation initiated by:  [facility]  Subjective/Objective Assessment:   admitted with seizure, alcohol withdrawal     Action/Plan:   homeless, CSW referral   Anticipated DC Date:  05/01/2012   Anticipated DC Plan:  HOME/SELF CARE  In-house referral  Clinical Social Worker      DC Planning Services  CM consult      Choice offered to / List presented to:             Status of service:  Completed, signed off Medicare Important Message given?   (If response is "NO", the following Medicare IM given date fields will be blank) Date Medicare IM given:   Date Additional Medicare IM given:    Discharge Disposition:  HOME/SELF CARE  Per UR Regulation:  Reviewed for med. necessity/level of care/duration of stay  If discussed at Long Length of Stay Meetings, dates discussed:    Comments:

## 2012-04-30 NOTE — ED Notes (Signed)
Pt arrived by EMS via [facility]. Pt is homeless. Pt states he felt a numbness and tingling in his left arm after waking up and it was also warm. He has a Hx of stroke with rt side deficits. Hx of Alcohol abuse and states his last intake was yesterday and it was mouthwash. NKDA, 20 ga left AC

## 2012-05-01 NOTE — ED Notes (Signed)
Pt states understanding of discharge instructions 

## 2012-05-04 ENCOUNTER — Encounter (HOSPITAL_COMMUNITY): Payer: Self-pay | Admitting: Emergency Medicine

## 2012-05-04 ENCOUNTER — Emergency Department (HOSPITAL_COMMUNITY)
Admission: EM | Admit: 2012-05-04 | Discharge: 2012-05-04 | Disposition: A | Discharge status: Home / Self Care | Payer: Self-pay | Attending: Emergency Medicine | Admitting: Emergency Medicine

## 2012-05-04 DIAGNOSIS — Z8673 Personal history of transient ischemic attack (TIA), and cerebral infarction without residual deficits: Secondary | ICD-10-CM | POA: Insufficient documentation

## 2012-05-04 DIAGNOSIS — I10 Essential (primary) hypertension: Secondary | ICD-10-CM | POA: Insufficient documentation

## 2012-05-04 DIAGNOSIS — Z862 Personal history of diseases of the blood and blood-forming organs and certain disorders involving the immune mechanism: Secondary | ICD-10-CM | POA: Insufficient documentation

## 2012-05-04 DIAGNOSIS — Y929 Unspecified place or not applicable: Secondary | ICD-10-CM | POA: Insufficient documentation

## 2012-05-04 DIAGNOSIS — Y939 Activity, unspecified: Secondary | ICD-10-CM | POA: Insufficient documentation

## 2012-05-04 DIAGNOSIS — F17200 Nicotine dependence, unspecified, uncomplicated: Secondary | ICD-10-CM | POA: Insufficient documentation

## 2012-05-04 DIAGNOSIS — Z8669 Personal history of other diseases of the nervous system and sense organs: Secondary | ICD-10-CM | POA: Insufficient documentation

## 2012-05-04 DIAGNOSIS — F1010 Alcohol abuse, uncomplicated: Secondary | ICD-10-CM | POA: Insufficient documentation

## 2012-05-04 DIAGNOSIS — Z8639 Personal history of other endocrine, nutritional and metabolic disease: Secondary | ICD-10-CM | POA: Insufficient documentation

## 2012-05-04 DIAGNOSIS — Z7902 Long term (current) use of antithrombotics/antiplatelets: Secondary | ICD-10-CM | POA: Insufficient documentation

## 2012-05-04 DIAGNOSIS — W010XXA Fall on same level from slipping, tripping and stumbling without subsequent striking against object, initial encounter: Secondary | ICD-10-CM | POA: Insufficient documentation

## 2012-05-04 DIAGNOSIS — Z79899 Other long term (current) drug therapy: Secondary | ICD-10-CM | POA: Insufficient documentation

## 2012-05-04 LAB — ACETAMINOPHEN LEVEL: Acetaminophen (Tylenol), Serum: <15 ug/mL (ref 10–30)

## 2012-05-04 LAB — CBC WITH DIFFERENTIAL/PLATELET
Eosinophils Relative: 1 % (ref 0–5)
HCT: 36.3 % — ABNORMAL LOW (ref 39.0–52.0)
Lymphocytes Relative: 16 % (ref 12–46)
Lymphs Abs: 1 10*3/uL (ref 0.7–4.0)
MCV: 107.7 fL — ABNORMAL HIGH (ref 78.0–100.0)
Monocytes Absolute: 0.4 10*3/uL (ref 0.1–1.0)
Monocytes Relative: 7 % (ref 3–12)
RBC: 3.37 MIL/uL — ABNORMAL LOW (ref 4.22–5.81)
WBC: 6.2 10*3/uL (ref 4.0–10.5)

## 2012-05-04 LAB — COMPREHENSIVE METABOLIC PANEL
Albumin: 4 g/dL (ref 3.5–5.2)
BUN: 18 mg/dL (ref 6–23)
Chloride: 103 mEq/L (ref 96–112)
Creatinine, Ser: 0.64 mg/dL (ref 0.50–1.35)
Total Bilirubin: 0.2 mg/dL — ABNORMAL LOW (ref 0.3–1.2)
Total Protein: 7.7 g/dL (ref 6.0–8.3)

## 2012-05-04 LAB — RAPID URINE DRUG SCREEN, HOSP PERFORMED
Amphetamines: NOT DETECTED
Barbiturates: NOT DETECTED
Benzodiazepines: NOT DETECTED
Tetrahydrocannabinol: NOT DETECTED

## 2012-05-04 MED ORDER — LORAZEPAM 2 MG/ML IJ SOLN
1.0000 mg | Freq: Once | INTRAMUSCULAR | Status: AC
  Administered 2012-05-04: 1 mg via INTRAVENOUS
  Filled 2012-05-04: qty 1

## 2012-05-04 NOTE — ED Notes (Addendum)
Pt c/o fall and drinking mouthwash last night. Pt c/o left arm pain. Pt reports being arrested last night. Friend of pt advise him to come to ED due to not acting himself. Pt seen here the other night for left arm pain. Pt c/o feeling dizzy. Pt admits to drinking 1 pint of mouthwash last night. Pt denies attempt to harm self, reports drank mouthwash because, "you cant be charged with open container with mouthwash.

## 2012-05-04 NOTE — ED Notes (Signed)
Meal Bag given to patient.

## 2012-05-04 NOTE — ED Provider Notes (Addendum)
History     CSN: [id number]  Arrival date & time 05/04/12  1547   First MD Initiated Contact with Patient 05/04/12 1651      Chief Complaint  Patient presents with  . Fall    (Consider location/radiation/quality/duration/timing/severity/associated sxs/prior treatment) Patient is a 62 y.o. male presenting with fall. The history is provided by the patient.  Fall Pertinent negatives include no fever, no numbness, no abdominal pain, no vomiting and no headaches.  pt w hx etoh abuse, states feel last pm, states slipped on ice, denies hitting head or loc. No faintness or dizziness. Currently denies pain or injury. No headache. No neck or back pain. No abd pain. No nv. Ambulatory since fall. Denies wanting rehab/detox program for etoh abuse. Pt states feels at baseline.     Past Medical History  Diagnosis Date  . Hypercholesteremia   . Stroke   . Hypertension   . Seizures   . Alcohol abuse     History reviewed. No pertinent past surgical history.  No family history on file.  History  Substance Use Topics  . Smoking status: Current Every Day Smoker -- 0.25 packs/day    Types: Cigarettes  . Smokeless tobacco: Not on file  . Alcohol Use: Yes     Comment: drinks bottles of 1pint  mouthwash daily      Review of Systems  Constitutional: Negative for fever and chills.  HENT: Negative for neck pain.   Eyes: Negative for pain and visual disturbance.  Respiratory: Negative for cough and shortness of breath.   Cardiovascular: Negative for chest pain and leg swelling.  Gastrointestinal: Negative for vomiting and abdominal pain.  Genitourinary: Negative for flank pain.  Musculoskeletal: Negative for back pain.  Skin: Negative for wound.  Neurological: Negative for weakness, numbness and headaches.  Hematological: Does not bruise/bleed easily.  Psychiatric/Behavioral: Negative for confusion.    Allergies  Review of patient's allergies indicates no known allergies.  Home  Medications   Current Outpatient Rx  Name  Route  Sig  Dispense  Refill  . amLODipine (NORVASC) 10 MG tablet   Oral   Take 1 tablet (10 mg total) by mouth daily.   30 tablet   4   . clopidogrel (PLAVIX) 75 MG tablet   Oral   Take 75 mg by mouth daily with breakfast.         . lisinopril (PRINIVIL,ZESTRIL) 5 MG tablet   Oral   Take 5 mg by mouth daily.         . Multiple Vitamin (MULTIVITAMIN WITH MINERALS) TABS   Oral   Take 1 tablet by mouth daily.           BP 153/91  Pulse 121  Temp(Src) 98.8 F (37.1 C) (Oral)  Resp 20  SpO2 98%  Physical Exam  Nursing note and vitals reviewed. Constitutional: He is oriented to person, place, and time. He appears well-developed and well-nourished. No distress.  HENT:  Head: Atraumatic.  Mouth/Throat: Oropharynx is clear and moist.  Eyes: Conjunctivae are normal. Pupils are equal, round, and reactive to light.  Neck: Normal range of motion. Neck supple. No tracheal deviation present.  Cardiovascular: Regular rhythm, normal heart sounds and intact distal pulses.   Pulmonary/Chest: Effort normal and breath sounds normal. No accessory muscle usage. No respiratory distress.  Abdominal: Soft. Bowel sounds are normal. He exhibits no distension. There is no tenderness.  Musculoskeletal: Normal range of motion. He exhibits no edema and no tenderness.  CTLS spine,  non tender, aligned, no step off. No focal bony tenderness w good rom on bil ext exam.   Neurological: He is alert and oriented to person, place, and time.  Steady gait  Skin: Skin is warm and dry.  Psychiatric: He has a normal mood and affect.    ED Course  Procedures (including critical care time)   Results for orders placed during the hospital encounter of 05/04/12  CBC WITH DIFFERENTIAL      Result Value Range   WBC 6.2  4.0 - 10.5 K/uL   RBC 3.37 (*) 4.22 - 5.81 MIL/uL   Hemoglobin 12.5 (*) 13.0 - 17.0 g/dL   HCT 16.1 (*) 09.6 - 04.5 %   MCV 107.7 (*) 78.0  - 100.0 fL   MCH 37.1 (*) 26.0 - 34.0 pg   MCHC 34.4  30.0 - 36.0 g/dL   RDW 40.9  81.1 - 91.4 %   Platelets 338  150 - 400 K/uL   Neutrophils Relative 76  43 - 77 %   Neutro Abs 4.7  1.7 - 7.7 K/uL   Lymphocytes Relative 16  12 - 46 %   Lymphs Abs 1.0  0.7 - 4.0 K/uL   Monocytes Relative 7  3 - 12 %   Monocytes Absolute 0.4  0.1 - 1.0 K/uL   Eosinophils Relative 1  0 - 5 %   Eosinophils Absolute 0.1  0.0 - 0.7 K/uL   Basophils Relative 1  0 - 1 %   Basophils Absolute 0.0  0.0 - 0.1 K/uL  ETHANOL      Result Value Range   Alcohol, Ethyl (B) <11  0 - 11 mg/dL  URINE RAPID DRUG SCREEN (HOSP PERFORMED)      Result Value Range   Opiates NONE DETECTED  NONE DETECTED   Cocaine NONE DETECTED  NONE DETECTED   Benzodiazepines NONE DETECTED  NONE DETECTED   Amphetamines NONE DETECTED  NONE DETECTED   Tetrahydrocannabinol NONE DETECTED  NONE DETECTED   Barbiturates NONE DETECTED  NONE DETECTED  ACETAMINOPHEN LEVEL      Result Value Range   Acetaminophen (Tylenol), Serum <15.0  10 - 30 ug/mL  SALICYLATE LEVEL      Result Value Range   Salicylate Lvl <2.0 (*) 2.8 - 20.0 mg/dL  COMPREHENSIVE METABOLIC PANEL      Result Value Range   Sodium 141  135 - 145 mEq/L   Potassium 3.6  3.5 - 5.1 mEq/L   Chloride 103  96 - 112 mEq/L   CO2 27  19 - 32 mEq/L   Glucose, Bld 99  70 - 99 mg/dL   BUN 18  6 - 23 mg/dL   Creatinine, Ser 7.82  0.50 - 1.35 mg/dL   Calcium 9.4  8.4 - 95.6 mg/dL   Total Protein 7.7  6.0 - 8.3 g/dL   Albumin 4.0  3.5 - 5.2 g/dL   AST 25  0 - 37 U/L   ALT 26  0 - 53 U/L   Alkaline Phosphatase 70  39 - 117 U/L   Total Bilirubin 0.2 (*) 0.3 - 1.2 mg/dL   GFR calc non Af Amer >90  >90 mL/min   GFR calc Af Amer >90  >90 mL/min      MDM  Labs from triage.   Pt currently denies any c/o. No pain. Ambulates about ed with steady gait back and forth to bathroom.  Watching tv, comfortable appearing.   Ativan 1 mg po. Po fluids.  Recheck pt comfortable. No tremor or  shakes. No nv. No diaphoresis. Denies pain.    Recheck no tremor or shakes. Eating and drinking. Declines inpt rehab/detox eval.  Will give referrals for outpt etoh rehab programs/resource guide.   Hr 94 rr 16.  Pt appears stable for d/c.   Offered etoh/detox second time - pt refuses.   Pt is alert, oriented, no hallucinations, delusions, or acute psychosis - clear thought processes.  Pt indicates not interested in rehab or stopping drinking.  Feels his drinking is not problematic.  Plans to continue drinking.  He denies any thoughts of either wanting to harm self or planning to harm self.    Suzi Roots, MD 05/04/12 1904  Suzi Roots, MD 05/04/12 [id number]

## 2012-05-04 NOTE — ED Notes (Signed)
I gave the patient a warm blanket. 

## 2012-05-04 NOTE — ED Notes (Signed)
Patient states he is here today because he was here a few days ago and was diagnosed with neuropathy and is here today for the same pain in his left arm. He states he just was released from jail this am.

## 2012-05-05 ENCOUNTER — Encounter (HOSPITAL_COMMUNITY): Payer: Self-pay | Admitting: Emergency Medicine

## 2012-05-05 ENCOUNTER — Emergency Department (HOSPITAL_COMMUNITY)
Admission: EM | Admit: 2012-05-05 | Discharge: 2012-05-05 | Disposition: A | Discharge status: Home / Self Care | Payer: Self-pay | Attending: Emergency Medicine | Admitting: Emergency Medicine

## 2012-05-05 DIAGNOSIS — F17200 Nicotine dependence, unspecified, uncomplicated: Secondary | ICD-10-CM | POA: Insufficient documentation

## 2012-05-05 DIAGNOSIS — Z8669 Personal history of other diseases of the nervous system and sense organs: Secondary | ICD-10-CM | POA: Insufficient documentation

## 2012-05-05 DIAGNOSIS — F1010 Alcohol abuse, uncomplicated: Secondary | ICD-10-CM | POA: Insufficient documentation

## 2012-05-05 DIAGNOSIS — Z862 Personal history of diseases of the blood and blood-forming organs and certain disorders involving the immune mechanism: Secondary | ICD-10-CM | POA: Insufficient documentation

## 2012-05-05 DIAGNOSIS — Z8673 Personal history of transient ischemic attack (TIA), and cerebral infarction without residual deficits: Secondary | ICD-10-CM | POA: Insufficient documentation

## 2012-05-05 DIAGNOSIS — R002 Palpitations: Secondary | ICD-10-CM | POA: Insufficient documentation

## 2012-05-05 DIAGNOSIS — Z7902 Long term (current) use of antithrombotics/antiplatelets: Secondary | ICD-10-CM | POA: Insufficient documentation

## 2012-05-05 DIAGNOSIS — I10 Essential (primary) hypertension: Secondary | ICD-10-CM | POA: Insufficient documentation

## 2012-05-05 DIAGNOSIS — Z79899 Other long term (current) drug therapy: Secondary | ICD-10-CM | POA: Insufficient documentation

## 2012-05-05 DIAGNOSIS — Z8639 Personal history of other endocrine, nutritional and metabolic disease: Secondary | ICD-10-CM | POA: Insufficient documentation

## 2012-05-05 MED ORDER — LORAZEPAM 1 MG PO TABS
1.0000 mg | ORAL_TABLET | Freq: Once | ORAL | Status: AC
  Administered 2012-05-05: 1 mg via ORAL
  Filled 2012-05-05: qty 1

## 2012-05-05 NOTE — ED Notes (Signed)
Security came and wand the pt. Waiting for bed in POD C

## 2012-05-05 NOTE — ED Notes (Addendum)
Stated drinking the night before fall and police pick him and let him go.  Sleeping over at [facility] and pain starting on right arm and was brought to [facility] . Pt was discharged at 0300 and fell asleep in the waiting room, wake up with heart palpation and numbness to right arm.

## 2012-05-05 NOTE — ED Notes (Signed)
Patient has been sleeping comfortably all night in the ED lobby since discharge. Woke up around 0630 and reported to nurse first that he was having palpitations. Patient registered and taken to room to be examined.

## 2012-05-05 NOTE — ED Provider Notes (Signed)
Medical screening examination/treatment/procedure(s) were performed by non-physician practitioner and as supervising physician I was immediately available for consultation/collaboration.  Olivia Mackie, MD 05/05/12 [phone]

## 2012-05-05 NOTE — ED Provider Notes (Signed)
History     CSN: [id number]  Arrival date & time 05/05/12  4098   First MD Initiated Contact with Patient 05/05/12 [phone]      Chief Complaint  Patient presents with  . Palpitations    (Consider location/radiation/quality/duration/timing/severity/associated sxs/prior treatment) HPI Patient is a 62 yo male with a history of hypertension, alcoholism and a stroke presents today with chest pounding. He got discharged this morning at 3am today for neuropathy and when he was in the waiting room sleeping he woke up his chest pounding at 6:45am.  Never had this happen before and has taken his blood pressure medications properly with no new recent medications.  His last alcoholic beverage was on Saturday.  Denies any chest pain, shortness of breath, diaphoresis, abdominal pain, nausea, vomiting or pain.      Past Medical History  Diagnosis Date  . Hypercholesteremia   . Stroke   . Hypertension   . Seizures   . Alcohol abuse     History reviewed. No pertinent past surgical history.  No family history on file.  History  Substance Use Topics  . Smoking status: Current Every Day Smoker -- 0.25 packs/day    Types: Cigarettes  . Smokeless tobacco: Not on file  . Alcohol Use: Yes     Comment: drinks bottles of 1pint  mouthwash daily      Review of Systems All other systems negative except as documented in the HPI. All pertinent positives and negatives as reviewed in the HPI.  Allergies  Review of patient's allergies indicates no known allergies.  Home Medications   Current Outpatient Rx  Name  Route  Sig  Dispense  Refill  . amLODipine (NORVASC) 10 MG tablet   Oral   Take 1 tablet (10 mg total) by mouth daily.   30 tablet   4   . clopidogrel (PLAVIX) 75 MG tablet   Oral   Take 75 mg by mouth daily with breakfast.         . lisinopril (PRINIVIL,ZESTRIL) 5 MG tablet   Oral   Take 5 mg by mouth daily.         . Multiple Vitamin (MULTIVITAMIN WITH MINERALS) TABS  Oral   Take 1 tablet by mouth daily.           BP 154/93  Pulse 93  Temp(Src) 98.8 F (37.1 C) (Oral)  SpO2 100%  Physical Exam  Constitutional: He is oriented to person, place, and time. He appears well-developed and well-nourished. No distress.  HENT:  Head: Normocephalic and atraumatic.  Right Ear: External ear normal.  Left Ear: External ear normal.  Eyes: Conjunctivae and EOM are normal. Pupils are equal, round, and reactive to light. Right eye exhibits no discharge. Left eye exhibits no discharge. No scleral icterus.  Neck: Normal range of motion.  Cardiovascular: Normal rate, regular rhythm and normal heart sounds.  Exam reveals no gallop and no friction rub.   No murmur heard. Pulmonary/Chest: Effort normal and breath sounds normal. No respiratory distress. He has no wheezes. He has no rales. He exhibits no tenderness.  Abdominal: Soft. He exhibits no distension and no mass. There is no tenderness. There is no rebound and no guarding.  Neurological: He is alert and oriented to person, place, and time. No cranial nerve deficit.  Skin: No rash noted. He is not diaphoretic. No erythema. No pallor.  Psychiatric: He has a normal mood and affect. His behavior is normal. Judgment and thought content normal.  ED Course  Procedures (including critical care time)  8:00am Patient was able to walk/go to the bathroom on his own without any difficulty  Patient recheck at 8:25 AM and is resting in the bed in no distress. The patient is again advised to follow up with outpatient detox resources if he would like any help. The patient is not showing any signs of DTs here in the ER. The patient is requesting coffee at this time.   MDM   Date: 05/05/2012 06:45AM  Rate: 86  Rhythm: normal sinus rhythm  QRS Axis: normal  Intervals: normal  ST/T Wave abnormalities: normal  Conduction Disutrbances:none  Narrative Interpretation:   Old EKG Reviewed: changes noted Tachy on previous  visit last night   Date: 05/05/2012 08:28AM  Rate: 85 Rhythm: normal sinus rhythm  QRS Axis: normal  Intervals: normal  ST/T Wave abnormalities: normal  Conduction Disutrbances:none  Narrative Interpretation:   Old EKG Reviewed: unchanged            Carlyle Dolly, PA-C 05/05/12 [phone]

## 2012-05-07 NOTE — Discharge Summary (Addendum)
[facility] Discharge Note  Name: [name] [name] MRN: [id number] DOB: 1951-02-14 62 y.o.  Date of Admission: 04/25/2012  8:08 PM Date of Discharge: 04/29/2012 Attending Physician: Dr. Josem Kaufmann  Discharge Diagnosis: Principal Problem:   Seizure Active Problems:   HYPERTENSION   CVA   ETOH abuse   Status epilepticus   Hallucinations   Discharge Medications:   Medication List    STOP taking these medications       lisinopril 5 MG tablet  Commonly known as:  PRINIVIL,ZESTRIL      TAKE these medications       amLODipine 10 MG tablet  Commonly known as:  NORVASC  Take 1 tablet (10 mg total) by mouth daily.     clopidogrel 75 MG tablet  Commonly known as:  PLAVIX  Take 75 mg by mouth daily with breakfast.     multivitamin with minerals Tabs  Take 1 tablet by mouth daily.        Disposition and follow-up:   CurtisCledis [name] was discharged from [facility] in stable condition.  At the hospital follow up visit please address the following:  -compliance with keppra therapy -BP check and titration of medications -mouthwash cessation counseling  Follow-up Appointments:      Discharge Orders   Future Orders Complete By Expires     Diet - low sodium heart healthy  As directed     Discharge instructions  As directed     Comments:      Please take Keppra as prescribed    Increase activity slowly  As directed        Consultations: Treatment Team:  Mickeal Skinner, MD  Procedures Performed:  Dg Chest 2 View  04/27/2012  *RADIOLOGY REPORT*  Clinical Data: Status epilepticus, gap acidosis  CHEST - 2 VIEW  Comparison: Prior chest x-ray 04/21/2012  Findings: Stable appearance of the lungs with bibasilar patchy opacities and mild cardiomegaly.  No new airspace consolidation, pleural effusion or pneumothorax.  Chronic central bronchitic changes are unchanged. No acute osseous abnormality.  IMPRESSION: No significant  interval change in the appearance of the chest with chronic bibasilar patchy opacities.   Original Report Authenticated By: Malachy Moan, M.D.    X-ray Chest Pa And Lateral   04/21/2012  *RADIOLOGY REPORT*  Clinical Data: Altered mental status, loss of consciousness, history stroke, hypertension, seizures  CHEST - 2 VIEW  Comparison: 04/07/2012  Findings: Upper normal heart size. Mediastinal contours and pulmonary vascularity normal. New opacity in the medial right lung base question mild atelectasis versus infiltrate. Remaining lungs clear. No pleural effusion or pneumothorax. No acute osseous findings.  IMPRESSION: Atelectasis versus infiltrate at medial right lung base.   Original Report Authenticated By: Ulyses Southward, M.D.    Ct Head Wo Contrast  04/21/2012  *RADIOLOGY REPORT*  Clinical Data: Left-sided weakness.  CT HEAD WITHOUT CONTRAST  Technique:  Contiguous axial images were obtained from the base of the skull through the vertex without contrast.  Comparison: 04/07/2012  Findings: There is no evidence for acute hemorrhage, hydrocephalus, mass lesion, or abnormal extra-axial fluid collection.  No definite CT evidence for acute infarction.  Patchy low attenuation in the deep hemispheric and periventricular white matter is nonspecific, but likely reflects chronic microvascular ischemic demyelination. Lacunar infarcts are seen in the left basal ganglia and left pons, as before. The visualized paranasal sinuses and mastoid air cells are clear.  IMPRESSION: Stable.  No acute intracranial abnormality.  I personally called the results  of the study to Dr. Rosalia Hammers in the emergency department at 1036 hours on 04/21/2012.   Original Report Authenticated By: Kennith Center, M.D.    Mr Brain Wo Contrast  04/21/2012  *RADIOLOGY REPORT*  Clinical Data: Witnessed seizure.  MRI HEAD WITHOUT CONTRAST  Technique:  Multiplanar, multiecho pulse sequences of the brain and surrounding structures were obtained according to  standard protocol without intravenous contrast.  Comparison: Head CT same day.  Multiple previous head CT exams. Most recent MRI 12/30/2009  Findings: Diffusion imaging does not show any acute or subacute infarction.  There are pronounced chronic small vessel changes affecting the pons.  There are a few old small vessel cerebellar infarctions.  The cerebral hemispheres show atrophy with old lacunar infarction at the left lateral thalamus/posterior limb internal capsule and a few old small vessel infarctions in the left basal ganglia.  There is mild chronic small vessel change within the hemispheric deep white matter.  No mass lesion, hemorrhage, hydrocephalus or extra-axial collection.  No pituitary mass.  No inflammatory sinus disease.  No skull or skull base lesion.  IMPRESSION: No acute finding.  Old small vessel ischemic changes primarily affecting the pons and left thalamus and basal ganglia.   Original Report Authenticated By: Paulina Fusi, M.D.     Admission HPI: Level 5 caveat applies; patient is too sedated to answer questions, as such history obtained from the medical record and EDP.  [name] [name] is a 62 year old male with a history of alcohol abuse, homelessness, hypertension, left thalamic CVA in 2011 who was previously admitted to [facility] from 2/3-2/5 for seizures related to alcohol withdrawal. He is a longterm alcoholic and had been drinking about a pint of mouthwash per day. He was discharged on Keppra on 04/23/12.  Today he presents from a jail holding cell, where he was found shaking and with an empty bottle of Listerine (consumed likely between 1-5pm 04/25/12). Unable to meaningfully engage in HPI with EDP or neurologist in the ED. On arrival, left arm, face, and leg were shaking. He was able to talk and oriented during this episode. Pt was given 4 Ativan with some improvement but still some seizure activity. Loaded with keppra and neurology was consulted and added fosphenytoin.    Hospital  Course by problem list: Principal Problem:   Seizure Active Problems:   HYPERTENSION   CVA   ETOH abuse   Status epilepticus   Hallucinations  Status epilepticus The patient again presented in status epilepticus, likely from alcohol withdrawal vs listerine overdose. Patient given fosphenytoin and Keppra load in the ED as well as IV Ativan which stopped his seizure activity. CT of the head from previous admission did not show any evidence of hemorrhage or infarction, no other intracranial abnormalities or seizure focus, MRI without any acute or subacute infarct. Patient does have hx of stroke as well as toxin ingestion with mouthwash, both may be contributing to seizures. He did not have any further seizures during this hospitalization on his home dose of Keppra, which he should continue on discharge.  Paranoia/Hallucinations  During this hospitalization, patient did have some hallucinations of spiders crawling in his food. Unclear whether this is from chronic ETOH/mouthwash use or withdrawal vs. Undiagnosed underlying psychiatric disorder. Psychiatry was consulted and felt this was likely due to ETOH use. His symptoms quickly resolved with the administration of Ativan and he was not having any hallucinations on the day of discharge.  Alcohol abuse  Patient states that he has been drinking about  a pint of mouthwash per day, looking at the chart, likely for about 6 years. Seizures may have been from toxicity vs etoh withdrawal as ETOH level on admission was <11. Patient drinks Assured mouthwash which he gets from the [employer] for $1 which has 21.6% alcohol content. We continued to counsel patient on the harmful effects of mouthwash and encouraged cessation. We did try to get him to a rehab/detox facility, however, patient declined admission.  Hypertension  The patient has a history of hypertension and medication non-compliance. He was continued on his home norvasc and lisinopril but was  hypertensive during admission. We increased his lisinopril to 20mg  QD, which he should continue on discharge.   Discharge Vitals:  BP 158/95  Pulse 80  Temp(Src) 97.8 F (36.6 C) (Oral)  Resp 20  Ht 5\' 7"  (1.702 m)  Wt 156 lb 12 oz (71.1 kg)  BMI 24.54 kg/m2  SpO2 100%   Signed: Denton Ar 05/07/2012, 12:37 PM   Time Spent on Discharge: 35 m Services Ordered on Discharge: none Equipment Ordered on Discharge: none

## 2012-05-08 ENCOUNTER — Encounter (HOSPITAL_COMMUNITY): Payer: Self-pay | Admitting: Emergency Medicine

## 2012-05-08 ENCOUNTER — Emergency Department (HOSPITAL_COMMUNITY)
Admission: EM | Admit: 2012-05-08 | Discharge: 2012-05-09 | Disposition: A | Discharge status: Rehabilitation Facility | Payer: Self-pay | Attending: Emergency Medicine | Admitting: Emergency Medicine

## 2012-05-08 DIAGNOSIS — Z862 Personal history of diseases of the blood and blood-forming organs and certain disorders involving the immune mechanism: Secondary | ICD-10-CM | POA: Insufficient documentation

## 2012-05-08 DIAGNOSIS — F1010 Alcohol abuse, uncomplicated: Secondary | ICD-10-CM

## 2012-05-08 DIAGNOSIS — R350 Frequency of micturition: Secondary | ICD-10-CM | POA: Insufficient documentation

## 2012-05-08 DIAGNOSIS — R5381 Other malaise: Secondary | ICD-10-CM | POA: Insufficient documentation

## 2012-05-08 DIAGNOSIS — Z8669 Personal history of other diseases of the nervous system and sense organs: Secondary | ICD-10-CM | POA: Insufficient documentation

## 2012-05-08 DIAGNOSIS — I10 Essential (primary) hypertension: Secondary | ICD-10-CM | POA: Insufficient documentation

## 2012-05-08 DIAGNOSIS — Z79899 Other long term (current) drug therapy: Secondary | ICD-10-CM | POA: Insufficient documentation

## 2012-05-08 DIAGNOSIS — R269 Unspecified abnormalities of gait and mobility: Secondary | ICD-10-CM | POA: Insufficient documentation

## 2012-05-08 DIAGNOSIS — R443 Hallucinations, unspecified: Secondary | ICD-10-CM | POA: Insufficient documentation

## 2012-05-08 DIAGNOSIS — Z8639 Personal history of other endocrine, nutritional and metabolic disease: Secondary | ICD-10-CM | POA: Insufficient documentation

## 2012-05-08 DIAGNOSIS — F17200 Nicotine dependence, unspecified, uncomplicated: Secondary | ICD-10-CM | POA: Insufficient documentation

## 2012-05-08 DIAGNOSIS — Z7902 Long term (current) use of antithrombotics/antiplatelets: Secondary | ICD-10-CM | POA: Insufficient documentation

## 2012-05-08 DIAGNOSIS — Z8673 Personal history of transient ischemic attack (TIA), and cerebral infarction without residual deficits: Secondary | ICD-10-CM | POA: Insufficient documentation

## 2012-05-08 DIAGNOSIS — Z8659 Personal history of other mental and behavioral disorders: Secondary | ICD-10-CM | POA: Insufficient documentation

## 2012-05-08 HISTORY — DX: Depression, unspecified: F32.A

## 2012-05-08 HISTORY — DX: Major depressive disorder, single episode, unspecified: F32.9

## 2012-05-08 LAB — CBC WITH DIFFERENTIAL/PLATELET
Basophils Relative: 1 % (ref 0–1)
Eosinophils Absolute: 0.1 10*3/uL (ref 0.0–0.7)
Eosinophils Relative: 2 % (ref 0–5)
Hemoglobin: 12.5 g/dL — ABNORMAL LOW (ref 13.0–17.0)
Lymphs Abs: 1.5 10*3/uL (ref 0.7–4.0)
MCH: 36.7 pg — ABNORMAL HIGH (ref 26.0–34.0)
MCHC: 34.2 g/dL (ref 30.0–36.0)
MCV: 107 fL — ABNORMAL HIGH (ref 78.0–100.0)
Monocytes Absolute: 0.3 10*3/uL (ref 0.1–1.0)
Monocytes Relative: 6 % (ref 3–12)
Neutrophils Relative %: 67 % (ref 43–77)

## 2012-05-08 LAB — RAPID URINE DRUG SCREEN, HOSP PERFORMED
Amphetamines: NOT DETECTED
Benzodiazepines: NOT DETECTED
Opiates: NOT DETECTED

## 2012-05-08 LAB — COMPREHENSIVE METABOLIC PANEL
Albumin: 3.7 g/dL (ref 3.5–5.2)
BUN: 14 mg/dL (ref 6–23)
Calcium: 8.5 mg/dL (ref 8.4–10.5)
Creatinine, Ser: 0.67 mg/dL (ref 0.50–1.35)
GFR calc Af Amer: >90 mL/min (ref >90)
Glucose, Bld: 83 mg/dL (ref 70–99)
Potassium: 3.3 mEq/L — ABNORMAL LOW (ref 3.5–5.1)
Total Protein: 7.4 g/dL (ref 6.0–8.3)

## 2012-05-08 LAB — URINALYSIS, ROUTINE W REFLEX MICROSCOPIC
Glucose, UA: NEGATIVE mg/dL
Ketones, ur: NEGATIVE mg/dL
Nitrite: NEGATIVE
Specific Gravity, Urine: 1.014 (ref 1.005–1.030)
pH: 5.5 (ref 5.0–8.0)

## 2012-05-08 LAB — ETHANOL: Alcohol, Ethyl (B): 279 mg/dL — ABNORMAL HIGH (ref 0–11)

## 2012-05-08 LAB — URINE MICROSCOPIC-ADD ON

## 2012-05-08 MED ORDER — ALUM & MAG HYDROXIDE-SIMETH 200-200-20 MG/5ML PO SUSP: 30.0000 mL | ORAL | Status: DC | PRN

## 2012-05-08 MED ORDER — LORAZEPAM 1 MG PO TABS: 1.0000 mg | ORAL_TABLET | Freq: Three times a day (TID) | ORAL | Status: DC | PRN

## 2012-05-08 MED ORDER — LORAZEPAM 2 MG/ML IJ SOLN: 1.0000 mg | Freq: Four times a day (QID) | INTRAMUSCULAR | Status: DC | PRN

## 2012-05-08 MED ORDER — FOLIC ACID 1 MG PO TABS
1.0000 mg | ORAL_TABLET | Freq: Every day | ORAL | Status: DC
  Administered 2012-05-08 – 2012-05-09 (×2): 1 mg via ORAL
  Filled 2012-05-08 (×2): qty 1

## 2012-05-08 MED ORDER — LISINOPRIL 5 MG PO TABS
5.0000 mg | ORAL_TABLET | Freq: Every day | ORAL | Status: DC
  Administered 2012-05-09: 5 mg via ORAL
  Filled 2012-05-08: qty 1

## 2012-05-08 MED ORDER — IBUPROFEN 200 MG PO TABS: 600.0000 mg | ORAL_TABLET | Freq: Three times a day (TID) | ORAL | Status: DC | PRN

## 2012-05-08 MED ORDER — THIAMINE HCL 100 MG/ML IJ SOLN: 100.0000 mg | Freq: Every day | INTRAMUSCULAR | Status: DC

## 2012-05-08 MED ORDER — ONDANSETRON HCL 4 MG PO TABS: 4.0000 mg | ORAL_TABLET | Freq: Three times a day (TID) | ORAL | Status: DC | PRN

## 2012-05-08 MED ORDER — VITAMIN B-1 100 MG PO TABS
100.0000 mg | ORAL_TABLET | Freq: Every day | ORAL | Status: DC
  Administered 2012-05-08 – 2012-05-09 (×2): 100 mg via ORAL
  Filled 2012-05-08 (×2): qty 1

## 2012-05-08 MED ORDER — ZOLPIDEM TARTRATE 5 MG PO TABS: 5.0000 mg | ORAL_TABLET | Freq: Every evening | ORAL | Status: DC | PRN

## 2012-05-08 MED ORDER — IBUPROFEN 600 MG PO TABS: 600.0000 mg | ORAL_TABLET | Freq: Three times a day (TID) | ORAL | Status: DC | PRN

## 2012-05-08 MED ORDER — CLOPIDOGREL BISULFATE 75 MG PO TABS
75.0000 mg | ORAL_TABLET | Freq: Every day | ORAL | Status: DC
  Administered 2012-05-09: 75 mg via ORAL
  Filled 2012-05-08 (×2): qty 1

## 2012-05-08 MED ORDER — ACETAMINOPHEN 325 MG PO TABS: 650.0000 mg | ORAL_TABLET | ORAL | Status: DC | PRN

## 2012-05-08 MED ORDER — ADULT MULTIVITAMIN W/MINERALS CH
1.0000 | ORAL_TABLET | Freq: Every day | ORAL | Status: DC
  Administered 2012-05-08 – 2012-05-09 (×2): 1 via ORAL
  Filled 2012-05-08 (×2): qty 1

## 2012-05-08 MED ORDER — AMLODIPINE BESYLATE 10 MG PO TABS
10.0000 mg | ORAL_TABLET | Freq: Every day | ORAL | Status: DC
  Administered 2012-05-09: 10 mg via ORAL
  Filled 2012-05-08: qty 1

## 2012-05-08 MED ORDER — LORAZEPAM 1 MG PO TABS
0.0000 mg | ORAL_TABLET | Freq: Four times a day (QID) | ORAL | Status: DC
  Administered 2012-05-08: 1 mg via ORAL
  Administered 2012-05-09: 2 mg via ORAL
  Filled 2012-05-08: qty 2
  Filled 2012-05-08: qty 1

## 2012-05-08 MED ORDER — LORAZEPAM 1 MG PO TABS: 0.0000 mg | ORAL_TABLET | Freq: Two times a day (BID) | ORAL | Status: DC

## 2012-05-08 MED ORDER — LORAZEPAM 1 MG PO TABS: 1.0000 mg | ORAL_TABLET | Freq: Four times a day (QID) | ORAL | Status: DC | PRN

## 2012-05-08 NOTE — ED Provider Notes (Signed)
Medical screening examination/treatment/procedure(s) were conducted as a shared visit with non-physician practitioner(s) and myself.  I personally evaluated the patient during the encounter  Alcohol detox.  The patient is actually never been seen in our system requesting alcohol detox.  He has been intoxicated here several times.  We have seen him for several this falls.  He actually seems to be genuinely interested in stopping drinking alcohol today.  Holding orders including alcohol withdrawal protocol initiated.  Lyanne Co, MD 05/08/12 [phone]

## 2012-05-08 NOTE — ED Notes (Signed)
Urinal given and pt notified that urine sample is needed. Pt also given ice water.

## 2012-05-08 NOTE — ED Provider Notes (Signed)
History     CSN: [id number]  Arrival date & time 05/08/12  1457   First MD Initiated Contact with Patient 05/08/12 1511      Chief Complaint  Patient presents with  . Medical Clearance    (Consider location/radiation/quality/duration/timing/severity/associated sxs/prior treatment) HPI Comments: Pt presents today seeking assistance with an alcohol abuse program. Last drank mouthwash today and for the last 25 days.  No SI/HI Pt states he hears voices in his head. They are talking about "stupid stuff." Pt has multiple visits at the ED for ETOH issues and medical issues.   Patient is a 62 y.o. male presenting for alcohol detox Severity: severe Onset quality: Gradual  Duration: years Timing: Constant  Progression: worsening Relieved by: nothing Worsened by: Nothing tried  Ineffective treatments: None tried    Past Medical History  Diagnosis Date  . Hypercholesteremia   . Stroke   . Hypertension   . Seizures   . Alcohol abuse     No past surgical history on file.  No family history on file.  History  Substance Use Topics  . Smoking status: Current Every Day Smoker -- 0.25 packs/day    Types: Cigarettes  . Smokeless tobacco: Not on file  . Alcohol Use: Yes     Comment: drinks bottles of 1pint  mouthwash daily      Review of Systems  Constitutional: Negative for fever and diaphoresis.  HENT: Negative for neck pain and neck stiffness.   Eyes: Negative for visual disturbance.  Respiratory: Negative for apnea, chest tightness and shortness of breath.   Cardiovascular: Negative for chest pain and palpitations.  Gastrointestinal: Negative for nausea, vomiting, diarrhea and constipation.  Genitourinary: Positive for frequency. Negative for dysuria.  Musculoskeletal: Positive for gait problem.  Neurological: Positive for weakness. Negative for dizziness, light-headedness, numbness and headaches.  Psychiatric/Behavioral: Positive for hallucinations. Negative for  suicidal ideas.       Pt states he hears voices. They are "saying stupid stuff."     Allergies  Review of patient's allergies indicates no known allergies.  Home Medications   Current Outpatient Rx  Name  Route  Sig  Dispense  Refill  . amLODipine (NORVASC) 10 MG tablet   Oral   Take 1 tablet (10 mg total) by mouth daily.   30 tablet   4   . clopidogrel (PLAVIX) 75 MG tablet   Oral   Take 75 mg by mouth daily with breakfast.         . lisinopril (PRINIVIL,ZESTRIL) 5 MG tablet   Oral   Take 5 mg by mouth daily.         . Multiple Vitamin (MULTIVITAMIN WITH MINERALS) TABS   Oral   Take 1 tablet by mouth daily.           There were no vitals taken for this visit.  Physical Exam  Nursing note and vitals reviewed. Constitutional: He is oriented to person, place, and time. He appears well-developed and well-nourished. No distress.  HENT:  Head: Normocephalic and atraumatic.  Eyes: Conjunctivae and EOM are normal.  Neck: Normal range of motion. Neck supple.  No meningeal signs  Cardiovascular: Normal rate, regular rhythm and normal heart sounds.   Pulmonary/Chest: Effort normal and breath sounds normal. No respiratory distress. He has no wheezes. He has no rales. He exhibits no tenderness.  Abdominal: Soft. There is no tenderness.  Musculoskeletal: Normal range of motion.  Muscle strength 5/5 throughout though pt though pt ambulated with difficulty and  unsteadiness.  Neurological: He is alert and oriented to person, place, and time. No cranial nerve deficit.  No focal deficits  Skin: Skin is warm and dry. He is not diaphoretic.  Psychiatric:  Pt is circumferential. Cooperative.    ED Course  Procedures (including critical care time)  Labs Reviewed  CBC WITH DIFFERENTIAL  COMPREHENSIVE METABOLIC PANEL  URINE RAPID DRUG SCREEN (HOSP PERFORMED)  ETHANOL  URINALYSIS, ROUTINE W REFLEX MICROSCOPIC   No results found.  Diagnosis: no diagnosis at time of  transfer of care.    MDM  Pt presents to the ED for medical clearance for assistance with ETOH detox after 25 days of drinking mouthwash. PMHx significant for CVA.   Pt has multiple ER visits for a variety of complaints, many including alcohol detox, but no real treatment attempt. Pt has no SI/HI, but does state he hears voices "saying stupid stuff."  Pt presents to the ED for medical clearance.  The patient currently does not have any acute physical complaints and is in no acute distress. The patients demeanor is cooperative. The patient was brought to ED by self an is seeking voluntarily assistance for ETOH detox. ACT consult contacted but not appreciated at time of transfer of care.    Glade Nurse, PA-C 05/08/12 1553

## 2012-05-08 NOTE — ED Notes (Signed)
Pt ambulated to BR with minimal assist.

## 2012-05-08 NOTE — ED Notes (Signed)
Pt was [place] up from the bus stop and wants to have rehab for drinking listerine. Pt is not SI/HI was seen here on the 2/17.

## 2012-05-09 ENCOUNTER — Encounter (HOSPITAL_COMMUNITY): Payer: Self-pay | Admitting: Emergency Medicine

## 2012-05-09 NOTE — BH Assessment (Signed)
Assessment Note   [name] [name] is a 62 y.o. male presenting to [facility] for detox from alcohol.  Pt denies SI/HI/Psych.  Pt reports the following: pt is homeless and has been for 8 yrs. Pt says he consumes 2 pints of mouthwash(Assured Mouth Rinse), daily, last drink was 05/08/12; drank 1 pint of mouthwash. Pt says he's been drinking mouthwash for 2 yrs. Pt has inpt detox hx in 1980's with [facility], no inpt admission since that time was able to remain sober for 6 yrs after attending this facility.  Pt has no legal issues pending at this time.  Pt denies current w/d sxs, however states when he has withdrawals, he is "shaky" and dizzy, has diarrhea and an unsteady gait.  Pt observed with an unsteady gait when initially admitted to psych ed, however pt is better able to manage self and ambulated to the bathroom without incidence.  Pt says he has new onset of substance induced halluc x2 wks stating that sometimes he sees people that aren't there, when he's been drinking.  Pt endorses sadness and depression because of his circumstances.    Axis I: Alcohol Dependence  Axis II: Deferred Axis III:  Past Medical History  Diagnosis Date  . Hypercholesteremia   . Stroke   . Hypertension   . Alcohol abuse   . Seizures   . Depression    Axis IV: housing problems, other psychosocial or environmental problems, problems related to social environment and problems with primary support group Axis V: 51-60 moderate symptoms  Past Medical History:  Past Medical History  Diagnosis Date  . Hypercholesteremia   . Stroke   . Hypertension   . Alcohol abuse   . Seizures   . Depression     No past surgical history on file.  Family History: No family history on file.  Social History:  reports that he has been smoking Cigarettes.  He has been smoking about 0.25 packs per day. He does not have any smokeless tobacco history on file. He reports that  drinks alcohol. He reports that he does not use illicit  drugs.  Additional Social History:  Alcohol / Drug Use Pain Medications: None  Prescriptions: None  Over the Counter: None  History of alcohol / drug use?: Yes Longest period of sobriety (when/how long): None  Withdrawal Symptoms: Other (Comment) (No w/d sxs ) Substance #1 Name of Substance 1: Alcohol--Mouthwash  1 - Age of First Use: 10 YOM  1 - Amount (size/oz): 2 Pints  1 - Frequency: Daily  1 - Duration: On-going  1 - Last Use / Amount: 05/08/12  CIWA: CIWA-Ar BP: 134/65 mmHg Pulse Rate: 106 Nausea and Vomiting: no nausea and no vomiting Tactile Disturbances: none Tremor: no tremor Auditory Disturbances: not present Paroxysmal Sweats: no sweat visible Visual Disturbances: not present Anxiety: no anxiety, at ease Headache, Fullness in Head: none present Agitation: normal activity Orientation and Clouding of Sensorium: oriented and can do serial additions CIWA-Ar Total: 0 COWS:    Allergies: No Known Allergies  Home Medications:  (Not in a hospital admission)  OB/GYN Status:  No LMP for male patient.  General Assessment Data Location of Assessment: [facility] Living Arrangements: Other (Comment);Alone (Homeless ) Can pt return to current living arrangement?: Yes Admission Status: Voluntary Is patient capable of signing voluntary admission?: Yes Transfer from: Acute Hospital Referral Source: MD  Education Status Is patient currently in school?: No Current Grade: None  Highest grade of school patient has completed: None  Name of school: None  Contact person: None   Risk to self Suicidal Ideation: No Suicidal Intent: No Is patient at risk for suicide?: No Suicidal Plan?: No Access to Means: No What has been your use of drugs/alcohol within the last 12 months?: Abusing: Mouthwash  Previous Attempts/Gestures: No How many times?: 0 Other Self Harm Risks: None  Triggers for Past Attempts: None known Intentional Self Injurious Behavior: None Family Suicide  History: No Recent stressful life event(s): Other (Comment);Financial Problems;Recent negative physical changes (Homeless; new onset of neuropathy 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 ) Persecutory voices/beliefs?: No Depression: Yes Depression Symptoms: Loss of interest in usual pleasures Substance abuse history and/or treatment for substance abuse?: Yes Suicide prevention information given to non-admitted patients: Not applicable  Risk to Others Homicidal Ideation: No Thoughts of Harm to Others: No Current Homicidal Intent: No Current Homicidal Plan: No Access to Homicidal Means: No Identified Victim: None  History of harm to others?: No Assessment of Violence: None Noted Violent Behavior Description: None  Does patient have access to weapons?: No Criminal Charges Pending?: No Does patient have a court date: No  Psychosis Hallucinations: None noted Delusions: None noted  Mental Status Report Appear/Hygiene: Disheveled Eye Contact: Good Motor Activity: Unsteady Speech: Logical/coherent Level of Consciousness: Alert Mood: Depressed Affect: Depressed Anxiety Level: None Thought Processes: Coherent;Relevant Judgement: Unimpaired Orientation: Person;Place;Time;Situation Obsessive Compulsive Thoughts/Behaviors: None  Cognitive Functioning Concentration: Normal Memory: Recent Intact;Remote Intact IQ: Average Insight: Fair Impulse Control: Fair Appetite: Good Weight Loss: 0 Weight Gain: 0 Sleep: No Change Total Hours of Sleep: 6 Vegetative Symptoms: None  ADLScreening [facility]) Patient's cognitive ability adequate to safely complete daily activities?: Yes Patient able to express need for assistance with ADLs?: Yes Independently performs ADLs?: Yes (appropriate for developmental age)  Abuse/Neglect [facility]) Physical Abuse: Denies Verbal Abuse: Denies Sexual Abuse: Denies  Prior Inpatient Therapy Prior Inpatient Therapy: Yes Prior Therapy Dates: 1980's Prior Therapy  Facilty/Provider(s): [facility]  Reason for Treatment: Detox/Rehab   Prior Outpatient Therapy Prior Outpatient Therapy: No Prior Therapy Dates: None  Prior Therapy Facilty/Provider(s): None  Reason for Treatment: None   ADL Screening (condition at time of admission) Patient's cognitive ability adequate to safely complete daily activities?: Yes Patient able to express need for assistance with ADLs?: Yes Independently performs ADLs?: Yes (appropriate for developmental age) Weakness of Legs: Both (Unsteady gait when drinking ) Weakness of Arms/Hands: None  Home Assistive Devices/Equipment Home Assistive Devices/Equipment: None  Therapy Consults (therapy consults require a physician order) PT Evaluation Needed: No OT Evalulation Needed: No SLP Evaluation Needed: No Abuse/Neglect Assessment (Assessment to be complete while patient is alone) Physical Abuse: Denies Verbal Abuse: Denies Sexual Abuse: Denies Exploitation of patient/patient's resources: Denies Self-Neglect: Denies Values / Beliefs Cultural Requests During Hospitalization: None Spiritual Requests During Hospitalization: None Consults Spiritual Care Consult Needed: No Social Work Consult Needed: No Merchant navy officer (For Healthcare) Advance Directive: Patient does not have advance directive;Patient would not like information Pre-existing out of facility DNR order (yellow form or pink MOST form): No Nutrition Screen- [facility] Patient's home diet: Regular Have you recently lost weight without trying?: No Have you been eating poorly because of a decreased appetite?: No Malnutrition Screening Tool Score: 0  Additional Information 1:1 In Past 12 Months?: No CIRT Risk: No Elopement Risk: No Does patient have medical clearance?: Yes     Disposition:  Disposition Disposition of Patient: Inpatient treatment program;Referred to ([facility] ) Type of inpatient treatment program: Adult Patient referred to:  [facility]  On  Site Evaluation by:   Reviewed with Physician:     Murrell Redden 05/09/2012 12:54 AM

## 2012-05-09 NOTE — ED Provider Notes (Addendum)
Filed Vitals:   05/09/12 0830  BP: 157/87  Pulse: 91  Temp:   Resp:    Patient with a history of alcohol abuse. Requesting detox. No suicidal or homicidal ideation. Will attempt to place her detox.  Raeford Razor, MD 05/09/12 1005  Patient has been accepted at [facility]. Patient has been medically cleared for discharge.  Raeford Razor, MD 05/09/12 1010

## 2012-05-09 NOTE — ED Notes (Signed)
1 safari bag, 3 personal belonging bags,1 garbage bag in locker 43

## 2012-05-09 NOTE — BHH Counselor (Signed)
Patient has been accepted at [facility] by Misty Stanley. Transportation to arrive @ 1100.

## 2012-05-11 ENCOUNTER — Encounter (HOSPITAL_COMMUNITY): Payer: Self-pay | Admitting: *Deleted

## 2012-05-11 ENCOUNTER — Emergency Department (HOSPITAL_COMMUNITY)
Admission: EM | Admit: 2012-05-11 | Discharge: 2012-05-11 | Disposition: A | Discharge status: Home / Self Care | Payer: Self-pay | Attending: Emergency Medicine | Admitting: Emergency Medicine

## 2012-05-11 DIAGNOSIS — Z8673 Personal history of transient ischemic attack (TIA), and cerebral infarction without residual deficits: Secondary | ICD-10-CM | POA: Insufficient documentation

## 2012-05-11 DIAGNOSIS — I10 Essential (primary) hypertension: Secondary | ICD-10-CM | POA: Insufficient documentation

## 2012-05-11 DIAGNOSIS — Z8669 Personal history of other diseases of the nervous system and sense organs: Secondary | ICD-10-CM | POA: Insufficient documentation

## 2012-05-11 DIAGNOSIS — Z8659 Personal history of other mental and behavioral disorders: Secondary | ICD-10-CM | POA: Insufficient documentation

## 2012-05-11 DIAGNOSIS — Z7902 Long term (current) use of antithrombotics/antiplatelets: Secondary | ICD-10-CM | POA: Insufficient documentation

## 2012-05-11 DIAGNOSIS — Z79899 Other long term (current) drug therapy: Secondary | ICD-10-CM | POA: Insufficient documentation

## 2012-05-11 DIAGNOSIS — E7800 Pure hypercholesterolemia, unspecified: Secondary | ICD-10-CM | POA: Insufficient documentation

## 2012-05-11 DIAGNOSIS — F1010 Alcohol abuse, uncomplicated: Secondary | ICD-10-CM | POA: Insufficient documentation

## 2012-05-11 DIAGNOSIS — F17200 Nicotine dependence, unspecified, uncomplicated: Secondary | ICD-10-CM | POA: Insufficient documentation

## 2012-05-11 NOTE — ED Provider Notes (Signed)
History     CSN: [id number]  Arrival date & time 05/11/12  1714   First MD Initiated Contact with Patient 05/11/12 1750      Chief Complaint  Patient presents with  . Alcohol Intoxication    (Consider location/radiation/quality/duration/timing/severity/associated sxs/prior treatment) Patient is a 62 y.o. male presenting with intoxication. The history is provided by the patient.  Alcohol Intoxication   Patient here after being brought in by EMS to 2 public intoxication with alcohol. Patient admits to drinking 2 bottles of mouthwash today. Patient just was released from alcohol rehabilitation 2 days ago. He admits that he does not want to stop drinking at this time. He denies any suicidal or homicidal ideations. No abdominal pain or chest pain. Does note a six-month history of lower extremity neuropathy which is been unchanged. Patient denies any black or bloody stools. Patient is homeless Past Medical History  Diagnosis Date  . Hypercholesteremia   . Stroke   . Hypertension   . Alcohol abuse   . Seizures   . Depression     History reviewed. No pertinent past surgical history.  No family history on file.  History  Substance Use Topics  . Smoking status: Current Every Day Smoker -- 0.25 packs/day    Types: Cigarettes  . Smokeless tobacco: Not on file  . Alcohol Use: Yes     Comment: drinks bottles of 2 pint  mouthwash daily      Review of Systems  All other systems reviewed and are negative.    Allergies  Review of patient's allergies indicates no known allergies.  Home Medications   Current Outpatient Rx  Name  Route  Sig  Dispense  Refill  . amLODipine (NORVASC) 10 MG tablet   Oral   Take 1 tablet (10 mg total) by mouth daily.   30 tablet   4   . clopidogrel (PLAVIX) 75 MG tablet   Oral   Take 75 mg by mouth daily with breakfast.         . lisinopril (PRINIVIL,ZESTRIL) 5 MG tablet   Oral   Take 5 mg by mouth daily.         . Multiple Vitamin  (MULTIVITAMIN WITH MINERALS) TABS   Oral   Take 1 tablet by mouth daily.           BP 148/78  Temp(Src) 98.6 F (37 C) (Oral)  Resp 18  SpO2 100%  Physical Exam  Nursing note and vitals reviewed. Constitutional: He is oriented to person, place, and time. He appears well-developed and well-nourished.  Non-toxic appearance. No distress.  HENT:  Head: Normocephalic and atraumatic.  Eyes: Conjunctivae, EOM and lids are normal. Pupils are equal, round, and reactive to light.  Neck: Normal range of motion. Neck supple. No tracheal deviation present. No mass present.  Cardiovascular: Normal rate, regular rhythm and normal heart sounds.  Exam reveals no gallop.   No murmur heard. Pulmonary/Chest: Effort normal and breath sounds normal. No stridor. No respiratory distress. He has no decreased breath sounds. He has no wheezes. He has no rhonchi. He has no rales.  Abdominal: Soft. Normal appearance and bowel sounds are normal. He exhibits no distension. There is no tenderness. There is no rebound and no CVA tenderness.  Musculoskeletal: Normal range of motion. He exhibits no edema and no tenderness.  Neurological: He is alert and oriented to person, place, and time. He has normal strength. No cranial nerve deficit or sensory deficit. GCS eye subscore is 4.  GCS verbal subscore is 5. GCS motor subscore is 6.  Skin: Skin is warm and dry. No abrasion and no rash noted.  Psychiatric: His speech is normal. His affect is blunt. He is slowed.    ED Course  Procedures (including critical care time)  Labs Reviewed - No data to display No results found.   No diagnosis found.    MDM   8:35 PM Patient is clinically sober at this time and is stable for discharge.          Toy Baker, MD 05/11/12 2035

## 2012-05-11 NOTE — ED Notes (Signed)
[id number]<AV> Expected date:05/11/12<BR> Expected time: 5:15 PM<BR> Means of arrival:<BR> Comments:<BR> ETOH chest pain

## 2012-05-11 NOTE — Discharge Summary (Signed)
Please note the discharge date was 04/29/2012 NOT 05/07/2012 as originally documented.  The attending of record was Rocco Serene, MD

## 2012-05-11 NOTE — ED Notes (Signed)
MD at bedside. 

## 2012-05-11 NOTE — ED Notes (Addendum)
Per EMS - [place] with [place].  Pt complained to EMS that he had chest pain.  EKG unremarkable.  Pt is apparently intoxicated on mouth wash and admits to drinking 2 qts of mouth wash today.  Vitals on scene WNL, 113/65, HR 94.  CBG 77.  Pt denies SI/HI.

## 2012-05-15 ENCOUNTER — Encounter (HOSPITAL_COMMUNITY): Payer: Self-pay

## 2012-05-15 ENCOUNTER — Emergency Department (HOSPITAL_COMMUNITY)
Admission: EM | Admit: 2012-05-15 | Discharge: 2012-05-15 | Disposition: A | Discharge status: Home / Self Care | Payer: Self-pay | Attending: Emergency Medicine | Admitting: Emergency Medicine

## 2012-05-15 DIAGNOSIS — G589 Mononeuropathy, unspecified: Secondary | ICD-10-CM | POA: Insufficient documentation

## 2012-05-15 DIAGNOSIS — Z7902 Long term (current) use of antithrombotics/antiplatelets: Secondary | ICD-10-CM | POA: Insufficient documentation

## 2012-05-15 DIAGNOSIS — Z79899 Other long term (current) drug therapy: Secondary | ICD-10-CM | POA: Insufficient documentation

## 2012-05-15 DIAGNOSIS — Z8659 Personal history of other mental and behavioral disorders: Secondary | ICD-10-CM | POA: Insufficient documentation

## 2012-05-15 DIAGNOSIS — Z8673 Personal history of transient ischemic attack (TIA), and cerebral infarction without residual deficits: Secondary | ICD-10-CM | POA: Insufficient documentation

## 2012-05-15 DIAGNOSIS — Z8669 Personal history of other diseases of the nervous system and sense organs: Secondary | ICD-10-CM | POA: Insufficient documentation

## 2012-05-15 DIAGNOSIS — Z862 Personal history of diseases of the blood and blood-forming organs and certain disorders involving the immune mechanism: Secondary | ICD-10-CM | POA: Insufficient documentation

## 2012-05-15 DIAGNOSIS — Z5900 Homelessness unspecified: Secondary | ICD-10-CM | POA: Insufficient documentation

## 2012-05-15 DIAGNOSIS — I10 Essential (primary) hypertension: Secondary | ICD-10-CM | POA: Insufficient documentation

## 2012-05-15 DIAGNOSIS — F1010 Alcohol abuse, uncomplicated: Secondary | ICD-10-CM | POA: Insufficient documentation

## 2012-05-15 DIAGNOSIS — Z8639 Personal history of other endocrine, nutritional and metabolic disease: Secondary | ICD-10-CM | POA: Insufficient documentation

## 2012-05-15 DIAGNOSIS — G629 Polyneuropathy, unspecified: Secondary | ICD-10-CM

## 2012-05-15 DIAGNOSIS — F17200 Nicotine dependence, unspecified, uncomplicated: Secondary | ICD-10-CM | POA: Insufficient documentation

## 2012-05-15 MED ORDER — LORAZEPAM 1 MG PO TABS
1.0000 mg | ORAL_TABLET | Freq: Once | ORAL | Status: AC
  Administered 2012-05-15: 1 mg via ORAL
  Filled 2012-05-15: qty 1

## 2012-05-15 NOTE — ED Notes (Signed)
Per EMS- Patient is homeless and ws picked up at [employer]. Patient has been drinking mouthwash. Patient stated that he only wanted to be treated for neuropathy of his hands and feet.

## 2012-05-15 NOTE — ED Provider Notes (Signed)
History  This chart was scribed for non-physician practitioner working with Ward Givens, MD, by Candelaria Stagers, ED Scribe. This patient was seen in room [facility]TR1/[facility] and the patient's care was started at 6:34 PM   CSN: [id number]  Arrival date & time 05/15/12  3086   First MD Initiated Contact with Patient 05/15/12 1833      Chief Complaint  Patient presents with  . neuropathy pain     The history is provided by the patient. The history is limited by the condition of the patient. No language interpreter was used.   [name] [name] is a 62 y.o. 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  Pt has h/o alcoholism drinking mouthwash. He reports he drank 1 pint of mouthwash today.  Pt reports he has been homeless for the last eight years.  Pt states he is agitated that the hospital has not helped him, but cannot clearly state what they have not helped him with.  He denies drug use.   Past Medical History  Diagnosis Date  . Hypercholesteremia   . Stroke   . Hypertension   . Alcohol abuse   . Seizures   . Depression     No past surgical history on file.  No family history on file.  History  Substance Use Topics  . Smoking status: Current Every Day Smoker -- 0.25 packs/day    Types: Cigarettes  . Smokeless tobacco: Not on file  . Alcohol Use: Yes     Comment: drinks bottles of 2 pint  mouthwash daily      Review of Systems  Unable to perform ROS: Other    Allergies  Review of patient's allergies indicates no known allergies.  Home Medications   Current Outpatient Rx  Name  Route  Sig  Dispense  Refill  . amLODipine (NORVASC) 10 MG tablet   Oral   Take 1 tablet (10 mg total) by mouth daily.   30 tablet   4   . clopidogrel (PLAVIX) 75 MG tablet   Oral   Take 75 mg by mouth daily with breakfast.         . lisinopril (PRINIVIL,ZESTRIL) 5 MG tablet   Oral   Take 5 mg by mouth  daily.         . Multiple Vitamin (MULTIVITAMIN WITH MINERALS) TABS   Oral   Take 1 tablet by mouth daily.           There were no vitals taken for this visit.  Physical Exam  Nursing note and vitals reviewed. Constitutional: He is oriented to person, place, and time. He appears well-developed. No distress.  Shaky, smells of mouthwash, unkempt.  HENT:  Head: Normocephalic and atraumatic.  Eyes: Conjunctivae and EOM are normal. Pupils are equal, round, and reactive to light.  Neck: Normal range of motion. Neck supple. No tracheal deviation present.  Cardiovascular: Normal rate, regular rhythm and normal heart sounds.   Tachycardic.   Pulmonary/Chest: Effort normal and breath sounds normal. No respiratory distress.  Musculoskeletal: Normal range of motion. He exhibits no edema.  Neurological: He is alert and oriented to person, place, and time. He has normal strength. No sensory deficit. Gait normal.  Skin: Skin is warm and dry.  Psychiatric: His speech is tangential. He is agitated. He is not aggressive.    ED Course  Procedures   DIAGNOSTIC STUDIES:   COORDINATION OF CARE:  6:39 PM Discussed course of care with pt which includes medication for neuropathy.  Pt understands and agrees.     Labs Reviewed - No data to display No results found.   1. Alcohol abuse   2. Neuropathy       MDM  Resource guide given for outpatient follow up. Patient requesting a meal. No SI/HI. Clinically sober.  I personally performed the services described in this documentation, which was scribed in my presence. The recorded information has been reviewed and is accurate.        Trevor Mace, PA-C 05/15/12 1847  Trevor Mace, PA-C 05/15/12 [phone]

## 2012-05-15 NOTE — ED Provider Notes (Signed)
Medical screening examination/treatment/procedure(s) were performed by non-physician practitioner and as supervising physician I was immediately available for consultation/collaboration. Devoria Albe, MD, FACEP   Ward Givens, MD 05/15/12 [phone]

## 2012-05-18 ENCOUNTER — Emergency Department (HOSPITAL_COMMUNITY)
Admission: EM | Admit: 2012-05-18 | Discharge: 2012-05-25 | Payer: Self-pay | Attending: Emergency Medicine | Admitting: Emergency Medicine

## 2012-05-18 ENCOUNTER — Encounter (HOSPITAL_COMMUNITY): Payer: Self-pay | Admitting: *Deleted

## 2012-05-18 DIAGNOSIS — Z862 Personal history of diseases of the blood and blood-forming organs and certain disorders involving the immune mechanism: Secondary | ICD-10-CM | POA: Insufficient documentation

## 2012-05-18 DIAGNOSIS — Z7901 Long term (current) use of anticoagulants: Secondary | ICD-10-CM | POA: Insufficient documentation

## 2012-05-18 DIAGNOSIS — R209 Unspecified disturbances of skin sensation: Secondary | ICD-10-CM | POA: Insufficient documentation

## 2012-05-18 DIAGNOSIS — Z79899 Other long term (current) drug therapy: Secondary | ICD-10-CM | POA: Insufficient documentation

## 2012-05-18 DIAGNOSIS — Z5900 Homelessness unspecified: Secondary | ICD-10-CM | POA: Insufficient documentation

## 2012-05-18 DIAGNOSIS — R531 Weakness: Secondary | ICD-10-CM

## 2012-05-18 DIAGNOSIS — Z8673 Personal history of transient ischemic attack (TIA), and cerebral infarction without residual deficits: Secondary | ICD-10-CM | POA: Insufficient documentation

## 2012-05-18 DIAGNOSIS — Z8639 Personal history of other endocrine, nutritional and metabolic disease: Secondary | ICD-10-CM | POA: Insufficient documentation

## 2012-05-18 DIAGNOSIS — F1010 Alcohol abuse, uncomplicated: Secondary | ICD-10-CM

## 2012-05-18 DIAGNOSIS — F17200 Nicotine dependence, unspecified, uncomplicated: Secondary | ICD-10-CM | POA: Insufficient documentation

## 2012-05-18 DIAGNOSIS — Z8669 Personal history of other diseases of the nervous system and sense organs: Secondary | ICD-10-CM | POA: Insufficient documentation

## 2012-05-18 DIAGNOSIS — G629 Polyneuropathy, unspecified: Secondary | ICD-10-CM

## 2012-05-18 DIAGNOSIS — I10 Essential (primary) hypertension: Secondary | ICD-10-CM | POA: Insufficient documentation

## 2012-05-18 DIAGNOSIS — R4182 Altered mental status, unspecified: Secondary | ICD-10-CM

## 2012-05-18 DIAGNOSIS — Z8659 Personal history of other mental and behavioral disorders: Secondary | ICD-10-CM | POA: Insufficient documentation

## 2012-05-18 DIAGNOSIS — F1020 Alcohol dependence, uncomplicated: Secondary | ICD-10-CM

## 2012-05-18 DIAGNOSIS — G609 Hereditary and idiopathic neuropathy, unspecified: Secondary | ICD-10-CM | POA: Insufficient documentation

## 2012-05-18 MED ORDER — GABAPENTIN 100 MG PO CAPS: 100.0000 mg | ORAL_CAPSULE | Freq: Three times a day (TID) | ORAL | Status: DC

## 2012-05-18 NOTE — ED Notes (Signed)
Pt reports bilateral foot pain, left arm [pain and numbness, facial numbness and tingling. He states, "I am an drinker.. I drink mouthwash. The neuropathy is from drinking I think. I can barely walk now. [place] the [address] and have to take bus places. [facility] will not take me." pt is alert and oriented, last drink this AM. Left great toenail injury noted.

## 2012-05-18 NOTE — ED Notes (Signed)
Pt states his neuropathy is causing him pain. States pain in bilateral feet.

## 2012-05-18 NOTE — ED Notes (Addendum)
Pt requesting detox, EDP informed

## 2012-05-18 NOTE — ED Notes (Signed)
Pt states last drink was mouthwash this morning. About 3 oz.

## 2012-05-18 NOTE — ED Provider Notes (Signed)
History     CSN: [id number]  Arrival date & time 05/18/12  1903   First MD Initiated Contact with Patient 05/18/12 2118      Chief Complaint  Patient presents with  . Foot Pain   HPI 62 year old male who presents to the emergency department with worsening of his neuropathy. Reports that this is bilateral tingling sensation in his feet to travel up towards his thighs. Reports that he is still able to feel this. No acute worsening and more of a chronic disease that he isn't been unable to get relief from. Also feels these in his fingertips as well. He is seen other physicians here at the emergency department for this but has been unable to fill the medication secondary to cost. Was seen today at the [facility] and while at the [facility] he complained of this tingling sensation and they referred him hear for further evaluation.  Patient also complaining of desire to stop drinking. Patient reports is chronic alcoholic and drinks sometimes or 2 bottles of mouthwash per day.  Past Medical History  Diagnosis Date  . Hypercholesteremia   . Stroke   . Hypertension   . Alcohol abuse   . Seizures   . Depression     No past surgical history on file.  Family History: Reviewed.  None pertinent.     History  Substance Use Topics  . Smoking status: Current Every Day Smoker -- 0.25 packs/day    Types: Cigarettes  . Smokeless tobacco: Not on file  . Alcohol Use: Yes     Comment: drinks bottles of 2 pint  mouthwash daily      Review of Systems  Constitutional: Negative for fever and chills.  HENT: Negative for congestion, sore throat and neck pain.   Respiratory: Negative for cough.   Gastrointestinal: Negative for nausea, vomiting, abdominal pain, diarrhea and constipation.  Endocrine: Negative for polyuria.  Genitourinary: Negative for dysuria and hematuria.  Skin: Negative for rash.  Neurological: Negative for headaches.  Psychiatric/Behavioral: Negative.   All  other systems reviewed and are negative.    Allergies  Review of patient's allergies indicates no known allergies.  Home Medications   Current Outpatient Rx  Name  Route  Sig  Dispense  Refill  . amLODipine (NORVASC) 10 MG tablet   Oral   Take 1 tablet (10 mg total) by mouth daily.   30 tablet   4   . clopidogrel (PLAVIX) 75 MG tablet   Oral   Take 75 mg by mouth daily with breakfast.         . lisinopril (PRINIVIL,ZESTRIL) 5 MG tablet   Oral   Take 5 mg by mouth daily.         . Multiple Vitamin (MULTIVITAMIN WITH MINERALS) TABS   Oral   Take 1 tablet by mouth daily.           BP 152/98  Pulse 97  Temp(Src) 98 F (36.7 C)  Resp 20  SpO2 97%  Physical Exam  Nursing note and vitals reviewed. Constitutional: He is oriented to person, place, and time. He appears well-developed and well-nourished. No distress.  HENT:  Head: Normocephalic and atraumatic.  Right Ear: External ear normal.  Left Ear: External ear normal.  Mouth/Throat: Oropharynx is clear and moist. No oropharyngeal exudate.  Eyes: Conjunctivae are normal. Pupils are equal, round, and reactive to light. Right eye exhibits no discharge.  Neck: Normal range of motion. Neck supple. No tracheal deviation present.  Cardiovascular: Normal rate, regular rhythm and intact distal pulses.   Pulmonary/Chest: Effort normal. No respiratory distress. He has no wheezes. He has no rales.  Abdominal: Soft. He exhibits no distension. There is no tenderness. There is no rebound and no guarding.  Musculoskeletal: Normal range of motion.  Neurological: He is alert and oriented to person, place, and time.  Skin: Skin is warm and dry. No rash noted. He is not diaphoretic.  Psychiatric: He has a normal mood and affect.    ED Course  Procedures (including critical care time)  Labs Reviewed - No data to display No results found.   Date: 05/18/2012  Rate: 98  Rhythm: normal sinus rhythm  QRS Axis: normal   Intervals: normal  ST/T Wave abnormalities: normal  Conduction Disutrbances:none  Narrative Interpretation: Normal EKG. Unchanged from previous.  Old EKG Reviewed: unchanged    1. Neuropathy       MDM   Patient is a 62 year old male with history of homelessness and peripheral neuropathy presenting to the emergency department with worsening of his neuropathy and desire to stop drinking alcohol. No acute concerns with neuropathy. Initial plan was to treat neuropathy with gabapentin. However, patient unable to afford gabapentin and expressed a strong desire to stop drinking alcohol. Act team consulted. Labs pending otherwise patient medically clear at this time. Patient IVCed at this time. Patient can leave at any time.        Arloa Koh, MD 05/19/12 [phone]

## 2012-05-19 LAB — ACETAMINOPHEN LEVEL: Acetaminophen (Tylenol), Serum: <15 ug/mL (ref 10–30)

## 2012-05-19 LAB — RAPID URINE DRUG SCREEN, HOSP PERFORMED
Barbiturates: NOT DETECTED
Cocaine: NOT DETECTED
Opiates: NOT DETECTED

## 2012-05-19 LAB — CBC WITH DIFFERENTIAL/PLATELET
Eosinophils Relative: 1 % (ref 0–5)
HCT: 37.2 % — ABNORMAL LOW (ref 39.0–52.0)
Hemoglobin: 12.9 g/dL — ABNORMAL LOW (ref 13.0–17.0)
Lymphocytes Relative: 20 % (ref 12–46)
Lymphs Abs: 1.1 10*3/uL (ref 0.7–4.0)
MCV: 105.7 fL — ABNORMAL HIGH (ref 78.0–100.0)
Monocytes Absolute: 0.4 10*3/uL (ref 0.1–1.0)
Neutro Abs: 3.9 10*3/uL (ref 1.7–7.7)
RBC: 3.52 MIL/uL — ABNORMAL LOW (ref 4.22–5.81)
WBC: 5.4 10*3/uL (ref 4.0–10.5)

## 2012-05-19 LAB — COMPREHENSIVE METABOLIC PANEL
ALT: 22 U/L (ref 0–53)
CO2: 26 mEq/L (ref 19–32)
Calcium: 8.8 mg/dL (ref 8.4–10.5)
Chloride: 99 mEq/L (ref 96–112)
Creatinine, Ser: 0.52 mg/dL (ref 0.50–1.35)
GFR calc Af Amer: >90 mL/min (ref >90)
GFR calc non Af Amer: >90 mL/min (ref >90)
Glucose, Bld: 80 mg/dL (ref 70–99)
Total Bilirubin: 0.5 mg/dL (ref 0.3–1.2)

## 2012-05-19 LAB — POTASSIUM: Potassium: 3.8 mEq/L (ref 3.5–5.1)

## 2012-05-19 LAB — MAGNESIUM: Magnesium: 1.9 mg/dL (ref 1.5–2.5)

## 2012-05-19 MED ORDER — GABAPENTIN 100 MG PO CAPS
100.0000 mg | ORAL_CAPSULE | Freq: Once | ORAL | Status: AC
  Administered 2012-05-19: 100 mg via ORAL
  Filled 2012-05-19: qty 1

## 2012-05-19 MED ORDER — ADULT MULTIVITAMIN W/MINERALS CH
1.0000 | ORAL_TABLET | Freq: Every day | ORAL | Status: DC
  Administered 2012-05-19 – 2012-05-25 (×7): 1 via ORAL
  Filled 2012-05-19 (×7): qty 1

## 2012-05-19 MED ORDER — MAGNESIUM SULFATE 40 MG/ML IJ SOLN: 2.0000 g | Freq: Once | INTRAMUSCULAR | Status: DC

## 2012-05-19 MED ORDER — IBUPROFEN 200 MG PO TABS
600.0000 mg | ORAL_TABLET | Freq: Three times a day (TID) | ORAL | Status: DC | PRN
  Administered 2012-05-23 – 2012-05-25 (×3): 600 mg via ORAL
  Filled 2012-05-19 (×3): qty 3

## 2012-05-19 MED ORDER — ALUM & MAG HYDROXIDE-SIMETH 200-200-20 MG/5ML PO SUSP: 30.0000 mL | ORAL | Status: DC | PRN

## 2012-05-19 MED ORDER — LORAZEPAM 2 MG/ML IJ SOLN: 1.0000 mg | Freq: Four times a day (QID) | INTRAMUSCULAR | Status: AC | PRN

## 2012-05-19 MED ORDER — LISINOPRIL 10 MG PO TABS
5.0000 mg | ORAL_TABLET | Freq: Every day | ORAL | Status: DC
  Administered 2012-05-19 – 2012-05-25 (×7): 5 mg via ORAL
  Filled 2012-05-19 (×7): qty 1

## 2012-05-19 MED ORDER — AMLODIPINE BESYLATE 10 MG PO TABS
10.0000 mg | ORAL_TABLET | Freq: Every day | ORAL | Status: DC
  Administered 2012-05-19 – 2012-05-24 (×6): 10 mg via ORAL
  Filled 2012-05-19 (×7): qty 1

## 2012-05-19 MED ORDER — LORAZEPAM 1 MG PO TABS
1.0000 mg | ORAL_TABLET | Freq: Four times a day (QID) | ORAL | Status: AC | PRN
  Administered 2012-05-20 – 2012-05-21 (×4): 1 mg via ORAL
  Filled 2012-05-19 (×5): qty 1

## 2012-05-19 MED ORDER — THIAMINE HCL 100 MG/ML IJ SOLN: 100.0000 mg | Freq: Every day | INTRAMUSCULAR | Status: DC

## 2012-05-19 MED ORDER — POTASSIUM CHLORIDE CRYS ER 20 MEQ PO TBCR
40.0000 meq | EXTENDED_RELEASE_TABLET | Freq: Once | ORAL | Status: AC
  Administered 2012-05-19: 40 meq via ORAL
  Filled 2012-05-19: qty 2

## 2012-05-19 MED ORDER — NICOTINE 21 MG/24HR TD PT24
21.0000 mg | MEDICATED_PATCH | Freq: Every day | TRANSDERMAL | Status: DC
  Filled 2012-05-19 (×2): qty 1

## 2012-05-19 MED ORDER — ONDANSETRON HCL 8 MG PO TABS: 4.0000 mg | ORAL_TABLET | Freq: Three times a day (TID) | ORAL | Status: DC | PRN

## 2012-05-19 MED ORDER — FOLIC ACID 1 MG PO TABS
1.0000 mg | ORAL_TABLET | Freq: Every day | ORAL | Status: DC
  Administered 2012-05-19 – 2012-05-25 (×7): 1 mg via ORAL
  Filled 2012-05-19 (×7): qty 1

## 2012-05-19 MED ORDER — CLOPIDOGREL BISULFATE 75 MG PO TABS
75.0000 mg | ORAL_TABLET | Freq: Every day | ORAL | Status: DC
  Administered 2012-05-19 – 2012-05-25 (×7): 75 mg via ORAL
  Filled 2012-05-19 (×7): qty 1

## 2012-05-19 MED ORDER — VITAMIN B-1 100 MG PO TABS
100.0000 mg | ORAL_TABLET | Freq: Every day | ORAL | Status: DC
  Administered 2012-05-19 – 2012-05-25 (×7): 100 mg via ORAL
  Filled 2012-05-19 (×7): qty 1

## 2012-05-19 NOTE — ED Provider Notes (Signed)
I saw and evaluated the patient, reviewed the resident's note and I agree with the findings and plan.   .Face to face Exam:  General:  Awake HEENT:  Atraumatic Resp:  Normal effort Abd:  Nondistended Neuro:No focal weakness Lymph: No adenopathy  Robert L Beaton, MD 05/19/12 2337 

## 2012-05-19 NOTE — BH Assessment (Addendum)
[facility] Assessment Progress Note      Update:  Called [facility], possible beds per [facility] @ 1810.  Called [facility], left message @ 1813.  [facility], beds available per [facility] @ [phone].  Called [facility] and beds available per [facility] @ 1816.  Called [facility] and beds available per [facility] @ 1816.  Called [facility] and beds avaialble per [facility] @ [phone].  Referral faxed to [facility], [facility], and [facility] for review.

## 2012-05-19 NOTE — ED Notes (Signed)
Pt. Ambulated to the bathroom with assist of 2, pt. Is unsteady  On his feet.

## 2012-05-19 NOTE — ED Provider Notes (Signed)
Assuming care of patient this morning. Patient in the ED for detox.  Awaiting placement. Patient had no complains, no concerns from the nursing side. Will continue to monitor. Vitals are stable. Potassium replaced, will recheck levels later if requested by accepting facility.   Derwood Kaplan, MD 05/19/12 [phone]
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
## 2012-05-19 NOTE — BH Assessment (Signed)
[facility] Assessment Progress Note      05/19/12 0230.  [facility] declines pt due to medical accuity. Daleen Squibb, LCSWA

## 2012-05-19 NOTE — ED Notes (Signed)
Act team speaking with pt.

## 2012-05-19 NOTE — ED Notes (Signed)
All belongings bagged and pt placed in paper scrubs.

## 2012-05-19 NOTE — BH Assessment (Signed)
[facility] Assessment Progress Note      05/19/12.  [facility] full.  [facility] declined pt. Daleen Squibb, LCSWA

## 2012-05-19 NOTE — Treatment Plan (Signed)
Pt has been declined admission to [facility] by Verne Spurr, PA secondary to his medical acuity (new onset seizure disorder, inability to ambulate safely in our mileu).  The safest detox for this pt would on a medical floor.

## 2012-05-20 NOTE — BH Assessment (Signed)
[facility] Assessment Progress Note   Clinician called Victorino Dike at [facility] at 06:10.  They have no male beds available at this time.  Called Maurina at [facility] at 06:40 and she had not had the opportunity to review patient information sent to her.  Patient will be at the 3 day mark on his detox at 11am on Wednesday, 03/05.  Nurse noted no observable seizure activity during the evening.

## 2012-05-20 NOTE — ED Notes (Signed)
BREAKFAST ORDERED FOR PT. PT GIVEN A WALKER TO USE FOR AMBULATION. HE STATES HE HAS NEUROPATHY IN HIS FEET AND DOESN'T WALK WELL UNLESS HE HAS AN ASSISTIVE DEVICE.

## 2012-05-20 NOTE — BH Assessment (Addendum)
Assessment Note   [name] [name] is an 62 y.o. male that was reassessed this day.  Pt denies SI/HI or psychosis.  Pt reports he still wants detox from alcohol.  Pt reports more anxiety today in reference to withdrawal sx.  Pt has been declined at [facility] and [facility] for medical acuity.  Called [facility] and no beds per [facility] @ 1219.  Called [facility] @ [phone], no beds per Okey Regal.  Called [facility] and per [place], pt still pending there and referral under review @ 1142.  Called [facility] and pt still under review per Jasmine December @ 1143.  Called [facility], left message @ 1144 and Okey Regal called back stating beds available and referral faxed @ for review.  Called [facility] and [place] stated so fax referral for review @ 1220.  Received call back for additional information and this was also faxed to [facility] for review.  Completed reassessment, assessment notification, and faced to [facility] to log.  Pt under review at [facility], [facility], and [facility].  Updated ED staff.  Previous Note:  [name] [name] is an 62 y.o. male. Pt requests detox from alcohol. Pt has been drinking pint containers of mouthwash, 2 pints per day for past 6 months. Last use this morning at 11am. Pt does report withdrawal symptoms of shakes, sweats, and anxiety. Pt vitals are elevated and pt does appear to be withdrawing at this time. Pt also reports a history of siezures but could not tell the date of last siezure. Pt reports he was recently told during an ED visit that his electrolytes were all off due to his drinking and he needed to stop or he could die. Pt does report some depression and states he is homeless currently. Pt reports he has had some thoughts of "what is the use of living" as recently as yesterday but denies current SI/HI/AV.    Axis I: 303.90 Alcohol Dependence Axis II: Deferred Axis III:  Past Medical History  Diagnosis Date  . Hypercholesteremia   . Stroke   . Hypertension   . Alcohol abuse   . Seizures   . Depression    Axis IV: economic  problems, housing problems, occupational problems, other psychosocial or environmental problems, problems related to social environment, problems with access to health care services and problems with primary support group Axis V: 31-40 impairment in reality testing  Past Medical History:  Past Medical History  Diagnosis Date  . Hypercholesteremia   . Stroke   . Hypertension   . Alcohol abuse   . Seizures   . Depression     No past surgical history on file.  Family History: No family history on file.  Social History:  reports that he has been smoking Cigarettes.  He has been smoking about 0.25 packs per day. He does not have any smokeless tobacco history on file. He reports that  drinks alcohol. He reports that he does not use illicit drugs.  Additional Social History:  Alcohol / Drug Use Pain Medications: Pt denies Prescriptions: Pt denies Over the Counter: Pt currently drinking mouthwash History of alcohol / drug use?: Yes Longest period of sobriety (when/how long): None  Negative Consequences of Use: Financial;Legal;Personal relationships Withdrawal Symptoms: Blackouts;Seizures Onset of Seizures: pt could not say Date of most recent seizure: unknown Substance #1 Name of Substance 1: alcohol/mouthwash 1 - Age of First Use: 10 1 - Amount (size/oz): 2 pints 1 - Frequency: daily 1 - Duration: 6 months 1 - Last Use / Amount: 3/2, 3 oz mouthwash  CIWA: CIWA-Ar BP: 131/89 mmHg Pulse Rate: 86 Nausea and Vomiting: no nausea and no vomiting Tactile Disturbances: mild itching, pins and needles, burning or numbness Tremor: two Auditory Disturbances: not present Paroxysmal Sweats: two Visual Disturbances: not present Anxiety: two Headache, Fullness in Head: none present Agitation: normal activity Orientation and Clouding of Sensorium: oriented and can do serial additions CIWA-Ar Total: 8 COWS:    Allergies: No Known Allergies  Home Medications:  (Not in a hospital  admission)  OB/GYN Status:  No LMP for male patient.  General Assessment Data Location of Assessment: [facility] ED ACT Assessment: Yes Living Arrangements: Other (Comment) (homeless) Can pt return to current living arrangement?: Yes Admission Status: Voluntary Is patient capable of signing voluntary admission?: Yes Transfer from: Acute Hospital Referral Source: Self/Family/Friend  Education Status Is patient currently in school?: No  Risk to self Suicidal Ideation: No Suicidal Intent: No Is patient at risk for suicide?: No Suicidal Plan?: No Access to Means: No What has been your use of drugs/alcohol within the last 12 months?: Dsily use of alcohol Previous Attempts/Gestures: No How many times?: 0 Other Self Harm Risks: pt denies Triggers for Past Attempts: None known Intentional Self Injurious Behavior: Damaging (Ongoing SA) Comment - Self Injurious Behavior: ongoing SA Family Suicide History: No Recent stressful life event(s): Other (Comment);Recent negative physical changes (Homelessness, medical consequences of drinking) Persecutory voices/beliefs?: No Depression: Yes Depression Symptoms: Despondent;Guilt Substance abuse history and/or treatment for substance abuse?: Yes Suicide prevention information given to non-admitted patients: Not applicable  Risk to Others Homicidal Ideation: No Thoughts of Harm to Others: No Current Homicidal Intent: No Current Homicidal Plan: No Access to Homicidal Means: No Identified Victim: pt denies History of harm to others?: No Assessment of Violence: None Noted Violent Behavior Description: na - pt calm, cooperative Does patient have access to weapons?: No Criminal Charges Pending?: No Does patient have a court date: No  Psychosis Hallucinations: None noted Delusions: None noted  Mental Status Report Appear/Hygiene: Other (Comment) (casual) Eye Contact: Good Motor Activity: Unremarkable Speech: Logical/coherent Level of  Consciousness: Alert Mood: Depressed Affect: Appropriate to circumstance Anxiety Level: Minimal Thought Processes: Coherent;Relevant Judgement: Unimpaired Orientation: Person;Place;Time;Situation Obsessive Compulsive Thoughts/Behaviors: None  Cognitive Functioning Concentration: Normal Memory: Recent Intact;Remote Intact IQ: Average Insight: Fair Impulse Control: Poor Appetite: Good Weight Loss: 10 Weight Gain: 0 Sleep: No Change Total Hours of Sleep: 10 Vegetative Symptoms: None  ADLScreening [facility] Assessment Services) Patient's cognitive ability adequate to safely complete daily activities?: Yes Patient able to express need for assistance with ADLs?: Yes Independently performs ADLs?: Yes (appropriate for developmental age)  Abuse/Neglect [facility]) Physical Abuse: Denies Verbal Abuse: Denies Sexual Abuse: Denies  Prior Inpatient Therapy Prior Inpatient Therapy: Yes Prior Therapy Dates: 1980's Prior Therapy Facilty/Terrilyn Tyner(s): [facility]  Reason for Treatment: Detox/Rehab   Prior Outpatient Therapy Prior Outpatient Therapy: No Prior Therapy Dates: None  Prior Therapy Facilty/Imojean Yoshino(s): None  Reason for Treatment: None   ADL Screening (condition at time of admission) Patient's cognitive ability adequate to safely complete daily activities?: Yes Patient able to express need for assistance with ADLs?: Yes Independently performs ADLs?: Yes (appropriate for developmental age) Weakness of Legs: None Weakness of Arms/Hands: None  Home Assistive Devices/Equipment Home Assistive Devices/Equipment: None    Abuse/Neglect Assessment (Assessment to be complete while patient is alone) Physical Abuse: Denies Verbal Abuse: Denies Sexual Abuse: Denies Exploitation of patient/patient's resources: Denies Self-Neglect: Denies Values / Beliefs Cultural Requests During Hospitalization: None Spiritual Requests During Hospitalization: None Consults Spiritual Care Consult  Needed: No Social Work Consult Needed: No Merchant navy officer (For Healthcare) Advance Directive: Patient does not have advance directive;Patient would not like information Patient requests advance directive information: Advance directive packet given    Additional Information 1:1 In Past 12 Months?: No CIRT Risk: No Elopement Risk: No Does patient have medical clearance?: Yes     Disposition:  Disposition Initial Assessment Completed: Yes Disposition of Patient: Referred to;Inpatient treatment program Type of inpatient treatment program: Adult Patient referred to: Other (Comment) (Pending [facility] and [facility]), [facility]  On Site Evaluation by:   Reviewed with Physician:  Mylo Red 05/20/2012 4:11 PM

## 2012-05-20 NOTE — ED Notes (Signed)
Pt reports he had a restless night. States he would wake up drenched in sweat.

## 2012-05-20 NOTE — ED Provider Notes (Signed)
0900.  Pt is sitting in bed eating, NAD.  No acute overnight events reported.  Note stable VS.  ACT Team is working towards placement in a detox program.  Will continue to follow.  Tobin Chad, MD 05/20/12 [phone]

## 2012-05-21 DIAGNOSIS — F1020 Alcohol dependence, uncomplicated: Secondary | ICD-10-CM

## 2012-05-21 NOTE — ED Notes (Signed)
Pt pending d/c to [facility] on Friday, 05/23/12, per PJ, RNCM/ACT.  CSW will continue to follow and assist with disposition as needed.

## 2012-05-21 NOTE — BH Assessment (Signed)
[facility] Assessment Progress Note      Request made for Dr Ferol Luz to consult on this patient for help with disposition. Request for consult made to the Dr.

## 2012-05-21 NOTE — Progress Notes (Signed)
   CARE MANAGEMENT ED NOTE 05/21/2012  Patient:  Zapanta,Sriram   Account Number:  [id number]  Date Initiated:  05/21/2012  Documentation initiated by:  Fransico Michael  Subjective/Objective Assessment:   presented to ED with c/o foot pain but also requesting assistance with detox     Subjective/Objective Assessment Detail:     Action/Plan:   needs assistance with placement   Action/Plan Detail:   Anticipated DC Date:       Status Recommendation to Physician:   Result of Recommendation:     In-house referral  Clinical Social Worker   DC Planning Services  CM consult    Choice offered to / List presented to:            Status of service:    ED Comments:   ED Comments Detail:   05/21/12-0955-J.Minnich,RN,BSN [phone]      Received call back from Leslie,CSW covering ED. Reported patient situation and concerns for safety and survivability if patient is discharged back to homeless situation. Also notified CSW of appointment for medicaid evaluation today. RNCM asked about letter of guaruntee or [employer] Vallone project possibilities.  05/21/12-0920-J.Minnich,RN,BSN [phone]      Spoke with financial counselor regarding patient situation. Appointment made with medicaid counselor for 1430 today. Dr. Rhunette Croft notified of pending appointment.  05/21/12-0847-J.Minnich,RN,BSN [phone]      Received call from Annette,ED RN regarding patient. Reports that patient is homeless and is having great difficulty walking and moving around. Reports concerns regarding patient being "discharged back to the street". Noted that patient does not have insurance.

## 2012-05-21 NOTE — ED Notes (Signed)
There is still no placement for patient for detox. Pt states he hopes we can find somewhere he can go for treatment and then find a longterm facility for him . He states if he has to go back out on the streets he will just go back to drinking and things will "just get worse"
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
## 2012-05-21 NOTE — ED Notes (Signed)
Pt eating lunch

## 2012-05-22 MED ORDER — LORAZEPAM 1 MG PO TABS
1.0000 mg | ORAL_TABLET | Freq: Once | ORAL | Status: AC
  Administered 2012-05-22: 1 mg via ORAL

## 2012-05-22 NOTE — ED Notes (Signed)
Pt. Continues to complain of "jerking". Will notify Dr. Rulon Abide.

## 2012-05-22 NOTE — ED Notes (Signed)
Speaking with a friend on phone-- requesting friend to bring some clothes to have when discharged.

## 2012-05-22 NOTE — ED Notes (Signed)
Pt. States "my arms just keep jerking and pulsating". This nurse did not notice any such behaviors while talking with patient about concerns.

## 2012-05-22 NOTE — ED Notes (Signed)
3 keys given to friend [name] [name]== pt concerned about wallet, phone, and another set of keys in camo bag-- explained that they are locked up=-

## 2012-05-22 NOTE — BH Assessment (Signed)
Assessment Note   [name] [name] is an 62 y.o. male that was reassessed this day.  Pt reported he continues to have anxiety, but that the Ativan the nurse gave him helped.  Pt denies SI/HI or psychosis.  Pt has been accepted to [facility] and can be transported there in the morning, 05/23/12.  Called [facility] and confirmed this with Sunmi @ 0815.  Pt aware and in agreement with disposition.  SW working with pt for placement issues, as pt is homeless.  Pt only complains of anxiety as a current withdrawal symptom at this time.  Completed reassessment, assessment notification, and faxed to [facility] to log.  Updated ED staff.  Previous Notes:  [name] [name] is an 62 y.o. male that was reassessed this day. Pt denies SI/HI or psychosis. Pt reports he still wants detox from alcohol. Pt reports more anxiety today in reference to withdrawal sx. Pt has been declined at [facility] and [facility] for medical acuity. Called [facility] and no beds per [facility] @ 1219. Called [facility] @ [phone], no beds per Okey Regal. Called [facility] and per [place], pt still pending there and referral under review @ 1142. Called [facility] and pt still under review per Jasmine December @ 1143. Called [facility], left message @ 1144 and Okey Regal called back stating beds available and referral faxed @ for review. Called [facility] and Sumni stated so fax referral for review @ 1220. Received call back for additional information and this was also faxed to [facility] for review. Completed reassessment, assessment notification, and faced to [facility] to log. Pt under review at [facility], [facility], and [facility]. Updated ED staff.  Previous Note:  [name] [name] is an 62 y.o. male. Pt requests detox from alcohol. Pt has been drinking pint containers of mouthwash, 2 pints per day for past 6 months. Last use this morning at 11am. Pt does report withdrawal symptoms of shakes, sweats, and anxiety. Pt vitals are elevated and pt does appear to be withdrawing at this time. Pt also reports a history of siezures but could  not tell the date of last siezure. Pt reports he was recently told during an ED visit that his electrolytes were all off due to his drinking and he needed to stop or he could die. Pt does report some depression and states he is homeless currently. Pt reports he has had some thoughts of "what is the use of living" as recently as yesterday but denies current SI/HI/AV   Axis I: 303.90 Alcohol Dependence Axis II: Deferred Axis III:  Past Medical History  Diagnosis Date  . Hypercholesteremia   . Stroke   . Hypertension   . Alcohol abuse   . Seizures   . Depression    Axis IV: economic problems, housing problems, other psychosocial or environmental problems, problems related to social environment, problems with access to health care services and problems with primary support group Axis V: 41-50 serious symptoms  Past Medical History:  Past Medical History  Diagnosis Date  . Hypercholesteremia   . Stroke   . Hypertension   . Alcohol abuse   . Seizures   . Depression     No past surgical history on file.  Family History: No family history on file.  Social History:  reports that he has been smoking Cigarettes.  He has been smoking about 0.25 packs per day. He does not have any smokeless tobacco history on file. He reports that  drinks alcohol. He reports that he does not use illicit drugs.  Additional Social History:  Alcohol / Drug Use Pain Medications: Pt denies Prescriptions: Pt denies Over the Counter: Pt currently drinking mouthwash History of alcohol / drug use?: Yes Longest period of sobriety (when/how long): None  Negative Consequences of Use: Financial;Legal;Personal relationships Withdrawal Symptoms: Blackouts;Seizures Onset of Seizures: pt could not say Date of most recent seizure: unknown Substance #1 Name of Substance 1: alcohol/mouthwash 1 - Age of First Use: 10 1 - Amount (size/oz): 2 pints 1 - Frequency: daily 1 - Duration: 6 months 1 - Last Use / Amount:  3/2, 3 oz mouthwash  CIWA: CIWA-Ar BP: 132/75 mmHg Pulse Rate: 75 Nausea and Vomiting: no nausea and no vomiting Tactile Disturbances: very mild itching, pins and needles, burning or numbness Tremor: no tremor Auditory Disturbances: not present Paroxysmal Sweats: no sweat visible Visual Disturbances: not present Anxiety: three Headache, Fullness in Head: none present Agitation: normal activity Orientation and Clouding of Sensorium: oriented and can do serial additions CIWA-Ar Total: 9 COWS:    Allergies: No Known Allergies  Home Medications:  (Not in a hospital admission)  OB/GYN Status:  No LMP for male patient.  General Assessment Data Location of Assessment: [facility] ED ACT Assessment: Yes Living Arrangements: Other (Comment) (homeless) Can pt return to current living arrangement?: Yes Admission Status: Voluntary Is patient capable of signing voluntary admission?: Yes Transfer from: Acute Hospital Referral Source: Self/Family/Friend  Education Status Is patient currently in school?: No  Risk to self Suicidal Ideation: No Suicidal Intent: No Is patient at risk for suicide?: No Suicidal Plan?: No Access to Means: No What has been your use of drugs/alcohol within the last 12 months?: Daily use of alcohol Previous Attempts/Gestures: No How many times?: 0 Other Self Harm Risks: pt denies Triggers for Past Attempts: None known Intentional Self Injurious Behavior: Damaging Comment - Self Injurious Behavior: ongoing SA Family Suicide History: No Recent stressful life event(s): Other (Comment);Recent negative physical changes (homelessness, medical consequences of drinking) Persecutory voices/beliefs?: No Depression: Yes Depression Symptoms: Despondent;Guilt Substance abuse history and/or treatment for substance abuse?: Yes Suicide prevention information given to non-admitted patients: Not applicable  Risk to Others Homicidal Ideation: No Thoughts of Harm to Others:  No Current Homicidal Intent: No Current Homicidal Plan: No Access to Homicidal Means: No Identified Victim: pt denies History of harm to others?: No Assessment of Violence: None Noted Violent Behavior Description: na - pt calm, cooperative Does patient have access to weapons?: No Criminal Charges Pending?: No Does patient have a court date: No  Psychosis Hallucinations: None noted Delusions: None noted  Mental Status Report Appear/Hygiene: Other (Comment) (casual) Eye Contact: Good Motor Activity: Unremarkable Speech: Logical/coherent Level of Consciousness: Alert Mood: Depressed Affect: Appropriate to circumstance Anxiety Level: Minimal Thought Processes: Coherent;Relevant Judgement: Unimpaired Orientation: Person;Place;Time;Situation Obsessive Compulsive Thoughts/Behaviors: None  Cognitive Functioning Concentration: Normal Memory: Recent Intact;Remote Intact IQ: Average Insight: Fair Impulse Control: Poor Appetite: Good Weight Loss: 10 Weight Gain: 0 Sleep: No Change Total Hours of Sleep: 10 Vegetative Symptoms: None  ADLScreening [facility]) Patient's cognitive ability adequate to safely complete daily activities?: Yes Patient able to express need for assistance with ADLs?: Yes Independently performs ADLs?: Yes (appropriate for developmental age)  Abuse/Neglect [facility]) Physical Abuse: Denies Verbal Abuse: Denies Sexual Abuse: Denies  Prior Inpatient Therapy Prior Inpatient Therapy: Yes Prior Therapy Dates: 1980's Prior Therapy Facilty/Provider(s): [facility]  Reason for Treatment: Detox/Rehab   Prior Outpatient Therapy Prior Outpatient Therapy: No Prior Therapy Dates: None  Prior Therapy Facilty/Provider(s): None  Reason for Treatment: None  ADL Screening (condition at time of admission) Patient's cognitive ability adequate to safely complete daily activities?: Yes Patient able to express need for assistance with ADLs?:  Yes Independently performs ADLs?: Yes (appropriate for developmental age) Weakness of Legs: None Weakness of Arms/Hands: None  Home Assistive Devices/Equipment Home Assistive Devices/Equipment: None    Abuse/Neglect Assessment (Assessment to be complete while patient is alone) Physical Abuse: Denies Verbal Abuse: Denies Sexual Abuse: Denies Exploitation of patient/patient's resources: Denies Self-Neglect: Denies Values / Beliefs Cultural Requests During Hospitalization: None Spiritual Requests During Hospitalization: None Consults Spiritual Care Consult Needed: No Social Work Consult Needed: No Merchant navy officer (For Healthcare) Advance Directive: Patient would not like information Patient requests advance directive information: Advance directive packet given Nutrition Screen- [facility] Adult/WL/AP Patient's home diet: Regular  Additional Information 1:1 In Past 12 Months?: No CIRT Risk: No Elopement Risk: No Does patient have medical clearance?: Yes     Disposition:  Disposition Initial Assessment Completed: Yes Disposition of Patient: Referred to;Inpatient treatment program Type of inpatient treatment program: Adult Patient referred to: Other (Comment) (Pt pending [facility])  On Site Evaluation by:   Reviewed with Physician:  Theophilus Kinds, Rennis Harding 05/22/2012 8:25 AM

## 2012-05-22 NOTE — ED Provider Notes (Signed)
Alcoholic await [facility] placement tomorrow; no SI; pleasant and smiling eating in ED.  Hurman Horn, MD 05/22/12 2129

## 2012-05-23 MED ORDER — LORAZEPAM 1 MG PO TABS
1.0000 mg | ORAL_TABLET | Freq: Once | ORAL | Status: AC
Start: 2012-05-23 — End: 2012-05-23
  Administered 2012-05-23: 1 mg via ORAL
  Filled 2012-05-23: qty 1

## 2012-05-23 NOTE — ED Notes (Signed)
Long conversation with pt re: entering rehab, Medicaid/disability and housing issues.  Emotional support offered.  Pt is agreeable to go to [facility].  [facility] is expecting him to arrive on Sunday, 05/25/12.  [facility] will be transporting him ([phone] or [phone]) and CSW clarified with them today that it would be an appropriate transport.  [facility] will need a call to let them know that pt is coming and will also need updated med list and a d/c note faxed prior to pt's arrival.  W/E CSW aware, as well as ACT.  [facility] number is [phone].

## 2012-05-23 NOTE — ED Notes (Signed)
Patient alerted staff and advised he was having "funny" feeling with heart.  Patient VSS and pulse regular, NAD noted, EKG performed and given to Dr. Dierdre Highman.  Medication orders given and administered.  Patient states no pain at this time.
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
## 2012-05-24 MED ORDER — OXYCODONE-ACETAMINOPHEN 5-325 MG PO TABS
1.0000 | ORAL_TABLET | Freq: Once | ORAL | Status: AC
  Administered 2012-05-24: 1 via ORAL
  Filled 2012-05-24: qty 1
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
## 2012-05-24 NOTE — ED Notes (Signed)
Pt amb to [facility] without problem.  Pt is cooperative

## 2012-06-24 ENCOUNTER — Emergency Department (HOSPITAL_COMMUNITY)
Admission: EM | Admit: 2012-06-24 | Discharge: 2012-06-25 | Disposition: A | Discharge status: Home / Self Care | Payer: Self-pay | Attending: Emergency Medicine | Admitting: Emergency Medicine

## 2012-06-24 ENCOUNTER — Encounter (HOSPITAL_COMMUNITY): Payer: Self-pay | Admitting: Emergency Medicine

## 2012-06-24 DIAGNOSIS — F1020 Alcohol dependence, uncomplicated: Secondary | ICD-10-CM | POA: Insufficient documentation

## 2012-06-24 DIAGNOSIS — Z8669 Personal history of other diseases of the nervous system and sense organs: Secondary | ICD-10-CM | POA: Insufficient documentation

## 2012-06-24 DIAGNOSIS — F10929 Alcohol use, unspecified with intoxication, unspecified: Secondary | ICD-10-CM

## 2012-06-24 DIAGNOSIS — Z5900 Homelessness unspecified: Secondary | ICD-10-CM | POA: Insufficient documentation

## 2012-06-24 DIAGNOSIS — F17200 Nicotine dependence, unspecified, uncomplicated: Secondary | ICD-10-CM | POA: Insufficient documentation

## 2012-06-24 DIAGNOSIS — I10 Essential (primary) hypertension: Secondary | ICD-10-CM | POA: Insufficient documentation

## 2012-06-24 DIAGNOSIS — Z8659 Personal history of other mental and behavioral disorders: Secondary | ICD-10-CM | POA: Insufficient documentation

## 2012-06-24 DIAGNOSIS — Z7902 Long term (current) use of antithrombotics/antiplatelets: Secondary | ICD-10-CM | POA: Insufficient documentation

## 2012-06-24 DIAGNOSIS — Z79899 Other long term (current) drug therapy: Secondary | ICD-10-CM | POA: Insufficient documentation

## 2012-06-24 DIAGNOSIS — Z8673 Personal history of transient ischemic attack (TIA), and cerebral infarction without residual deficits: Secondary | ICD-10-CM | POA: Insufficient documentation

## 2012-06-24 DIAGNOSIS — E7800 Pure hypercholesterolemia, unspecified: Secondary | ICD-10-CM | POA: Insufficient documentation

## 2012-06-24 HISTORY — DX: Homelessness: Z59.0

## 2012-06-24 HISTORY — DX: Homelessness unspecified: Z59.00

## 2012-06-24 LAB — CBC WITH DIFFERENTIAL/PLATELET
Basophils Relative: 0 % (ref 0–1)
Eosinophils Absolute: 0.3 10*3/uL (ref 0.0–0.7)
Lymphs Abs: 2 10*3/uL (ref 0.7–4.0)
MCH: 35.5 pg — ABNORMAL HIGH (ref 26.0–34.0)
Neutrophils Relative %: 65 % (ref 43–77)
Platelets: 281 10*3/uL (ref 150–400)
RBC: 3.58 MIL/uL — ABNORMAL LOW (ref 4.22–5.81)

## 2012-06-24 NOTE — ED Notes (Signed)
PT. ARRIVED WITH EMS FROM BUS STOP ( [place]OM[place]ESS)  REQUESTING DETOX FOR ALCOHOL ABUSE REPORTS INGESTED 2 PINTS OF MOUTHWASH THIS EVENING. DENIES SUICIDAL IDEATION .

## 2012-06-25 LAB — COMPREHENSIVE METABOLIC PANEL
ALT: 15 U/L (ref 0–53)
Albumin: 3.9 g/dL (ref 3.5–5.2)
Alkaline Phosphatase: 68 U/L (ref 39–117)
Glucose, Bld: 96 mg/dL (ref 70–99)
Potassium: 3.9 mEq/L (ref 3.5–5.1)
Sodium: 133 mEq/L — ABNORMAL LOW (ref 135–145)
Total Protein: 7.8 g/dL (ref 6.0–8.3)

## 2012-06-25 LAB — RAPID URINE DRUG SCREEN, HOSP PERFORMED
Amphetamines: NOT DETECTED
Opiates: NOT DETECTED
Tetrahydrocannabinol: NOT DETECTED

## 2012-06-25 NOTE — ED Notes (Signed)
Pt requesting help finding a place to stay, states is homeless at this time. Pt given resource list for homeless shelters and outpatient psychiatric clinics.

## 2012-06-25 NOTE — ED Notes (Signed)
Bus pass given to pt. at waiting area.

## 2012-06-25 NOTE — ED Provider Notes (Signed)
History     CSN: [id number]  Arrival date & time 06/24/12  2324   First MD Initiated Contact with Patient 06/24/12 2337      Chief Complaint  Patient presents with  . Alcohol Problem   level V caveat applies secondary to alcohol intoxication  HPI  History provided by the patient. Patient 62 year old male currently homeless with history of alcohol addiction, depression, hypertension hypercholesterolemia presenting with alcohol intoxication. Patient was recently released from a 30 day alcohol detox program. Patient admits to drinking once pain released and states this is secondary to his homelessness. He states he just needs a place to stay in this would help with his problems. He denies any other complaints. Denies any recent fever, chills, cough or cold. No nausea vomiting symptoms. He denies any depression, SI or HI. No other drug use.    Past Medical History  Diagnosis Date  . Hypercholesteremia   . Stroke   . Hypertension   . Alcohol abuse   . Seizures   . Depression   . Homelessness     History reviewed. No pertinent past surgical history.  No family history on file.  History  Substance Use Topics  . Smoking status: Current Every Day Smoker -- 0.25 packs/day    Types: Cigarettes  . Smokeless tobacco: Not on file  . Alcohol Use: Yes     Comment: drinks bottles of 2 pint  mouthwash daily      Review of Systems  Unable to perform ROS: Other    Allergies  Review of patient's allergies indicates no known allergies.  Home Medications   Current Outpatient Rx  Name  Route  Sig  Dispense  Refill  . amLODipine (NORVASC) 10 MG tablet   Oral   Take 1 tablet (10 mg total) by mouth daily.   30 tablet   4   . clopidogrel (PLAVIX) 75 MG tablet   Oral   Take 75 mg by mouth daily with breakfast.         . gabapentin (NEURONTIN) 100 MG capsule   Oral   Take 1 capsule (100 mg total) by mouth 3 (three) times daily.   90 capsule   0   . lisinopril  (PRINIVIL,ZESTRIL) 5 MG tablet   Oral   Take 5 mg by mouth daily.         . Multiple Vitamin (MULTIVITAMIN WITH MINERALS) TABS   Oral   Take 1 tablet by mouth daily.           There were no vitals taken for this visit.  Physical Exam  Nursing note and vitals reviewed. Constitutional: He is oriented to person, place, and time. He appears well-developed and well-nourished. No distress.  HENT:  Head: Normocephalic.  Breath smells of alcohol  Cardiovascular: Normal rate and regular rhythm.   Pulmonary/Chest: Effort normal and breath sounds normal.  Abdominal: Soft.  Musculoskeletal: Normal range of motion.  Small abrasion to left thumb. No active bleeding. Normal movements.  Neurological: He is alert and oriented to person, place, and time.  Skin: Skin is warm.  Psychiatric: He has a normal mood and affect. His behavior is normal.    ED Course  Procedures   Results for orders placed during the hospital encounter of 06/24/12  ETHANOL      Result Value Range   Alcohol, Ethyl (B) 218 (*) 0 - 11 mg/dL  CBC WITH DIFFERENTIAL      Result Value Range   WBC 7.9  4.0 - 10.5 K/uL   RBC 3.58 (*) 4.22 - 5.81 MIL/uL   Hemoglobin 12.7 (*) 13.0 - 17.0 g/dL   HCT 78.2 (*) 95.6 - 21.3 %   MCV 101.1 (*) 78.0 - 100.0 fL   MCH 35.5 (*) 26.0 - 34.0 pg   MCHC 35.1  30.0 - 36.0 g/dL   RDW 08.6  57.8 - 46.9 %   Platelets 281  150 - 400 K/uL   Neutrophils Relative 65  43 - 77 %   Neutro Abs 5.2  1.7 - 7.7 K/uL   Lymphocytes Relative 25  12 - 46 %   Lymphs Abs 2.0  0.7 - 4.0 K/uL   Monocytes Relative 6  3 - 12 %   Monocytes Absolute 0.5  0.1 - 1.0 K/uL   Eosinophils Relative 4  0 - 5 %   Eosinophils Absolute 0.3  0.0 - 0.7 K/uL   Basophils Relative 0  0 - 1 %   Basophils Absolute 0.0  0.0 - 0.1 K/uL  COMPREHENSIVE METABOLIC PANEL      Result Value Range   Sodium 133 (*) 135 - 145 mEq/L   Potassium 3.9  3.5 - 5.1 mEq/L   Chloride 97  96 - 112 mEq/L   CO2 26  19 - 32 mEq/L    Glucose, Bld 96  70 - 99 mg/dL   BUN 22  6 - 23 mg/dL   Creatinine, Ser 6.29  0.50 - 1.35 mg/dL   Calcium 9.3  8.4 - 52.8 mg/dL   Total Protein 7.8  6.0 - 8.3 g/dL   Albumin 3.9  3.5 - 5.2 g/dL   AST 17  0 - 37 U/L   ALT 15  0 - 53 U/L   Alkaline Phosphatase 68  39 - 117 U/L   Total Bilirubin 0.1 (*) 0.3 - 1.2 mg/dL   GFR calc non Af Amer 90 (*) >90 mL/min   GFR calc Af Amer >90  >90 mL/min        1. Alcohol intoxication       MDM  12:30 AM patient seen and evaluated. Patient appears well in no acute distress.  Patient intoxicated. When allowed to sober and discharge. Patient given outpatient resources for alcoholism and homeless shelters.      Angus Seller, PA-C 06/25/12 [phone]
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
## 2012-06-25 NOTE — ED Notes (Signed)
Pt BP 88/66, EDPA made aware, states would like a re-check of BP in bilateral arms.

## 2012-06-26 NOTE — ED Provider Notes (Signed)
Medical screening examination/treatment/procedure(s) were performed by non-physician practitioner and as supervising physician I was immediately available for consultation/collaboration.  John-Adam Analynn Daum, M.D.     John-Adam Wrigley Winborne, MD 06/26/12 0508 

## 2012-07-15 ENCOUNTER — Emergency Department (HOSPITAL_COMMUNITY)
Admission: EM | Admit: 2012-07-15 | Discharge: 2012-07-17 | Disposition: A | Discharge status: Home / Self Care | Payer: Self-pay | Attending: Emergency Medicine | Admitting: Emergency Medicine

## 2012-07-15 ENCOUNTER — Emergency Department (HOSPITAL_COMMUNITY): Payer: Self-pay

## 2012-07-15 ENCOUNTER — Encounter (HOSPITAL_COMMUNITY): Payer: Self-pay

## 2012-07-15 ENCOUNTER — Encounter (HOSPITAL_COMMUNITY): Payer: Self-pay | Admitting: Emergency Medicine

## 2012-07-15 ENCOUNTER — Emergency Department (HOSPITAL_COMMUNITY)
Admission: EM | Admit: 2012-07-15 | Discharge: 2012-07-15 | Disposition: A | Discharge status: Home / Self Care | Payer: Self-pay | Attending: Emergency Medicine | Admitting: Emergency Medicine

## 2012-07-15 DIAGNOSIS — R531 Weakness: Secondary | ICD-10-CM

## 2012-07-15 DIAGNOSIS — Z79899 Other long term (current) drug therapy: Secondary | ICD-10-CM | POA: Insufficient documentation

## 2012-07-15 DIAGNOSIS — F329 Major depressive disorder, single episode, unspecified: Secondary | ICD-10-CM | POA: Insufficient documentation

## 2012-07-15 DIAGNOSIS — Z8639 Personal history of other endocrine, nutritional and metabolic disease: Secondary | ICD-10-CM | POA: Insufficient documentation

## 2012-07-15 DIAGNOSIS — F17200 Nicotine dependence, unspecified, uncomplicated: Secondary | ICD-10-CM | POA: Insufficient documentation

## 2012-07-15 DIAGNOSIS — R5381 Other malaise: Secondary | ICD-10-CM | POA: Insufficient documentation

## 2012-07-15 DIAGNOSIS — Z5900 Homelessness unspecified: Secondary | ICD-10-CM | POA: Insufficient documentation

## 2012-07-15 DIAGNOSIS — G40909 Epilepsy, unspecified, not intractable, without status epilepticus: Secondary | ICD-10-CM | POA: Insufficient documentation

## 2012-07-15 DIAGNOSIS — Z7902 Long term (current) use of antithrombotics/antiplatelets: Secondary | ICD-10-CM | POA: Insufficient documentation

## 2012-07-15 DIAGNOSIS — F3289 Other specified depressive episodes: Secondary | ICD-10-CM | POA: Insufficient documentation

## 2012-07-15 DIAGNOSIS — Z8673 Personal history of transient ischemic attack (TIA), and cerebral infarction without residual deficits: Secondary | ICD-10-CM | POA: Insufficient documentation

## 2012-07-15 DIAGNOSIS — I10 Essential (primary) hypertension: Secondary | ICD-10-CM | POA: Insufficient documentation

## 2012-07-15 DIAGNOSIS — R5383 Other fatigue: Secondary | ICD-10-CM | POA: Insufficient documentation

## 2012-07-15 DIAGNOSIS — Z862 Personal history of diseases of the blood and blood-forming organs and certain disorders involving the immune mechanism: Secondary | ICD-10-CM | POA: Insufficient documentation

## 2012-07-15 DIAGNOSIS — E876 Hypokalemia: Secondary | ICD-10-CM | POA: Insufficient documentation

## 2012-07-15 LAB — CBC WITH DIFFERENTIAL/PLATELET
Basophils Relative: 0 % (ref 0–1)
Eosinophils Relative: 1 % (ref 0–5)
HCT: 36.7 % — ABNORMAL LOW (ref 39.0–52.0)
Hemoglobin: 12.8 g/dL — ABNORMAL LOW (ref 13.0–17.0)
MCHC: 34.9 g/dL (ref 30.0–36.0)
MCV: 99.2 fL (ref 78.0–100.0)
Monocytes Absolute: 0.5 10*3/uL (ref 0.1–1.0)
Monocytes Relative: 9 % (ref 3–12)
Neutro Abs: 3.9 10*3/uL (ref 1.7–7.7)

## 2012-07-15 LAB — URINALYSIS, ROUTINE W REFLEX MICROSCOPIC
Bilirubin Urine: NEGATIVE
Glucose, UA: NEGATIVE mg/dL
Ketones, ur: NEGATIVE mg/dL
pH: 7.5 (ref 5.0–8.0)

## 2012-07-15 LAB — HEPATIC FUNCTION PANEL
Alkaline Phosphatase: 79 U/L (ref 39–117)
Bilirubin, Direct: <0.1 mg/dL (ref 0.0–0.3)
Total Bilirubin: 0.3 mg/dL (ref 0.3–1.2)

## 2012-07-15 LAB — BASIC METABOLIC PANEL
BUN: 10 mg/dL (ref 6–23)
CO2: 28 mEq/L (ref 19–32)
Calcium: 8.9 mg/dL (ref 8.4–10.5)
Chloride: 99 mEq/L (ref 96–112)
Creatinine, Ser: 0.69 mg/dL (ref 0.50–1.35)
GFR calc Af Amer: >90 mL/min (ref >90)

## 2012-07-15 LAB — ETHANOL: Alcohol, Ethyl (B): 41 mg/dL — ABNORMAL HIGH (ref 0–11)

## 2012-07-15 MED ORDER — FOLIC ACID 1 MG PO TABS
1.0000 mg | ORAL_TABLET | Freq: Once | ORAL | Status: AC
  Administered 2012-07-15: 1 mg via ORAL
  Filled 2012-07-15: qty 1

## 2012-07-15 MED ORDER — SODIUM CHLORIDE 0.9 % IV BOLUS (SEPSIS)
1000.0000 mL | Freq: Once | INTRAVENOUS | Status: AC
  Administered 2012-07-15: 1000 mL via INTRAVENOUS

## 2012-07-15 MED ORDER — POTASSIUM CHLORIDE CRYS ER 20 MEQ PO TBCR
40.0000 meq | EXTENDED_RELEASE_TABLET | Freq: Once | ORAL | Status: AC
  Administered 2012-07-15: 40 meq via ORAL
  Filled 2012-07-15: qty 2

## 2012-07-15 MED ORDER — ADULT MULTIVITAMIN W/MINERALS CH
1.0000 | ORAL_TABLET | Freq: Every day | ORAL | Status: DC
  Administered 2012-07-15: 1 via ORAL
  Filled 2012-07-15: qty 1

## 2012-07-15 MED ORDER — VITAMIN B-1 100 MG PO TABS
500.0000 mg | ORAL_TABLET | Freq: Once | ORAL | Status: AC
  Administered 2012-07-15: 500 mg via ORAL
  Filled 2012-07-15: qty 5

## 2012-07-15 MED ORDER — POTASSIUM CHLORIDE 10 MEQ/100ML IV SOLN
10.0000 meq | Freq: Once | INTRAVENOUS | Status: AC
  Administered 2012-07-15: 10 meq via INTRAVENOUS
  Filled 2012-07-15: qty 100

## 2012-07-15 NOTE — ED Notes (Signed)
Pt c/o generalized weakness that started today. Pt reports he was just d/c from ED and returned because he didn't think he could make it all the way to the bus stop. Pt in nad, skin warm and dry, resp e/u. Pt a&oX4. No facial droop seen, equal hand grips, pt denies numbness/tingling.

## 2012-07-15 NOTE — ED Notes (Signed)
Pt did not want to have d/c instructions discussed stating that he was going to miss the bus. Explained to pt that he would not miss the bus if he waited to have d/c instructions discussed/reviwed. Pt states I don't care I have got to go. Pt took d/c paperwork and put in his bag and said he was not waiting and walked out. Attempted to get pt to wait and have d/c instructions discussed to ensure pt understood d/c teaching but pt refused and left ED.

## 2012-07-15 NOTE — ED Notes (Signed)
Pt refused to have d/c instructions and resource guide reviewed; pt took printed copy of d/c paperwork and stated "I don't care. I don't have time for this. I have to catch the bus." pt took paperwork folded it up and placed in bag. Pt refused to sign out stating that he did not have time to wait to sign out. Pt ambulatory upon d/c. Pt leaving with printed d/c paperwork that was not reviewed due to pt refusal to wait for instructions. Pt walked out of room. Pt given bus pass previous. NAD noted upon d/c.

## 2012-07-15 NOTE — ED Notes (Signed)
Per EMS, pt at bus stop, pt is homeless, pt c/o generalized weakness and unsteady on feet starting approx 1300 today. Pt admits to ETOH abuse but drank less than normal amount today, pt drinks mouthwash, pt drank normal amount yesterday which is 2 pints/day. CBG 159. Pt denies pain, N/V/D

## 2012-07-15 NOTE — ED Provider Notes (Signed)
History     CSN: [id number]  Arrival date & time 07/15/12  1512   First MD Initiated Contact with Patient 07/15/12 1512      Chief Complaint  Patient presents with  . Weakness    (Consider location/radiation/quality/duration/timing/severity/associated sxs/prior treatment) HPI Comments: Patient is a 62 year old male with a past medical history of hypertension, previous CVA, ETOH abuse, and homelessness who presents with weakness that started earlier today. Symptoms started gradually and progressively worsened. The weakness is generalized. Patient reports feeling unsteady on his feet. He reports drinking mouthwash daily but today he drank less than usual. No associated symptoms. No aggravating/alleviating factors.    Past Medical History  Diagnosis Date  . Hypercholesteremia   . Stroke   . Hypertension   . Alcohol abuse   . Seizures   . Depression   . Homelessness     History reviewed. No pertinent past surgical history.  History reviewed. No pertinent family history.  History  Substance Use Topics  . Smoking status: Current Every Day Smoker -- 0.25 packs/day    Types: Cigarettes  . Smokeless tobacco: Not on file  . Alcohol Use: Yes     Comment: drinks bottles of 2 pint  mouthwash daily      Review of Systems  Neurological: Positive for weakness.  All other systems reviewed and are negative.    Allergies  Review of patient's allergies indicates no known allergies.  Home Medications   Current Outpatient Rx  Name  Route  Sig  Dispense  Refill  . clopidogrel (PLAVIX) 75 MG tablet   Oral   Take 75 mg by mouth daily with breakfast.         . gabapentin (NEURONTIN) 100 MG capsule   Oral   Take 1 capsule (100 mg total) by mouth 3 (three) times daily.   90 capsule   0   . lisinopril (PRINIVIL,ZESTRIL) 5 MG tablet   Oral   Take 5 mg by mouth daily.         . Multiple Vitamin (MULTIVITAMIN WITH MINERALS) TABS   Oral   Take 1 tablet by mouth daily.            BP 160/90  Pulse 94  Temp(Src) 98.7 F (37.1 C) (Oral)  Resp 16  SpO2 100%  Physical Exam  Nursing note and vitals reviewed. Constitutional: He is oriented to person, place, and time. He appears well-developed and well-nourished. No distress.  HENT:  Head: Normocephalic and atraumatic.  Eyes: Conjunctivae are normal.  Neck: Normal range of motion.  Cardiovascular: Normal rate and regular rhythm.  Exam reveals no gallop and no friction rub.   No murmur heard. Pulmonary/Chest: Effort normal and breath sounds normal. He has no wheezes. He has no rales. He exhibits no tenderness.  Abdominal: Soft. There is no tenderness.  Musculoskeletal: Normal range of motion.  Neurological: He is alert and oriented to person, place, and time. Coordination normal.  Speech is goal-oriented. Moves limbs without ataxia.   Skin: Skin is warm and dry.  Psychiatric: He has a normal mood and affect. His behavior is normal.    ED Course  Procedures (including critical care time)  Labs Reviewed  CBC WITH DIFFERENTIAL - Abnormal; Notable for the following:    RBC 3.70 (*)    Hemoglobin 12.8 (*)    HCT 36.7 (*)    MCH 34.6 (*)    All other components within normal limits  BASIC METABOLIC PANEL - Abnormal; Notable  for the following:    Potassium 3.0 (*)    Glucose, Bld 150 (*)    All other components within normal limits  URINALYSIS, ROUTINE W REFLEX MICROSCOPIC - Abnormal; Notable for the following:    APPearance HAZY (*)    All other components within normal limits  ETHANOL - Abnormal; Notable for the following:    Alcohol, Ethyl (B) 41 (*)    All other components within normal limits  HEPATIC FUNCTION PANEL - Abnormal; Notable for the following:    AST 39 (*)    All other components within normal limits  URINE CULTURE  TROPONIN I  CK   Dg Chest 2 View  07/15/2012  *RADIOLOGY REPORT*  Clinical Data: Weakness  CHEST - 2 VIEW  Comparison: 04/27/2012  Findings: Heart size upper  limits of normal.  Diffusely increased interstitial lung markings noted, without focal pulmonary opacity. No new focal opacity.  No pleural effusion.  No acute osseous abnormality.  IMPRESSION: Chronically prominent interstitial markings without new focal pulmonary opacity.   Original Report Authenticated By: Christiana Pellant, M.D.      1. Weakness generalized       MDM  6:01 PM Labs show hypokalemia with remaining labs unremarkable. Patient given potassium. Troponin negative.  8:39 PM Patient given IV potassium, food, multivitamin, folic acid and thiamine. Vitals stable and patient is afebrile. Patient will be discharged without further evaluation. Patient's cause of weakness unknown. Possibly due to not eating enough food due to homelessness and lack of access to food. Patient instructed to return with worsening or concerning symptoms.       Emilia Beck, PA-C 07/15/12 2245

## 2012-07-15 NOTE — ED Notes (Addendum)
PT. REPORTS GENERALIZED WEAKNESS ONSET TODAY , AMBULATORY , SEEN HERE THIS EVENING DISCHARGED HOME . PT. STATES HE HAS NO WHERE TO STAY( HOMELESS).

## 2012-07-15 NOTE — ED Provider Notes (Signed)
62 year old male who is homeless and an alcoholic noted dissection in that he had chills and felt generally shaky and weak. There is some vague chest tightness and some pain in his left arm and left leg. He normally drinks 2 pints of mouth twice a day but today he only drank about 2 ounces. He is complaining of some mild dyspnea. There's been no change in a chronic cough which is only mild. There's no nausea or vomiting or diarrhea. On exam, he is resting comfortably and is noted to be mildly tremulous. Lungs are clear and heart has a regular rate and rhythm. He is mildly tachycardic. Screening labs, x-ray, ECG will be obtained.   Date: 07/15/2012  Rate: 100  Rhythm: normal sinus rhythm  QRS Axis: normal  Intervals: normal  ST/T Wave abnormalities: normal  Conduction Disutrbances:none  Narrative Interpretation: Normal ECG. When compared with ECG of 05/23/2012, no significant changes are seen.  Old EKG Reviewed: unchanged  Workup has come back unremarkable. Patient will be discharged.  Medical screening examination/treatment/procedure(s) were conducted as a shared visit with non-physician practitioner(s) and myself.  I personally evaluated the patient during the encounter\   Dione Booze, MD 07/15/12 2352

## 2012-07-15 NOTE — ED Provider Notes (Signed)
History     CSN: [id number]  Arrival date & time 07/15/12  2141   First MD Initiated Contact with Patient 07/15/12 2203      Chief Complaint  Patient presents with  . Weakness    (Consider location/radiation/quality/duration/timing/severity/associated sxs/prior treatment) HPI Comments: Patient is a 62 year old male with a past medical history of hypertension, previous CVA, ETOH abuse and homelessness who presents who presents for the second time tonight for weakness that started this afternoon. Patient reports gradual onset of symptoms and progressive worsening. Patient was seen earlier this evening in the ED with the same complaint. His workup was unremarkable and he was discharged. Patient reports trying to get on the bus after discharge and felt "unsteady." He felt like he would be in danger on the bus in his condition and decided to check back in. Patient denies any new symptoms. No aggravating/alleviating factors. No associated symptoms.    Past Medical History  Diagnosis Date  . Hypercholesteremia   . Stroke   . Hypertension   . Alcohol abuse   . Seizures   . Depression   . Homelessness     History reviewed. No pertinent past surgical history.  No family history on file.  History  Substance Use Topics  . Smoking status: Current Every Day Smoker -- 0.25 packs/day    Types: Cigarettes  . Smokeless tobacco: Not on file  . Alcohol Use: Yes     Comment: drinks bottles of 2 pint  mouthwash daily      Review of Systems  Neurological: Positive for weakness.  All other systems reviewed and are negative.    Allergies  Review of patient's allergies indicates no known allergies.  Home Medications   Current Outpatient Rx  Name  Route  Sig  Dispense  Refill  . amLODipine (NORVASC) 10 MG tablet   Oral   Take 10 mg by mouth daily.         . clopidogrel (PLAVIX) 75 MG tablet   Oral   Take 75 mg by mouth daily with breakfast.         . gabapentin (NEURONTIN)  100 MG capsule   Oral   Take 1 capsule (100 mg total) by mouth 3 (three) times daily.   90 capsule   0   . lisinopril (PRINIVIL,ZESTRIL) 5 MG tablet   Oral   Take 5 mg by mouth daily.         . Multiple Vitamin (MULTIVITAMIN WITH MINERALS) TABS   Oral   Take 1 tablet by mouth daily.         . Thiamine HCl (VITAMIN B-1 PO)   Oral   Take 1 tablet by mouth daily.           BP 157/113  Pulse 105  Temp(Src) 98.4 F (36.9 C) (Oral)  Resp 18  SpO2 97%  Physical Exam  Nursing note and vitals reviewed. Constitutional: He is oriented to person, place, and time. He appears well-developed and well-nourished. No distress.  HENT:  Head: Normocephalic and atraumatic.  Eyes: Conjunctivae are normal.  Neck: Normal range of motion.  Cardiovascular: Normal rate and regular rhythm.  Exam reveals no gallop and no friction rub.   No murmur heard. Pulmonary/Chest: Effort normal and breath sounds normal. He has no wheezes. He has no rales. He exhibits no tenderness.  Abdominal: Soft. There is no tenderness.  Musculoskeletal: Normal range of motion.  Neurological: He is alert and oriented to person, place, and time.  Coordination normal.  Speech is goal-oriented. Moves limbs without ataxia.   Skin: Skin is warm and dry.  Psychiatric: He has a normal mood and affect. His behavior is normal.    ED Course  Procedures (including critical care time)  Labs Reviewed - No data to display Dg Chest 2 View  07/15/2012  *RADIOLOGY REPORT*  Clinical Data: Weakness  CHEST - 2 VIEW  Comparison: 04/27/2012  Findings: Heart size upper limits of normal.  Diffusely increased interstitial lung markings noted, without focal pulmonary opacity. No new focal opacity.  No pleural effusion.  No acute osseous abnormality.  IMPRESSION: Chronically prominent interstitial markings without new focal pulmonary opacity.   Original Report Authenticated By: Christiana Pellant, M.D.      No diagnosis found.    MDM   11:16 PM Patient has returned for the same complaint after his discharge less than 1 hour ago. Patient ambulated without difficulty when discharged earlier this evening. No new symptoms. No further work up required. Patient will be ambulated in the hall. If successful, patient will be discharged.   12:22 AM Patient able to ambulate. Patient will move to Pod C and social work will see him in the morning.   Patient signed out to Dr. Lavella Lemons.     Emilia Beck, PA-C 07/16/12 [phone]

## 2012-07-16 MED ORDER — CLOPIDOGREL BISULFATE 75 MG PO TABS
75.0000 mg | ORAL_TABLET | Freq: Every day | ORAL | Status: DC
  Administered 2012-07-17: 75 mg via ORAL
  Filled 2012-07-16: qty 1

## 2012-07-16 MED ORDER — ADULT MULTIVITAMIN W/MINERALS CH
1.0000 | ORAL_TABLET | Freq: Every day | ORAL | Status: DC
  Administered 2012-07-16: 1 via ORAL
  Filled 2012-07-16: qty 1

## 2012-07-16 MED ORDER — AMLODIPINE BESYLATE 10 MG PO TABS
10.0000 mg | ORAL_TABLET | Freq: Every day | ORAL | Status: DC
  Administered 2012-07-16: 10 mg via ORAL
  Filled 2012-07-16 (×2): qty 1

## 2012-07-16 MED ORDER — LOPERAMIDE HCL 2 MG PO CAPS
2.0000 mg | ORAL_CAPSULE | Freq: Once | ORAL | Status: AC
  Administered 2012-07-16: 2 mg via ORAL
  Filled 2012-07-16: qty 1

## 2012-07-16 MED ORDER — LISINOPRIL 10 MG PO TABS
5.0000 mg | ORAL_TABLET | Freq: Every day | ORAL | Status: DC
  Administered 2012-07-16: 5 mg via ORAL
  Filled 2012-07-16: qty 1

## 2012-07-16 MED ORDER — GABAPENTIN 100 MG PO CAPS
100.0000 mg | ORAL_CAPSULE | Freq: Three times a day (TID) | ORAL | Status: DC
  Administered 2012-07-16 (×2): 100 mg via ORAL
  Filled 2012-07-16 (×5): qty 1

## 2012-07-16 MED ORDER — AMLODIPINE BESYLATE 5 MG PO TABS
5.0000 mg | ORAL_TABLET | Freq: Once | ORAL | Status: AC
  Administered 2012-07-16: 5 mg via ORAL
  Filled 2012-07-16: qty 1

## 2012-07-16 NOTE — ED Notes (Signed)
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 Foot. Due to a previous stroke

## 2012-07-16 NOTE — ED Notes (Signed)
Pt. Had 2 episodes of diarrhea , medicated per orders

## 2012-07-16 NOTE — ED Provider Notes (Signed)
Medical screening examination/treatment/procedure(s) were performed by non-physician practitioner and as supervising physician I was immediately available for consultation/collaboration.   Dione Booze, MD 07/16/12 [phone]

## 2012-07-16 NOTE — ED Notes (Signed)
Social worker in speaking with pt.

## 2012-07-16 NOTE — ED Notes (Signed)
Discussed case with nursing and ACT Team.  Pt not meeting criteria for detox, but may be appropriate for rehab.  Called [facility] to check on the availability of rehab beds and am awaiting a return call.  If no [facility] beds available, recommendation is for pt to be d/c with list of residential rehab facilities for him to f/u with on his own.  Pt to stay in ED this pm because he continues to have diarrhea.  PT consult is pending, but it is doubtful that pt would be a candidate for placement in either a NH or ALF since he has no payor source or no income to private pay for his care.  Additionally, pt probably would not be a candidate for a [facility] LOG as he has been noncompliant with Medicaid application/f/u.  Day shift CSW to f/u in am.

## 2012-07-16 NOTE — ED Notes (Signed)
Pt.  Continues to have diarrhea , reported to Dr. Manus Gunning.  Pt. Reports having that he has gone 2 times dinner.

## 2012-07-16 NOTE — ED Notes (Signed)
Breakfast tray delivered

## 2012-07-16 NOTE — Progress Notes (Signed)
CSW met with Pt to discuss consult referral. Pt is currently homeless and would like to continue ETOH rehab. Pt had recently went to [place] and completed a 30 day but would like to continue tx at another location.   Pt mentioned SNF however Pt has no insurance. On 05/21/2012 Pt was assisted with completing a Medicaid Application and did not complete the process because he was not willing to comply with the conditions of the assistance.   Pt stated he can not go back to [facility] at this time. CSW will check into that facility and other shelters if needed.   CSW contacted [facility] and they do ave male detox beds available and requested CSW send clinical information.   Report will be given to evening CSW for follow up.   Leron Croak, LCSWA [facility] Emergency Dept.  [phone]

## 2012-07-16 NOTE — ED Notes (Signed)
Rehab referral faxed to [facility] at [facility].  Await decision on pt's acceptance.  ACT aware of referral and agreeable to take call from [facility] should they call during the night.  RN informed as well.

## 2012-07-17 LAB — URINE CULTURE: Colony Count: NO GROWTH

## 2012-07-17 NOTE — Progress Notes (Signed)
PT Note  Called by nursing to let me know that pt now wants rollator walker liked we used earlier.  Pt earlier had refused rollator due to difficulty getting on the bus.  Agree rollator would increase pt's safety with amb.  [employer] PT [phone]

## 2012-07-17 NOTE — ED Notes (Signed)
Case manager made aware of request

## 2012-07-17 NOTE — ED Notes (Signed)
Waiting for [facility] to bring walker

## 2012-07-17 NOTE — ED Provider Notes (Signed)
Patient stable, throughout the night, seen by social work, given [employer], patient has past, phone numbers, stable for discharge.  Vida Roller, MD 07/17/12 1054

## 2012-07-17 NOTE — ED Notes (Signed)
Physical therapy notified- because pt would like "the walker with the basket-- I am not going to be able to carry my very heavy bags around and I think that the walker with the basket would help."

## 2012-07-17 NOTE — ED Notes (Signed)
Pt. States diarrhea has improved. Pt. Only went to the bathroom once throughout the night.

## 2012-07-17 NOTE — ED Notes (Signed)
Physical Therapist at the bedside.

## 2012-07-17 NOTE — ED Notes (Signed)
Waiting for [facility] to bring walker 

## 2012-07-17 NOTE — Evaluation (Signed)
Physical Therapy Evaluation Patient Details Name: [name] [name] MRN: [id number] DOB: 1950-10-09 Today's Date: 07/17/2012 Time: 1914-7829 PT Time Calculation (min): 35 min  PT Assessment / Plan / Recommendation Clinical Impression  Pt presents to PT with unsteady gait due to some ataxia and rt leg weakness.  These likely due to long term alcholism and CVA.  Pt's gait was improved with rolling walker or rollator but pt didn't think either would be good option for him due to his riding the bus. He didn't feel he could manage either getting on/off the bus. Pt will always be a fall risk due to his history. Pt could benefit from f/u PT at home but due to pt's social situation this will not  be practical.    PT Assessment  Patient needs continued PT services    Follow Up Recommendations  No PT follow up    Does the patient have the potential to tolerate intense rehabilitation      Barriers to Discharge Other (comment) homeless    Equipment Recommendations  None recommended by PT (See above clinical impression.)    Recommendations for Other Services     Frequency Min 3X/week    Precautions / Restrictions Precautions Precautions: Fall   Pertinent Vitals/Pain N/A      Mobility  Transfers Transfers: Sit to Stand;Stand to Sit Sit to Stand: 6: Modified independent (Device/Increase time);Without upper extremity assist;From bed Stand to Sit: 6: Modified independent (Device/Increase time);Without upper extremity assist;To bed Details for Transfer Assistance: Pt uses bed to brace the backs of his legs as he rises to standing.  Pt keeps hands in lap while doing this. Ambulation/Gait Ambulation/Gait Assistance: 5: Supervision;4: Min assist Ambulation Distance (Feet): 500 Feet Assistive device: None;Rolling walker;Rollator Ambulation/Gait Assistance Details: Pt with  some posterior staggering requiring min A to regain balance when not using assistive device. Pt's gait steadier when  using rolling walker or rollator. Gait Pattern: Step-through pattern;Decreased step length - left;Decreased step length - right;Narrow base of support;Ataxic Gait velocity: decr    Exercises     PT Diagnosis: Difficulty walking  PT Problem List: Decreased strength;Decreased balance PT Treatment Interventions: Gait training;Functional mobility training;Balance training;Patient/family education   PT Goals Acute Rehab PT Goals PT Goal Formulation: With patient Time For Goal Achievement: 07/19/12 Potential to Achieve Goals: Fair Pt will Ambulate: 51 - 150 feet;with modified independence;with least restrictive assistive device PT Goal: Ambulate - Progress: Goal set today Pt will Go Up / Down Stairs: 1-2 stairs;with rail(s);with supervision PT Goal: Up/Down Stairs - Progress: Goal set today  Visit Information  Last PT Received On: 07/17/12 Assistance Needed: +1    Subjective Data  Subjective: 'That won't work. I can't take it on the bus," pt stated about rollator. Patient Stated Goal: Be able to get on/off of bus.   Prior Functioning  Home Living Lives With: Alone Type of Home: Homeless Home Adaptive Equipment: None Prior Function Able to Take Stairs?: Yes (reports this is difficult) Communication Communication: No difficulties    Cognition  Cognition Arousal/Alertness: Awake/alert Behavior During Therapy: WFL for tasks assessed/performed Overall Cognitive Status: Within Functional Limits for tasks assessed    Extremity/Trunk Assessment Right Lower Extremity Assessment RLE ROM/Strength/Tone: Deficits RLE ROM/Strength/Tone Deficits: grossly 4-/5 RLE Sensation: Deficits;History of peripheral neuropathy RLE Sensation Deficits: Reports tingling in leg Left Lower Extremity Assessment LLE ROM/Strength/Tone: WFL for tasks assessed LLE Sensation: Deficits;History of peripheral neuropathy LLE Sensation Deficits: Reports tingling in leg   Balance Balance Balance Assessed:  Yes Static Standing  Balance Static Standing - Balance Support: No upper extremity supported Static Standing - Level of Assistance: 5: Stand by assistance  End of Session PT - End of Session Equipment Utilized During Treatment: Other (comment) (used pt's belt as gait belt) Activity Tolerance: Patient tolerated treatment well Patient left: Other (comment) (sitting on stretcher.) Nurse Communication: Mobility status  GP Functional Assessment Tool Used: clinical judgement Functional Limitation: Mobility: Walking and moving around Mobility: Walking and Moving Around Current Status ([id number]): At least 1 percent but less than 20 percent impaired, limited or restricted Mobility: Walking and Moving Around Goal Status [phone]): 0 percent impaired, limited or restricted   [facility] 07/17/2012, 11:42 AM  [facility] PT [phone]

## 2012-07-22 ENCOUNTER — Emergency Department (HOSPITAL_COMMUNITY): Payer: Self-pay

## 2012-07-22 ENCOUNTER — Emergency Department (HOSPITAL_COMMUNITY)
Admission: EM | Admit: 2012-07-22 | Discharge: 2012-07-23 | Disposition: A | Discharge status: Home / Self Care | Payer: Self-pay | Attending: Emergency Medicine | Admitting: Emergency Medicine

## 2012-07-22 ENCOUNTER — Encounter (HOSPITAL_COMMUNITY): Payer: Self-pay | Admitting: Emergency Medicine

## 2012-07-22 DIAGNOSIS — Z8673 Personal history of transient ischemic attack (TIA), and cerebral infarction without residual deficits: Secondary | ICD-10-CM | POA: Insufficient documentation

## 2012-07-22 DIAGNOSIS — Z5900 Homelessness unspecified: Secondary | ICD-10-CM | POA: Insufficient documentation

## 2012-07-22 DIAGNOSIS — F329 Major depressive disorder, single episode, unspecified: Secondary | ICD-10-CM

## 2012-07-22 DIAGNOSIS — Z79899 Other long term (current) drug therapy: Secondary | ICD-10-CM | POA: Insufficient documentation

## 2012-07-22 DIAGNOSIS — Z8669 Personal history of other diseases of the nervous system and sense organs: Secondary | ICD-10-CM | POA: Insufficient documentation

## 2012-07-22 DIAGNOSIS — R259 Unspecified abnormal involuntary movements: Secondary | ICD-10-CM | POA: Insufficient documentation

## 2012-07-22 DIAGNOSIS — E7800 Pure hypercholesterolemia, unspecified: Secondary | ICD-10-CM | POA: Insufficient documentation

## 2012-07-22 DIAGNOSIS — R45851 Suicidal ideations: Secondary | ICD-10-CM

## 2012-07-22 DIAGNOSIS — F1010 Alcohol abuse, uncomplicated: Secondary | ICD-10-CM | POA: Insufficient documentation

## 2012-07-22 DIAGNOSIS — R5381 Other malaise: Secondary | ICD-10-CM | POA: Insufficient documentation

## 2012-07-22 DIAGNOSIS — F17200 Nicotine dependence, unspecified, uncomplicated: Secondary | ICD-10-CM | POA: Insufficient documentation

## 2012-07-22 DIAGNOSIS — I10 Essential (primary) hypertension: Secondary | ICD-10-CM | POA: Insufficient documentation

## 2012-07-22 DIAGNOSIS — F32A Depression, unspecified: Secondary | ICD-10-CM

## 2012-07-22 DIAGNOSIS — R002 Palpitations: Secondary | ICD-10-CM

## 2012-07-22 DIAGNOSIS — F3289 Other specified depressive episodes: Secondary | ICD-10-CM | POA: Insufficient documentation

## 2012-07-22 LAB — COMPREHENSIVE METABOLIC PANEL
ALT: 66 U/L — ABNORMAL HIGH (ref 0–53)
AST: 59 U/L — ABNORMAL HIGH (ref 0–37)
Albumin: 3.8 g/dL (ref 3.5–5.2)
CO2: 30 mEq/L (ref 19–32)
Chloride: 100 mEq/L (ref 96–112)
GFR calc non Af Amer: >90 mL/min (ref >90)
Sodium: 139 mEq/L (ref 135–145)
Total Bilirubin: 0.5 mg/dL (ref 0.3–1.2)

## 2012-07-22 LAB — URINALYSIS, ROUTINE W REFLEX MICROSCOPIC
Bilirubin Urine: NEGATIVE
Glucose, UA: 100 mg/dL — AB
Hgb urine dipstick: NEGATIVE
Specific Gravity, Urine: 1.023 (ref 1.005–1.030)
Urobilinogen, UA: 0.2 mg/dL (ref 0.0–1.0)

## 2012-07-22 LAB — LIPASE, BLOOD: Lipase: 21 U/L (ref 11–59)

## 2012-07-22 LAB — CBC WITH DIFFERENTIAL/PLATELET
Basophils Absolute: 0 10*3/uL (ref 0.0–0.1)
Lymphocytes Relative: 20 % (ref 12–46)
Neutro Abs: 3.2 10*3/uL (ref 1.7–7.7)
Neutrophils Relative %: 64 % (ref 43–77)
Platelets: 217 10*3/uL (ref 150–400)
RDW: 14.1 % (ref 11.5–15.5)
WBC: 5 10*3/uL (ref 4.0–10.5)

## 2012-07-22 LAB — POCT I-STAT TROPONIN I

## 2012-07-22 LAB — CK: Total CK: 144 U/L (ref 7–232)

## 2012-07-22 MED ORDER — IBUPROFEN 800 MG PO TABS
800.0000 mg | ORAL_TABLET | Freq: Once | ORAL | Status: AC
  Administered 2012-07-22: 800 mg via ORAL
  Filled 2012-07-22: qty 1

## 2012-07-22 MED ORDER — VITAMIN B-1 100 MG PO TABS
100.0000 mg | ORAL_TABLET | Freq: Once | ORAL | Status: AC
  Administered 2012-07-22: 100 mg via ORAL
  Filled 2012-07-22: qty 1

## 2012-07-22 MED ORDER — LISINOPRIL 10 MG PO TABS
5.0000 mg | ORAL_TABLET | Freq: Once | ORAL | Status: AC
  Administered 2012-07-22: 5 mg via ORAL
  Filled 2012-07-22: qty 1

## 2012-07-22 MED ORDER — ADULT MULTIVITAMIN W/MINERALS CH
1.0000 | ORAL_TABLET | Freq: Once | ORAL | Status: AC
  Administered 2012-07-22: 1 via ORAL
  Filled 2012-07-22: qty 1

## 2012-07-22 MED ORDER — AMLODIPINE BESYLATE 10 MG PO TABS
10.0000 mg | ORAL_TABLET | Freq: Once | ORAL | Status: AC
  Administered 2012-07-22: 10 mg via ORAL
  Filled 2012-07-22: qty 1

## 2012-07-22 MED ORDER — FOLIC ACID 1 MG PO TABS
1.0000 mg | ORAL_TABLET | Freq: Once | ORAL | Status: AC
  Administered 2012-07-22: 1 mg via ORAL
  Filled 2012-07-22: qty 1

## 2012-07-22 MED ORDER — SODIUM CHLORIDE 0.9 % IV BOLUS (SEPSIS)
1000.0000 mL | Freq: Once | INTRAVENOUS | Status: AC
  Administered 2012-07-22: 1000 mL via INTRAVENOUS

## 2012-07-22 NOTE — ED Notes (Signed)
ACT team at bedside.  

## 2012-07-22 NOTE — ED Provider Notes (Signed)
History     CSN: [id number]  Arrival date & time 07/22/12  1530   First MD Initiated Contact with Patient 07/22/12 1554      Chief Complaint  Patient presents with  . Palpitations    (Consider location/radiation/quality/duration/timing/severity/associated sxs/prior treatment) HPI Comments: Patient is a 62 year old male with a history of hypertension, stroke in October 2011, alcohol abuse, and homelessness who presents for palpitations and shaking in his "whole body". Patient states that symptom onset was 3 hours ago and it they have been constant without any aggravating or alleviating factors. Patient states that he felt his radial pulse and it felt stronger to him than usual. The patient admits to associated generalized weakness, denying inability to ambulate, falls, or loss of consciousness. Patient denies fever, tinnitus, difficulty speaking or swallowing, chest pain, shortness of breath, abdominal pain, vomiting or diarrhea, and numbness or tingling in his extremities. Patient admits to medication noncompliance, specifically of his Norvasc and Plavix. Patient also admits to drinking 2 pints of mouthwash a day to get drunk and that drinking mouthwash alleviates this shaking sensation. Patient was worked up one week ago for symptoms of generalized weakness; w/u was unremarkable.  Patient is a 62 y.o. male presenting with palpitations. The history is provided by the patient. No language interpreter was used.  Palpitations  Associated symptoms include weakness. Pertinent negatives include no fever, no chest pain, no abdominal pain, no vomiting and no shortness of breath.    Past Medical History  Diagnosis Date  . Hypercholesteremia   . Stroke   . Hypertension   . Alcohol abuse   . Seizures   . Depression   . Homelessness     History reviewed. No pertinent past surgical history.  History reviewed. No pertinent family history.  History  Substance Use Topics  . Smoking status:  Current Every Day Smoker -- 0.25 packs/day    Types: Cigarettes  . Smokeless tobacco: Not on file  . Alcohol Use: Yes     Comment: drinks bottles of 2 pint  mouthwash daily      Review of Systems  Constitutional: Negative for fever.  Eyes: Negative for visual disturbance.  Respiratory: Negative for chest tightness and shortness of breath.   Cardiovascular: Positive for palpitations. Negative for chest pain.  Gastrointestinal: Negative for vomiting, abdominal pain and diarrhea.  Skin: Negative for color change, pallor and rash.  Neurological: Positive for tremors and weakness. Negative for syncope.  Psychiatric/Behavioral: Negative for confusion.  All other systems reviewed and are negative.    Allergies  Review of patient's allergies indicates no known allergies.  Home Medications   Current Outpatient Rx  Name  Route  Sig  Dispense  Refill  . amLODipine (NORVASC) 10 MG tablet   Oral   Take 10 mg by mouth daily.         . clopidogrel (PLAVIX) 75 MG tablet   Oral   Take 75 mg by mouth daily with breakfast.         . gabapentin (NEURONTIN) 100 MG capsule   Oral   Take 1 capsule (100 mg total) by mouth 3 (three) times daily.   90 capsule   0   . lisinopril (PRINIVIL,ZESTRIL) 5 MG tablet   Oral   Take 5 mg by mouth daily.         . Multiple Vitamin (MULTIVITAMIN WITH MINERALS) TABS   Oral   Take 1 tablet by mouth daily.         Marland Kitchen  Thiamine HCl (VITAMIN B-1 PO)   Oral   Take 1 tablet by mouth daily.           BP 176/100  Pulse 101  Temp(Src) 98 F (36.7 C) (Oral)  Resp 18  SpO2 100%  Physical Exam  Nursing note and vitals reviewed. Constitutional: He is oriented to person, place, and time. No distress.  Patient speaks and oriented sentences without aphasia or dysarthria. Patient well and nontoxic appearing, in no acute distress.  HENT:  Head: Normocephalic and atraumatic.  Mouth/Throat: Oropharynx is clear and moist. No oropharyngeal exudate.   Symmetric rise of the uvula with phonation  Eyes: Conjunctivae and EOM are normal. Pupils are equal, round, and reactive to light. Right eye exhibits no discharge. Left eye exhibits no discharge. No scleral icterus.  Neck: Normal range of motion. Neck supple.  Cardiovascular: Normal heart sounds.   Rate tachycardic, rhythm and heart sounds normal. Distal radial and posterior tibial pulses 2+ bilaterally  Pulmonary/Chest: Effort normal and breath sounds normal. No respiratory distress. He has no wheezes. He has no rales.  Abdominal: Soft. He exhibits no distension and no mass. There is no tenderness. There is no rebound and no guarding.  Musculoskeletal: Normal range of motion. He exhibits no edema.  Lymphadenopathy:    He has no cervical adenopathy.  Neurological: He is alert and oriented to person, place, and time.  Cranial nerves II through XII grossly intact. Patient exhibited equal grip strength bilaterally 5 out of 5 strength against resistance in his upper and lower extremities. DTRs normal and symmetric. No sensory or motor deficits appreciated. Patient is able to pick up a dime off a flat surface with his right and left hand.  Skin: Skin is warm and dry. No rash noted. He is not diaphoretic. No erythema.  Psychiatric: He has a normal mood and affect. His behavior is normal.    ED Course  Procedures (including critical care time)  Labs Reviewed  CBC WITH DIFFERENTIAL - Abnormal; Notable for the following:    RBC 3.55 (*)    Hemoglobin 12.3 (*)    HCT 35.3 (*)    MCH 34.6 (*)    Monocytes Relative 13 (*)    All other components within normal limits  COMPREHENSIVE METABOLIC PANEL - Abnormal; Notable for the following:    Glucose, Bld 101 (*)    AST 59 (*)    ALT 66 (*)    All other components within normal limits  URINALYSIS, ROUTINE W REFLEX MICROSCOPIC - Abnormal; Notable for the following:    pH 8.5 (*)    Glucose, UA 100 (*)    All other components within normal limits   ETHANOL  CK  LIPASE, BLOOD  POCT I-STAT TROPONIN I   Dg Chest 2 View  07/22/2012  *RADIOLOGY REPORT*  Clinical Data: Back pain, hypertension, shortness of breath  CHEST - 2 VIEW  Comparison: 07/15/2012  Findings: Lungs clear.  Heart size and pulmonary vascularity normal.  No effusion.  Visualized bones unremarkable.  IMPRESSION: No acute disease   Original Report Authenticated By: D. Andria Rhein, MD      Date: 07/22/2012  Rate: 114  Rhythm: sinus tachycardia  QRS Axis: left (borderline)  Intervals: normal  ST/T Wave abnormalities: normal  Conduction Disutrbances:none  Narrative Interpretation: Sinus tachycardia without STEMI or ischemic changes  Old EKG Reviewed: unchanged from 05/23/2012 I have personally reviewed and interpreted his EKG   1. Palpitations   2. Depression   3. Suicidal  ideation      MDM  Patient with a history of hypertension, stroke in October 2011, alcohol abuse, and homelessness who presents for palpitations and shaking in his "whole body". Physical exam significant for tachycardia with no other abnormalities noted. W/u to include CBC, CMP, CK, troponin, lipase, ethanol, CXR, and EKG. MVI, folate, and thiamine ordered for symptoms as well as BP meds as patient admits to noncompliance. Patient well and nontoxic appearing, in NAD.  Patient recently began stating "it's not worth living anymore" and "I don't have anything to live for"; endorses suicidal ideation without plan. Patient has hx of depression that is currently not being treated. Have consulted ACT and telepsych for evaluation of suicidal ideation. Labs c/w prior work ups, EKG unchanged from prior, and CXR without evidence of PNA, PTX, or pleural effusion. Patient's BP trending down with PO BP meds. Based on ED work up, patient medically cleared.  Patient seen by ACT team member who states patient interested in going to an alcohol treatment facility. Patient accepted by [facility] for treatment. Social work to  coordinate transportation in AM. Needs 5 day supply of Neurontin for transfer; consult placed to care management.  Patient signed out to Dr. Azalia Bilis at end of shift  Antony Madura, PA-C 07/23/12 2328

## 2012-07-22 NOTE — ED Notes (Signed)
Pt to ED via EMS with c/o palpitation. Pt states he checked his pulse and was bounding. Pt denies chest pain. Pt is homeless. Per EMS BP-162/110, pulse-118.

## 2012-07-22 NOTE — ED Notes (Signed)
telepsych being completed now.

## 2012-07-23 MED ORDER — FOLIC ACID 1 MG PO TABS: 1.0000 mg | ORAL_TABLET | Freq: Once | ORAL | Status: DC

## 2012-07-23 MED ORDER — ONDANSETRON HCL 4 MG PO TABS: 4.0000 mg | ORAL_TABLET | Freq: Three times a day (TID) | ORAL | Status: DC | PRN

## 2012-07-23 MED ORDER — GABAPENTIN 800 MG PO TABS: 800.0000 mg | ORAL_TABLET | Freq: Four times a day (QID) | ORAL | Status: DC

## 2012-07-23 MED ORDER — LORAZEPAM 1 MG PO TABS: 1.0000 mg | ORAL_TABLET | Freq: Three times a day (TID) | ORAL | Status: DC | PRN

## 2012-07-23 MED ORDER — ADULT MULTIVITAMIN W/MINERALS CH: 1.0000 | ORAL_TABLET | Freq: Once | ORAL | Status: DC

## 2012-07-23 MED ORDER — ALUM & MAG HYDROXIDE-SIMETH 200-200-20 MG/5ML PO SUSP: 30.0000 mL | ORAL | Status: DC | PRN

## 2012-07-23 MED ORDER — AMLODIPINE BESYLATE 10 MG PO TABS: 10.0000 mg | ORAL_TABLET | Freq: Once | ORAL | Status: DC

## 2012-07-23 MED ORDER — ZOLPIDEM TARTRATE 5 MG PO TABS: 5.0000 mg | ORAL_TABLET | Freq: Every evening | ORAL | Status: DC | PRN

## 2012-07-23 MED ORDER — NICOTINE 21 MG/24HR TD PT24
21.0000 mg | MEDICATED_PATCH | Freq: Every day | TRANSDERMAL | Status: DC
  Filled 2012-07-23: qty 1

## 2012-07-23 MED ORDER — ACETAMINOPHEN 325 MG PO TABS: 650.0000 mg | ORAL_TABLET | ORAL | Status: DC | PRN

## 2012-07-23 MED ORDER — IBUPROFEN 200 MG PO TABS: 600.0000 mg | ORAL_TABLET | Freq: Three times a day (TID) | ORAL | Status: DC | PRN

## 2012-07-23 MED ORDER — LISINOPRIL 5 MG PO TABS: 5.0000 mg | ORAL_TABLET | Freq: Once | ORAL | Status: DC

## 2012-07-23 NOTE — Progress Notes (Signed)
   CARE MANAGEMENT ED NOTE 07/23/2012  Patient:  [name] [name],[name] [name]   Account Number:  [id number]  Date Initiated:  07/23/2012  Documentation initiated by:  Fransico Michael  Subjective/Objective Assessment:   presented to ED with c/o palpitations and vague suicidal ideation with no plan     Subjective/Objective Assessment Detail:     Action/Plan:   requires medication assistance for [facility]   Action/Plan Detail:   Anticipated DC Date:  07/23/2012     Status Recommendation to Physician:   Result of Recommendation:      DC Planning Services  CM consult  MATCH Program    Choice offered to / List presented to:            Status of service:    ED Comments:   ED Comments Detail:  07/23/12-1133-J.Kuper Rennels,RN,BSN [phone]      Patient discharged. Ambulating with use of walker to bus stop. MATCH letter and prescriptions with patient. Carol at [facility] notified of paitent's discharge and MATCH enrollment.  07/23/12-1047-J.Azure Barrales,RN,BSN [phone]      Spoke with Nita Sells assistant director regarding MATCH overide for neurontin for [facility] stay.  07/23/12-1039-J.Izreal Kock,RN,BSN [phone]      Patient was granted [facility] access in February of 2014. CM unsure of overide possibility for neurontin. Will contact AD of case management for direction.  07/23/12-1023-J.Ovidio Steele,RN,BSN [phone]      In to speak with patient regarding medications and disposition to [facility]. Patient has medications with him and reports that a "federal marshall that helps to look after me gets my medications filled for me." Patient showed RNCM med bottles. Seems to have enough medications for [facility] stay except for neurontin. CM will see about getting patient enough neurontin for [facility] stay. Also spoke in length to patient regarding desire to go to [facility] and steps he will have to take to get there today. Patient [place] have to take [employer] bus to depot station (runs every 30 minutes) then an [employer] train at approx. 1335 from main depot in  [place] to [place] station where [facility] staff will pick him up. Patient seems anxious about train ride. States, "I have never been on a train before, can I take my walker?" Reassured patient about process. Plan to devised along with patient input for him to eat lunch at 1100 here in ED then start making his way to the bus stop out front to catch the bus. Plan is for patient to arrive at main depot at approx 1230 which will give him 45 mins to make his way to the train terminal. Voiced agreement. Plan shared with Wendy,EDRN.  07/23/12-0907-J.Michi Herrmann,RN,BSN [phone]      Spoke with Okey Regal at [facility] [phone]) regarding patient needs while at [facility]. She reports patient needing approximately 5 days worth of medications. CM will check with patient regarding supply. CM also explained MATCH process to Okey Regal to see if patient is elligible if [facility] can get prescriptions filled with [facility] letter. Voiced agreement with that and states, they could get his prescription filled at [facility] if needed.  07/23/12-0847-J.Lether Tesch,RN,BSN [phone]     Received referral from Wendy,EDRN regarding potential medication needs prior to disposition to [facility].

## 2012-07-23 NOTE — BH Assessment (Signed)
Assessment Note   [name] [name] is an 62 y.o. male.  Pt came to [facility] via EMS after experiencing fever/chills and tremors.  Patient has been medically cleared.  Patient expressed some feelings of depression about his life.  He said that he is tired of being homeless as his has been for the last eight years.  Patient denies any current SI, HI or A/V hallucinations.  Patient says that he is tired of things and says, "I feel like I am at wits end."  Patient was at [facility] from March 8 to April 8 of this year.  He reports that he started drinking shortly after he was discharged.  He usually drinks 2 pints of mouthwash per day.  Last drink was on 05/06 and was two ounces.  Patient currently wants detox. Clinician sent patient information to [facility] for review.  Andrey Campanile there said that they would take him but that he must have 5 days supply of his medications with him.  This clinician spoke to patient and he said that he had that much of his meds with the exception of his neurontin.  Clinician spoke to [place] and she said that case management can assist patient with this request in the morning.  Social work will also assist with transportation to [facility] from [facility] in the AM. Axis I: 303.90  ETOH dependence Axis II: Deferred Axis III:  Past Medical History  Diagnosis Date  . Hypercholesteremia   . Stroke   . Hypertension   . Alcohol abuse   . Seizures   . Depression   . Homelessness    Axis IV: economic problems, housing problems, occupational problems, other psychosocial or environmental problems and problems with primary support group Axis V: 31-40 impairment in reality testing  Past Medical History:  Past Medical History  Diagnosis Date  . Hypercholesteremia   . Stroke   . Hypertension   . Alcohol abuse   . Seizures   . Depression   . Homelessness     History reviewed. No pertinent past surgical history.  Family History: History reviewed. No pertinent family history.  Social History:   reports that he has been smoking Cigarettes.  He has been smoking about 0.25 packs per day. He does not have any smokeless tobacco history on file. He reports that  drinks alcohol. He reports that he does not use illicit drugs.  Additional Social History:  Alcohol / Drug Use Pain Medications: See PTA list of meds Prescriptions: See medications Prior To Arrival list Over the Counter: See PTA medication list History of alcohol / drug use?: Yes Negative Consequences of Use: Personal relationships Withdrawal Symptoms: Cramps;Fever / Chills;Nausea / Vomiting;Patient aware of relationship between substance abuse and physical/medical complications;Sweats;Tingling;Tremors;Weakness Substance #1 Name of Substance 1: ETOH 1 - Age of First Use: 62 years of age 57 - Amount (size/oz): 2 pints mouthwash 1 - Frequency: Daily consumption 1 - Duration: One month (started drinking again shortly after d/c from [facility] on 06/24/12. 1 - Last Use / Amount: 05/06  Roughly two ounces of mouthwash  CIWA: CIWA-Ar BP: 176/100 mmHg Pulse Rate: 101 Nausea and Vomiting: no nausea and no vomiting Tactile Disturbances: very mild itching, pins and needles, burning or numbness Tremor: not visible, but can be felt fingertip to fingertip Auditory Disturbances: not present Paroxysmal Sweats: barely perceptible sweating, palms moist Visual Disturbances: not present Anxiety: mildly anxious Headache, Fullness in Head: none present Agitation: somewhat more than normal activity Orientation and Clouding of Sensorium: oriented and can  do serial additions CIWA-Ar Total: 5 COWS:    Allergies: No Known Allergies  Home Medications:  (Not in a hospital admission)  OB/GYN Status:  No LMP for male patient.  General Assessment Data Location of Assessment: [facility] ED Living Arrangements: Other (Comment) (Homeless in [place]) Can pt return to current living arrangement?: Yes Admission Status: Voluntary Is patient capable of  signing voluntary admission?: Yes Transfer from: Acute Hospital Referral Source: Self/Family/Friend     Risk to self Suicidal Ideation: No Suicidal Intent: No Is patient at risk for suicide?: No Suicidal Plan?: No Access to Means: No What has been your use of drugs/alcohol within the last 12 months?: Daily use of ETOH Previous Attempts/Gestures: No How many times?: 0 Other Self Harm Risks: SA issues Triggers for Past Attempts: None known Intentional Self Injurious Behavior: None Family Suicide History: No Recent stressful life event(s): Other (Comment) (Homelessness) Persecutory voices/beliefs?: No Depression: Yes Depression Symptoms: Despondent;Feeling worthless/self pity;Fatigue;Insomnia Substance abuse history and/or treatment for substance abuse?: Yes Suicide prevention information given to non-admitted patients: Not applicable  Risk to Others Homicidal Ideation: No Thoughts of Harm to Others: No Current Homicidal Intent: No Current Homicidal Plan: No Access to Homicidal Means: No Identified Victim: No one History of harm to others?: No Assessment of Violence: None Noted Violent Behavior Description: Pt cooperative and calm Does patient have access to weapons?: No Criminal Charges Pending?: No Does patient have a court date: No  Psychosis Hallucinations: None noted Delusions: None noted  Mental Status Report Appear/Hygiene: Disheveled;Body odor;Poor hygiene Eye Contact: Good Motor Activity: Freedom of movement;Restlessness Speech: Logical/coherent Level of Consciousness: Alert Mood: Anxious;Helpless Affect: Anxious Anxiety Level: Minimal Thought Processes: Relevant;Coherent Judgement: Unimpaired Orientation: Person;Place;Situation;Appropriate for developmental age Obsessive Compulsive Thoughts/Behaviors: None  Cognitive Functioning Concentration: Decreased Memory: Recent Intact;Remote Intact IQ: Average Insight: Fair Impulse Control: Poor Appetite:  Fair Weight Loss: 0 Weight Gain: 0 Sleep: Decreased Total Hours of Sleep:  (<4H/D) Vegetative Symptoms: Decreased grooming  ADLScreening [facility] Assessment Services) Patient's cognitive ability adequate to safely complete daily activities?: Yes Patient able to express need for assistance with ADLs?: Yes Independently performs ADLs?: Yes (appropriate for developmental age)  Abuse/Neglect [facility]) Physical Abuse: Denies Verbal Abuse: Denies Sexual Abuse: Denies  Prior Inpatient Therapy Prior Inpatient Therapy: Yes Prior Therapy Dates: March 8 to June 24, 2012 Prior Therapy Facilty/Provider(s): [facility] Reason for Treatment: SA tx   Prior Outpatient Therapy Prior Outpatient Therapy: No Prior Therapy Dates: None Prior Therapy Facilty/Provider(s): N/A Reason for Treatment: N/A  ADL Screening (condition at time of admission) Patient's cognitive ability adequate to safely complete daily activities?: Yes Patient able to express need for assistance with ADLs?: Yes Independently performs ADLs?: Yes (appropriate for developmental age) Weakness of Legs: Both (Neuropathy in both legs.  Uses a wheeled walker) Weakness of Arms/Hands: None  Home Assistive Devices/Equipment Home Assistive Devices/Equipment: Environmental consultant (specify type) (Wheeled walker)    Abuse/Neglect Assessment (Assessment to be complete while patient is alone) Physical Abuse: Denies Verbal Abuse: Denies Sexual Abuse: Denies Exploitation of patient/patient's resources: Denies Self-Neglect: Denies Values / Beliefs Cultural Requests During Hospitalization: None Spiritual Requests During Hospitalization: None   Advance Directives (For Healthcare) Advance Directive: Patient does not have advance directive;Patient would not like information    Additional Information 1:1 In Past 12 Months?: No CIRT Risk: No Elopement Risk: No Does patient have medical clearance?: Yes     Disposition:  Disposition Initial Assessment  Completed for this Encounter: Yes Disposition of Patient: Inpatient treatment program;Referred to Type of inpatient  treatment program: Adult Patient referred to: [facility]  On Site Evaluation by:   Reviewed with Physician:  Antony Madura, PA   Beatriz Stallion Ray 07/23/2012 1:11 AM

## 2012-07-23 NOTE — ED Notes (Signed)
Message left with case management re: Rx assistance prior to Transport to [facility]

## 2012-07-23 NOTE — Progress Notes (Signed)
CSW received referral to assist with transportation.  Pt provided with bus pass and train ticket for travel to [facility] in [place].  CSW also provided Pt with a change of clothing as Pt has limited clothing with him at this visit  No further CSW needs identified.  Leron Croak, LCSWA [facility] Emergency Dept.  [phone]

## 2012-07-23 NOTE — Progress Notes (Deleted)
CSW received referral to assist with transportation.  Pt provided with bus pass.  No further CSW needs identified.  Leron Croak, LCSWA [employer] [phone]

## 2012-07-23 NOTE — ED Provider Notes (Signed)
He has been accepted at [facility] and is discharged. Prescriptions are written for his medications for 1 month supply.  Dione Booze, MD 07/23/12 [phone]

## 2012-07-23 NOTE — ED Notes (Signed)
Pt ambulatory to restroom

## 2012-07-25 NOTE — ED Provider Notes (Signed)
Medical screening examination/treatment/procedure(s) were performed by non-physician practitioner and as supervising physician I was immediately available for consultation/collaboration.  Deniz Eskridge L Niralya Ohanian, MD 07/25/12 1522 

## 2012-11-02 ENCOUNTER — Encounter (HOSPITAL_COMMUNITY): Payer: Self-pay | Admitting: Emergency Medicine

## 2012-11-02 DIAGNOSIS — F17200 Nicotine dependence, unspecified, uncomplicated: Secondary | ICD-10-CM | POA: Insufficient documentation

## 2012-11-02 DIAGNOSIS — Z862 Personal history of diseases of the blood and blood-forming organs and certain disorders involving the immune mechanism: Secondary | ICD-10-CM | POA: Insufficient documentation

## 2012-11-02 DIAGNOSIS — Z9119 Patient's noncompliance with other medical treatment and regimen: Secondary | ICD-10-CM | POA: Insufficient documentation

## 2012-11-02 DIAGNOSIS — S0990XA Unspecified injury of head, initial encounter: Secondary | ICD-10-CM | POA: Insufficient documentation

## 2012-11-02 DIAGNOSIS — Z91199 Patient's noncompliance with other medical treatment and regimen due to unspecified reason: Secondary | ICD-10-CM | POA: Insufficient documentation

## 2012-11-02 DIAGNOSIS — Z8673 Personal history of transient ischemic attack (TIA), and cerebral infarction without residual deficits: Secondary | ICD-10-CM | POA: Insufficient documentation

## 2012-11-02 DIAGNOSIS — Z8639 Personal history of other endocrine, nutritional and metabolic disease: Secondary | ICD-10-CM | POA: Insufficient documentation

## 2012-11-02 DIAGNOSIS — Z5900 Homelessness unspecified: Secondary | ICD-10-CM | POA: Insufficient documentation

## 2012-11-02 DIAGNOSIS — F1010 Alcohol abuse, uncomplicated: Secondary | ICD-10-CM | POA: Insufficient documentation

## 2012-11-02 DIAGNOSIS — I10 Essential (primary) hypertension: Secondary | ICD-10-CM | POA: Insufficient documentation

## 2012-11-02 DIAGNOSIS — Z8669 Personal history of other diseases of the nervous system and sense organs: Secondary | ICD-10-CM | POA: Insufficient documentation

## 2012-11-02 DIAGNOSIS — Y9389 Activity, other specified: Secondary | ICD-10-CM | POA: Insufficient documentation

## 2012-11-02 DIAGNOSIS — Y9289 Other specified places as the place of occurrence of the external cause: Secondary | ICD-10-CM | POA: Insufficient documentation

## 2012-11-02 NOTE — ED Notes (Addendum)
PT. ARRIVED WITH EMS FROM STREET , FELL WHILE WALKING / ETOH ( MOUTHWASH)  INTOXICATED , RESPIRATIONS UNLABORED , CBG= 202 BY EMS .

## 2012-11-03 ENCOUNTER — Emergency Department (HOSPITAL_COMMUNITY): Payer: Self-pay

## 2012-11-03 ENCOUNTER — Encounter (HOSPITAL_COMMUNITY): Payer: Self-pay | Admitting: Emergency Medicine

## 2012-11-03 ENCOUNTER — Emergency Department (HOSPITAL_COMMUNITY)
Admission: EM | Admit: 2012-11-03 | Discharge: 2012-11-04 | Disposition: A | Discharge status: Home / Self Care | Payer: Self-pay | Attending: Emergency Medicine | Admitting: Emergency Medicine

## 2012-11-03 ENCOUNTER — Emergency Department (HOSPITAL_COMMUNITY)
Admission: EM | Admit: 2012-11-03 | Discharge: 2012-11-03 | Disposition: A | Discharge status: Home / Self Care | Payer: Self-pay | Attending: Emergency Medicine | Admitting: Emergency Medicine

## 2012-11-03 DIAGNOSIS — W19XXXA Unspecified fall, initial encounter: Secondary | ICD-10-CM

## 2012-11-03 DIAGNOSIS — Z8669 Personal history of other diseases of the nervous system and sense organs: Secondary | ICD-10-CM | POA: Insufficient documentation

## 2012-11-03 DIAGNOSIS — R296 Repeated falls: Secondary | ICD-10-CM | POA: Insufficient documentation

## 2012-11-03 DIAGNOSIS — F1010 Alcohol abuse, uncomplicated: Secondary | ICD-10-CM | POA: Insufficient documentation

## 2012-11-03 DIAGNOSIS — F17200 Nicotine dependence, unspecified, uncomplicated: Secondary | ICD-10-CM | POA: Insufficient documentation

## 2012-11-03 DIAGNOSIS — Y9229 Other specified public building as the place of occurrence of the external cause: Secondary | ICD-10-CM | POA: Insufficient documentation

## 2012-11-03 DIAGNOSIS — Z5900 Homelessness unspecified: Secondary | ICD-10-CM | POA: Insufficient documentation

## 2012-11-03 DIAGNOSIS — Z8639 Personal history of other endocrine, nutritional and metabolic disease: Secondary | ICD-10-CM | POA: Insufficient documentation

## 2012-11-03 DIAGNOSIS — I10 Essential (primary) hypertension: Secondary | ICD-10-CM | POA: Insufficient documentation

## 2012-11-03 DIAGNOSIS — Z8673 Personal history of transient ischemic attack (TIA), and cerebral infarction without residual deficits: Secondary | ICD-10-CM | POA: Insufficient documentation

## 2012-11-03 DIAGNOSIS — IMO0002 Reserved for concepts with insufficient information to code with codable children: Secondary | ICD-10-CM | POA: Insufficient documentation

## 2012-11-03 DIAGNOSIS — F10929 Alcohol use, unspecified with intoxication, unspecified: Secondary | ICD-10-CM

## 2012-11-03 DIAGNOSIS — Z8659 Personal history of other mental and behavioral disorders: Secondary | ICD-10-CM | POA: Insufficient documentation

## 2012-11-03 DIAGNOSIS — Z862 Personal history of diseases of the blood and blood-forming organs and certain disorders involving the immune mechanism: Secondary | ICD-10-CM | POA: Insufficient documentation

## 2012-11-03 DIAGNOSIS — Y939 Activity, unspecified: Secondary | ICD-10-CM | POA: Insufficient documentation

## 2012-11-03 NOTE — ED Notes (Signed)
Bed: WA09 Expected date:  Expected time:  Means of arrival:  Comments: EMS, fall ETOH

## 2012-11-03 NOTE — ED Notes (Signed)
Per EMS, pt seen at [facility] yesterday for ETOH intoxication and a fall, c/o same tonight.  Pt fell outside of [employer], intoxicated at this time, denies pain or LOC. Pt has old abrasions from previous falls.

## 2012-11-03 NOTE — ED Notes (Signed)
Drinks 2 pints of mouthwash per day.

## 2012-11-03 NOTE — ED Provider Notes (Signed)
CSN: [id number]     Arrival date & time 11/02/12  2138 History     First MD Initiated Contact with Patient 11/03/12 0109     Chief Complaint  Patient presents with  . Alcohol Intoxication  . Fall   (Consider location/radiation/quality/duration/timing/severity/associated sxs/prior Treatment) HPI 62 yo male presents to the ER after a fall.  Pt reports he is a chronic alcoholic, drinks two bottles of mouthwash a day.  He reports a "nice lady" was driving him to the [facility] where he sleeps at night.  He fell getting out of the car.  He is unsure where he hit his head.  He is unsure if he had LOC.  Bystanders who saw him fall called 911.  He denies any pain at present.  He reports past history of stroke.  He is noncompliant with his medications. He is not seeking help for his alcoholism at this time.  He requests a sandwich.  Past Medical History  Diagnosis Date  . Hypercholesteremia   . Stroke   . Hypertension   . Alcohol abuse   . Seizures   . Depression   . Homelessness    History reviewed. No pertinent past surgical history. No family history on file. History  Substance Use Topics  . Smoking status: Current Every Day Smoker -- 0.25 packs/day    Types: Cigarettes  . Smokeless tobacco: Not on file  . Alcohol Use: Yes     Comment: drinks bottles of 2 pint  mouthwash daily    Review of Systems  All other systems reviewed and are negative.    Allergies  Review of patient's allergies indicates no known allergies.  Home Medications  No current outpatient prescriptions on file. BP 132/84  Pulse 80  Temp(Src) 98.6 F (37 C) (Oral)  Resp 19  SpO2 95% Physical Exam  Nursing note and vitals reviewed. Constitutional: He is oriented to person, place, and time. He appears well-developed and well-nourished. No distress.  HENT:  Head: Normocephalic and atraumatic.  Right Ear: External ear normal.  Left Ear: External ear normal.  Nose: Nose normal.  Mouth/Throat: Oropharynx is  clear and moist.  Eyes: Conjunctivae and EOM are normal. Pupils are equal, round, and reactive to light.  Neck: Normal range of motion. Neck supple. No JVD present. No tracheal deviation present. No thyromegaly present.  Cardiovascular: Normal rate, regular rhythm, normal heart sounds and intact distal pulses.  Exam reveals no gallop and no friction rub.   No murmur heard. Pulmonary/Chest: Effort normal and breath sounds normal. No stridor. No respiratory distress. He has no wheezes. He has no rales. He exhibits no tenderness.  Abdominal: Soft. Bowel sounds are normal. He exhibits no distension and no mass. There is no tenderness. There is no rebound and no guarding.  Musculoskeletal: Normal range of motion. He exhibits no edema and no tenderness.  Lymphadenopathy:    He has no cervical adenopathy.  Neurological: He is alert and oriented to person, place, and time. He has normal reflexes. No cranial nerve deficit. He exhibits normal muscle tone. Coordination normal.  Skin: Skin is warm and dry. No rash noted. No erythema. No pallor.  Psychiatric: He has a normal mood and affect. His behavior is normal. Judgment and thought content normal.    ED Course   Procedures (including critical care time)  Labs Reviewed - No data to display Ct Head Wo Contrast  11/03/2012   *RADIOLOGY REPORT*  Clinical Data:  Fall  CT HEAD WITHOUT CONTRAST CT  CERVICAL SPINE WITHOUT CONTRAST  Technique:  Multidetector CT imaging of the head and cervical spine was performed following the standard protocol without intravenous contrast.  Multiplanar CT image reconstructions of the cervical spine were also generated.  Comparison:  Prior MRI and CT from 04/21/2012.  CT HEAD  Findings: Prominence of the CSF containing spaces was consistent with generalized atrophy.  Scattered and confluent hypoattenuation within the periventricular and deep white matter is most consistent with chronic microvascular ischemic disease.  Remote basal  ganglia and pontine lacunar infarcts are present.  No acute intracranial hemorrhage or infarct.  No midline shift or mass lesion.  No extra-axial fluid collection.  Calvarium is intact.  IMPRESSION:  1.  No acute intracranial process.  2.  Atrophy with chronic microvascular white disease. 3.  Remote basal ganglia and pontine lacunar infarcts.  CT CERVICAL SPINE  Findings: There is no acute fracture or listhesis.  Vertebral bodies are normally aligned.  C1-2 articulations are intact.  No prevertebral soft tissue swelling.  Multilevel degenerative disc disease is seen throughout the cervical spine as evidenced by intervertebral disc space narrowing and degenerative endplate osteophytosis.  Central outer emphysematous changes are noted within the partially visualized lung apices.  IMPRESSION: No CT evidence of acute traumatic injury within the cervical spine.   Original Report Authenticated By: Rise Mu, M.D.   Ct Cervical Spine Wo Contrast  11/03/2012   *RADIOLOGY REPORT*  Clinical Data:  Fall  CT HEAD WITHOUT CONTRAST CT CERVICAL SPINE WITHOUT CONTRAST  Technique:  Multidetector CT imaging of the head and cervical spine was performed following the standard protocol without intravenous contrast.  Multiplanar CT image reconstructions of the cervical spine were also generated.  Comparison:  Prior MRI and CT from 04/21/2012.  CT HEAD  Findings: Prominence of the CSF containing spaces was consistent with generalized atrophy.  Scattered and confluent hypoattenuation within the periventricular and deep white matter is most consistent with chronic microvascular ischemic disease.  Remote basal ganglia and pontine lacunar infarcts are present.  No acute intracranial hemorrhage or infarct.  No midline shift or mass lesion.  No extra-axial fluid collection.  Calvarium is intact.  IMPRESSION:  1.  No acute intracranial process.  2.  Atrophy with chronic microvascular white disease. 3.  Remote basal ganglia and  pontine lacunar infarcts.  CT CERVICAL SPINE  Findings: There is no acute fracture or listhesis.  Vertebral bodies are normally aligned.  C1-2 articulations are intact.  No prevertebral soft tissue swelling.  Multilevel degenerative disc disease is seen throughout the cervical spine as evidenced by intervertebral disc space narrowing and degenerative endplate osteophytosis.  Central outer emphysematous changes are noted within the partially visualized lung apices.  IMPRESSION: No CT evidence of acute traumatic injury within the cervical spine.   Original Report Authenticated By: Rise Mu, M.D.   1. ETOH abuse   2. Fall, initial encounter     MDM  62 yo male s/p fall with reported head trauma.  Pt is intoxicated, but pleasant, reports he does not remember all the events of the fall.  Given high risk for SDH, will get head/cspine ct scan.    Olivia Mackie, MD 11/03/12 [phone]

## 2012-11-04 ENCOUNTER — Emergency Department (HOSPITAL_COMMUNITY): Payer: Self-pay

## 2012-11-04 LAB — CBC WITH DIFFERENTIAL/PLATELET
Basophils Absolute: 0 10*3/uL (ref 0.0–0.1)
Basophils Relative: 0 % (ref 0–1)
Eosinophils Relative: 4 % (ref 0–5)
HCT: 37.7 % — ABNORMAL LOW (ref 39.0–52.0)
Lymphs Abs: 1 10*3/uL (ref 0.7–4.0)
MCV: 106.2 fL — ABNORMAL HIGH (ref 78.0–100.0)
Monocytes Relative: 11 % (ref 3–12)
Neutro Abs: 1.2 10*3/uL — ABNORMAL LOW (ref 1.7–7.7)
RDW: 14.4 % (ref 11.5–15.5)
WBC: 2.6 10*3/uL — ABNORMAL LOW (ref 4.0–10.5)

## 2012-11-04 LAB — RAPID URINE DRUG SCREEN, HOSP PERFORMED: Barbiturates: NOT DETECTED

## 2012-11-04 LAB — COMPREHENSIVE METABOLIC PANEL
BUN: 10 mg/dL (ref 6–23)
CO2: 27 mEq/L (ref 19–32)
Chloride: 103 mEq/L (ref 96–112)
Creatinine, Ser: 0.65 mg/dL (ref 0.50–1.35)
GFR calc Af Amer: >90 mL/min (ref >90)
GFR calc non Af Amer: >90 mL/min (ref >90)
Glucose, Bld: 98 mg/dL (ref 70–99)
Total Bilirubin: 0.2 mg/dL — ABNORMAL LOW (ref 0.3–1.2)

## 2012-11-04 LAB — ETHANOL: Alcohol, Ethyl (B): 383 mg/dL — ABNORMAL HIGH (ref 0–11)

## 2012-11-04 LAB — SALICYLATE LEVEL: Salicylate Lvl: <2 mg/dL — ABNORMAL LOW (ref 2.8–20.0)

## 2012-11-04 LAB — ACETAMINOPHEN LEVEL: Acetaminophen (Tylenol), Serum: <15 ug/mL (ref 10–30)

## 2012-11-04 LAB — TROPONIN I: Troponin I: <0.3 ng/mL (ref <0.30)

## 2012-11-04 NOTE — ED Provider Notes (Signed)
Patient clinically sober for discharge at this time  Toy Baker, MD 11/04/12 1329

## 2012-11-04 NOTE — ED Notes (Signed)
Per Social work hold before discharging pt.

## 2012-11-04 NOTE — ED Notes (Signed)
Pt states that he is uncomfortable with discharge states that he is "unsteady on feet". MD notified

## 2012-11-04 NOTE — ED Notes (Signed)
Dr Freida Busman went in to talk with pt, confirmed ok to discharge. Social worker gave pt resources.

## 2012-11-04 NOTE — ED Provider Notes (Addendum)
CSN: [id number]     Arrival date & time 11/03/12  2354 History     First MD Initiated Contact with Patient 11/04/12 0000     Chief Complaint  Patient presents with  . Alcohol Intoxication  . Fall   (Consider location/radiation/quality/duration/timing/severity/associated sxs/prior Treatment) HPI Comments: Patient to the ER by EMS for evaluation. Patient reportedly fell in the parking lot at [employer]. Patient admits to drinking approximately 1 pint of mouthwash today. She reports that he has been doing this for years. He says he drinks mouthwash because "the police can't bother you if you drink mouthwash". Patient had a witnessed fall, bystanders called EMS. It is unclear if he had loss of consciousness. Denies chest pain, shortness of breath, abdominal pain. There is no neck or back pain.  Patient is a 62 y.o. male presenting with intoxication and fall.  Alcohol Intoxication Pertinent negatives include no chest pain, no headaches and no shortness of breath.  Fall Pertinent negatives include no chest pain, no headaches and no shortness of breath.    Past Medical History  Diagnosis Date  . Hypercholesteremia   . Stroke   . Hypertension   . Alcohol abuse   . Seizures   . Depression   . Homelessness    History reviewed. No pertinent past surgical history. History reviewed. No pertinent family history. History  Substance Use Topics  . Smoking status: Current Every Day Smoker -- 0.25 packs/day    Types: Cigarettes  . Smokeless tobacco: Not on file  . Alcohol Use: Yes     Comment: drinks bottles of 2 pint  mouthwash daily    Review of Systems  HENT: Negative for neck pain.   Respiratory: Negative for shortness of breath.   Cardiovascular: Negative for chest pain.  Gastrointestinal: Negative.   Musculoskeletal: Negative for back pain.  Neurological: Negative for headaches.  All other systems reviewed and are negative.    Allergies  Review of patient's allergies indicates  no known allergies.  Home Medications  No current outpatient prescriptions on file. BP 137/98  Pulse 99  Temp(Src) 98.1 F (36.7 C) (Oral)  Resp 18  SpO2 95% Physical Exam  Constitutional: He is oriented to person, place, and time. He appears well-developed and well-nourished. No distress.  HENT:  Head: Normocephalic and atraumatic.  Right Ear: Hearing normal.  Left Ear: Hearing normal.  Nose: Nose normal.  Mouth/Throat: Oropharynx is clear and moist and mucous membranes are normal.  Eyes: Conjunctivae and EOM are normal. Pupils are equal, round, and reactive to light.  Neck: Normal range of motion. Neck supple.  Cardiovascular: Regular rhythm, S1 normal and S2 normal.  Exam reveals no gallop and no friction rub.   No murmur heard. Pulmonary/Chest: Effort normal and breath sounds normal. No respiratory distress. He exhibits no tenderness.  Abdominal: Soft. Normal appearance and bowel sounds are normal. There is no hepatosplenomegaly. There is no tenderness. There is no rebound, no guarding, no tenderness at McBurney's point and negative Murphy's sign. No hernia.  Musculoskeletal: Normal range of motion.  Neurological: He is alert and oriented to person, place, and time. He has normal strength. No cranial nerve deficit or sensory deficit. Coordination normal. GCS eye subscore is 4. GCS verbal subscore is 5. GCS motor subscore is 6.  Skin: Skin is warm and dry. Abrasion (Multiple superficial abrasions from previous falls) noted. No rash noted. No cyanosis.  Psychiatric: He has a normal mood and affect. His speech is normal and behavior is normal. Thought  content normal.    ED Course   Procedures (including critical care time)  EKG:  Date: 11/04/2012  Rate: 81   Rhythm: normal sinus rhythm  QRS Axis: normal  Intervals: normal  ST/T Wave abnormalities: normal  Conduction Disutrbances:none  Narrative Interpretation:   Old EKG Reviewed: unchanged    Labs Reviewed  CBC WITH  DIFFERENTIAL  COMPREHENSIVE METABOLIC PANEL  TROPONIN I  ACETAMINOPHEN LEVEL  URINE RAPID DRUG SCREEN (HOSP PERFORMED)  ETHANOL  SALICYLATE LEVEL   Ct Head Wo Contrast  11/03/2012   *RADIOLOGY REPORT*  Clinical Data:  Fall  CT HEAD WITHOUT CONTRAST CT CERVICAL SPINE WITHOUT CONTRAST  Technique:  Multidetector CT imaging of the head and cervical spine was performed following the standard protocol without intravenous contrast.  Multiplanar CT image reconstructions of the cervical spine were also generated.  Comparison:  Prior MRI and CT from 04/21/2012.  CT HEAD  Findings: Prominence of the CSF containing spaces was consistent with generalized atrophy.  Scattered and confluent hypoattenuation within the periventricular and deep white matter is most consistent with chronic microvascular ischemic disease.  Remote basal ganglia and pontine lacunar infarcts are present.  No acute intracranial hemorrhage or infarct.  No midline shift or mass lesion.  No extra-axial fluid collection.  Calvarium is intact.  IMPRESSION:  1.  No acute intracranial process.  2.  Atrophy with chronic microvascular white disease. 3.  Remote basal ganglia and pontine lacunar infarcts.  CT CERVICAL SPINE  Findings: There is no acute fracture or listhesis.  Vertebral bodies are normally aligned.  C1-2 articulations are intact.  No prevertebral soft tissue swelling.  Multilevel degenerative disc disease is seen throughout the cervical spine as evidenced by intervertebral disc space narrowing and degenerative endplate osteophytosis.  Central outer emphysematous changes are noted within the partially visualized lung apices.  IMPRESSION: No CT evidence of acute traumatic injury within the cervical spine.   Original Report Authenticated By: Rise Mu, M.D.   Ct Cervical Spine Wo Contrast  11/03/2012   *RADIOLOGY REPORT*  Clinical Data:  Fall  CT HEAD WITHOUT CONTRAST CT CERVICAL SPINE WITHOUT CONTRAST  Technique:  Multidetector CT  imaging of the head and cervical spine was performed following the standard protocol without intravenous contrast.  Multiplanar CT image reconstructions of the cervical spine were also generated.  Comparison:  Prior MRI and CT from 04/21/2012.  CT HEAD  Findings: Prominence of the CSF containing spaces was consistent with generalized atrophy.  Scattered and confluent hypoattenuation within the periventricular and deep white matter is most consistent with chronic microvascular ischemic disease.  Remote basal ganglia and pontine lacunar infarcts are present.  No acute intracranial hemorrhage or infarct.  No midline shift or mass lesion.  No extra-axial fluid collection.  Calvarium is intact.  IMPRESSION:  1.  No acute intracranial process.  2.  Atrophy with chronic microvascular white disease. 3.  Remote basal ganglia and pontine lacunar infarcts.  CT CERVICAL SPINE  Findings: There is no acute fracture or listhesis.  Vertebral bodies are normally aligned.  C1-2 articulations are intact.  No prevertebral soft tissue swelling.  Multilevel degenerative disc disease is seen throughout the cervical spine as evidenced by intervertebral disc space narrowing and degenerative endplate osteophytosis.  Central outer emphysematous changes are noted within the partially visualized lung apices.  IMPRESSION: No CT evidence of acute traumatic injury within the cervical spine.   Original Report Authenticated By: Rise Mu, M.D.   Diagnosis: Acute alcohol intoxication  MDM  She  presents to the ER for evaluation of alcohol intoxication. Patient reportedly had a fall was witnessed in the [employer] parking lot. This was secondary to the extent of his intoxication.  At arrival, he did not have any complaints, but because he was acutely intoxicated cannot rule out head injury. Unfortunately, although patient had CT scan yesterday after a fall, required repeat CT today to rule out injury. CT scan was unremarkable. Patient's  blood alcohol level was 383. He will be allowed to sleep here until more so therefore discharged in the morning.  Poison control did not suggest any specific treatment other than that for intoxication.  Gilda Crease, MD 11/04/12 0345  Gilda Crease, MD 11/04/12 [phone]

## 2012-11-10 ENCOUNTER — Encounter (HOSPITAL_COMMUNITY): Payer: Self-pay

## 2012-11-10 ENCOUNTER — Emergency Department (HOSPITAL_COMMUNITY)
Admission: EM | Admit: 2012-11-10 | Discharge: 2012-11-13 | Disposition: A | Discharge status: Other Acute Inpatient Hospital | Payer: Self-pay | Attending: Emergency Medicine | Admitting: Emergency Medicine

## 2012-11-10 DIAGNOSIS — Y9389 Activity, other specified: Secondary | ICD-10-CM | POA: Insufficient documentation

## 2012-11-10 DIAGNOSIS — F17200 Nicotine dependence, unspecified, uncomplicated: Secondary | ICD-10-CM | POA: Insufficient documentation

## 2012-11-10 DIAGNOSIS — Z8639 Personal history of other endocrine, nutritional and metabolic disease: Secondary | ICD-10-CM | POA: Insufficient documentation

## 2012-11-10 DIAGNOSIS — IMO0002 Reserved for concepts with insufficient information to code with codable children: Secondary | ICD-10-CM | POA: Insufficient documentation

## 2012-11-10 DIAGNOSIS — Y9289 Other specified places as the place of occurrence of the external cause: Secondary | ICD-10-CM | POA: Insufficient documentation

## 2012-11-10 DIAGNOSIS — Z5900 Homelessness unspecified: Secondary | ICD-10-CM | POA: Insufficient documentation

## 2012-11-10 DIAGNOSIS — F10239 Alcohol dependence with withdrawal, unspecified: Secondary | ICD-10-CM

## 2012-11-10 DIAGNOSIS — Z8669 Personal history of other diseases of the nervous system and sense organs: Secondary | ICD-10-CM | POA: Insufficient documentation

## 2012-11-10 DIAGNOSIS — F10939 Alcohol use, unspecified with withdrawal, unspecified: Secondary | ICD-10-CM | POA: Diagnosis present

## 2012-11-10 DIAGNOSIS — W19XXXA Unspecified fall, initial encounter: Secondary | ICD-10-CM

## 2012-11-10 DIAGNOSIS — Z8673 Personal history of transient ischemic attack (TIA), and cerebral infarction without residual deficits: Secondary | ICD-10-CM | POA: Insufficient documentation

## 2012-11-10 DIAGNOSIS — F1010 Alcohol abuse, uncomplicated: Secondary | ICD-10-CM | POA: Insufficient documentation

## 2012-11-10 DIAGNOSIS — R296 Repeated falls: Secondary | ICD-10-CM | POA: Insufficient documentation

## 2012-11-10 DIAGNOSIS — F329 Major depressive disorder, single episode, unspecified: Secondary | ICD-10-CM | POA: Diagnosis present

## 2012-11-10 DIAGNOSIS — I10 Essential (primary) hypertension: Secondary | ICD-10-CM | POA: Insufficient documentation

## 2012-11-10 DIAGNOSIS — Z8659 Personal history of other mental and behavioral disorders: Secondary | ICD-10-CM | POA: Insufficient documentation

## 2012-11-10 DIAGNOSIS — R197 Diarrhea, unspecified: Secondary | ICD-10-CM | POA: Insufficient documentation

## 2012-11-10 DIAGNOSIS — F32A Depression, unspecified: Secondary | ICD-10-CM | POA: Diagnosis present

## 2012-11-10 DIAGNOSIS — Z862 Personal history of diseases of the blood and blood-forming organs and certain disorders involving the immune mechanism: Secondary | ICD-10-CM | POA: Insufficient documentation

## 2012-11-10 LAB — CBC WITH DIFFERENTIAL/PLATELET
Basophils Absolute: 0 10*3/uL (ref 0.0–0.1)
Eosinophils Absolute: 0.1 10*3/uL (ref 0.0–0.7)
Lymphocytes Relative: 29 % (ref 12–46)
Lymphs Abs: 0.8 10*3/uL (ref 0.7–4.0)
Neutrophils Relative %: 53 % (ref 43–77)
Platelets: 134 10*3/uL — ABNORMAL LOW (ref 150–400)
RBC: 3.44 MIL/uL — ABNORMAL LOW (ref 4.22–5.81)
RDW: 14 % (ref 11.5–15.5)
WBC: 2.7 10*3/uL — ABNORMAL LOW (ref 4.0–10.5)

## 2012-11-10 LAB — COMPREHENSIVE METABOLIC PANEL
ALT: 46 U/L (ref 0–53)
AST: 76 U/L — ABNORMAL HIGH (ref 0–37)
Albumin: 3.6 g/dL (ref 3.5–5.2)
Alkaline Phosphatase: 80 U/L (ref 39–117)
Potassium: 3.3 mEq/L — ABNORMAL LOW (ref 3.5–5.1)
Sodium: 141 mEq/L (ref 135–145)
Total Protein: 7.1 g/dL (ref 6.0–8.3)

## 2012-11-10 LAB — URINALYSIS, ROUTINE W REFLEX MICROSCOPIC
Bilirubin Urine: NEGATIVE
Hgb urine dipstick: NEGATIVE
Nitrite: NEGATIVE
Specific Gravity, Urine: 1.02 (ref 1.005–1.030)
pH: 5.5 (ref 5.0–8.0)

## 2012-11-10 MED ORDER — ONDANSETRON HCL 4 MG PO TABS: 4.0000 mg | ORAL_TABLET | Freq: Three times a day (TID) | ORAL | Status: DC | PRN

## 2012-11-10 MED ORDER — POTASSIUM CHLORIDE CRYS ER 20 MEQ PO TBCR
40.0000 meq | EXTENDED_RELEASE_TABLET | Freq: Once | ORAL | Status: AC
  Administered 2012-11-10: 40 meq via ORAL
  Filled 2012-11-10: qty 2

## 2012-11-10 MED ORDER — LORAZEPAM 1 MG PO TABS
1.0000 mg | ORAL_TABLET | Freq: Four times a day (QID) | ORAL | Status: DC | PRN
  Filled 2012-11-10: qty 1

## 2012-11-10 MED ORDER — NICOTINE 21 MG/24HR TD PT24: 21.0000 mg | MEDICATED_PATCH | Freq: Every day | TRANSDERMAL | Status: DC

## 2012-11-10 MED ORDER — THIAMINE HCL 100 MG/ML IJ SOLN: 100.0000 mg | Freq: Every day | INTRAMUSCULAR | Status: DC

## 2012-11-10 MED ORDER — FOLIC ACID 1 MG PO TABS
1.0000 mg | ORAL_TABLET | Freq: Every day | ORAL | Status: DC
  Administered 2012-11-10 – 2012-11-12 (×3): 1 mg via ORAL
  Filled 2012-11-10 (×3): qty 1

## 2012-11-10 MED ORDER — LORAZEPAM 2 MG/ML IJ SOLN: 1.0000 mg | Freq: Four times a day (QID) | INTRAMUSCULAR | Status: DC | PRN

## 2012-11-10 MED ORDER — LOPERAMIDE HCL 2 MG PO CAPS
4.0000 mg | ORAL_CAPSULE | Freq: Once | ORAL | Status: AC
  Administered 2012-11-10: 4 mg via ORAL
  Filled 2012-11-10: qty 2

## 2012-11-10 MED ORDER — SODIUM CHLORIDE 0.9 % IV SOLN
1000.0000 mL | Freq: Once | INTRAVENOUS | Status: AC
  Administered 2012-11-10: 1000 mL via INTRAVENOUS

## 2012-11-10 MED ORDER — ZOLPIDEM TARTRATE 5 MG PO TABS: 5.0000 mg | ORAL_TABLET | Freq: Every evening | ORAL | Status: DC | PRN

## 2012-11-10 MED ORDER — VITAMIN B-1 100 MG PO TABS
100.0000 mg | ORAL_TABLET | Freq: Every day | ORAL | Status: DC
  Administered 2012-11-10 – 2012-11-12 (×3): 100 mg via ORAL
  Filled 2012-11-10 (×3): qty 1

## 2012-11-10 MED ORDER — ADULT MULTIVITAMIN W/MINERALS CH
1.0000 | ORAL_TABLET | Freq: Every day | ORAL | Status: DC
  Administered 2012-11-10 – 2012-11-12 (×3): 1 via ORAL
  Filled 2012-11-10 (×3): qty 1

## 2012-11-10 MED ORDER — LORAZEPAM 1 MG PO TABS: 0.0000 mg | ORAL_TABLET | Freq: Two times a day (BID) | ORAL | Status: DC

## 2012-11-10 MED ORDER — ACETAMINOPHEN 325 MG PO TABS: 650.0000 mg | ORAL_TABLET | ORAL | Status: DC | PRN

## 2012-11-10 MED ORDER — SODIUM CHLORIDE 0.9 % IV SOLN
1000.0000 mL | INTRAVENOUS | Status: DC
  Administered 2012-11-10: 1000 mL via INTRAVENOUS

## 2012-11-10 MED ORDER — IBUPROFEN 200 MG PO TABS: 600.0000 mg | ORAL_TABLET | Freq: Three times a day (TID) | ORAL | Status: DC | PRN

## 2012-11-10 MED ORDER — ALUM & MAG HYDROXIDE-SIMETH 200-200-20 MG/5ML PO SUSP: 30.0000 mL | ORAL | Status: DC | PRN

## 2012-11-10 MED ORDER — LORAZEPAM 1 MG PO TABS
0.0000 mg | ORAL_TABLET | Freq: Four times a day (QID) | ORAL | Status: DC
  Administered 2012-11-10 – 2012-11-11 (×3): 1 mg via ORAL
  Filled 2012-11-10 (×2): qty 1

## 2012-11-10 NOTE — ED Notes (Signed)
2 IV attempts.  RN notified.

## 2012-11-10 NOTE — Progress Notes (Signed)
[facility] ED Cm noted CM consult for homeless pt without pcp therefore at this time not eligible for home health services not [facility] program (pt was assisted with medications for RTS in May 2014 by [facility] CM- refer to note) Noted SW consult ordered

## 2012-11-10 NOTE — BH Assessment (Signed)
Assessment Note  [name] [name] is an 62 y.o. male who presents to the ED after a fall due to alcohol intoxication. CSW met with pt to assess patient needs and complete bhh assessment. Pt reports that he is homeless and wants to complete detox in order to turn his life around. Pt reports, "Today is my 62nd birthday and I can't keep living like this or I'm going to end up dying for drinking." Patient reports that he wants to complete detox, and complete a treatment program. Pt reports that he is waiting to start receiving his ss check on October 27th. Per patient, his friends at a [employer], [name] and [name] assisted with helping set up patient a checking account at [facility] in [place].   Pt denies Si/HI/Ah/VH. Pt does report depressive symptoms including, hopelessness, feelings of worthlessness, guilt, and tearfulness. Pt reprots drinking 2-3 pints of mouth wash in order to get drunk daily. Pt BAL was 282 upon admission to the ED on 825/2014   Axis I: Alcohol abuse  Axis II: Deferred Axis III:  Past Medical History  Diagnosis Date  . Hypercholesteremia   . Stroke   . Hypertension   . Alcohol abuse   . Seizures   . Depression   . Homelessness    Axis IV: economic problems, housing problems, occupational problems, other psychosocial or environmental problems, problems related to social environment, problems with access to health care services and problems with primary support group Axis V: 41-50 serious symptoms  Past Medical History:  Past Medical History  Diagnosis Date  . Hypercholesteremia   . Stroke   . Hypertension   . Alcohol abuse   . Seizures   . Depression   . Homelessness     History reviewed. No pertinent past surgical history.  Family History: No family history on file.  Social History:  reports that he has been smoking Cigarettes.  He has been smoking about 0.25 packs per day. He does not have any smokeless tobacco history on file. He reports that   drinks alcohol. He reports that he does not use illicit drugs.  Additional Social History:  Alcohol / Drug Use History of alcohol / drug use?: Yes Substance #1 Name of Substance 1: alcohol  1 - Age of First Use: teens 1 - Amount (size/oz): 2-3 pints of mouth wash 1 - Frequency: daily 1 - Duration: years 1 - Last Use / Amount: yesterday bal 282  CIWA: CIWA-Ar BP: 151/91 mmHg Pulse Rate: 93 COWS:    Allergies: No Known Allergies  Home Medications:  (Not in a hospital admission)  OB/GYN Status:  No LMP for male patient.  General Assessment Data Is this a Tele or Face-to-Face Assessment?: Face-to-Face Is this an Initial Assessment or a Re-assessment for this encounter?: Initial Assessment Living Arrangements: Alone Can pt return to current living arrangement?: Yes Admission Status: Voluntary Is patient capable of signing voluntary admission?: Yes Transfer from: Home Referral Source: Self/Family/Friend     [facility] Crisis Care Plan Living Arrangements: Alone  Education Status Is patient currently in school?: No  Risk to self Suicidal Ideation: No Suicidal Intent: No Is patient at risk for suicide?: No Suicidal Plan?: No Access to Means: No What has been your use of drugs/alcohol within the last 12 months?: no Previous Attempts/Gestures: No How many times?: 0 Other Self Harm Risks: no Triggers for Past Attempts: None known Intentional Self Injurious Behavior: None Family Suicide History: No Recent stressful life event(s): Conflict (Comment);Loss (Comment) (alcohol abuse,  no housing, no support ) Persecutory voices/beliefs?: No Depression: Yes Depression Symptoms: Despondent;Tearfulness;Guilt;Loss of interest in usual pleasures;Feeling worthless/self pity Substance abuse history and/or treatment for substance abuse?: Yes  Risk to Others Homicidal Ideation: No Thoughts of Harm to Others: No Current Homicidal Intent: No Current Homicidal Plan: No Access to  Homicidal Means: No Identified Victim: n/a History of harm to others?: No Assessment of Violence: None Noted Violent Behavior Description: none Does patient have access to weapons?: No Criminal Charges Pending?: No Does patient have a court date: No  Psychosis Hallucinations: None noted Delusions: None noted  Mental Status Report Appear/Hygiene: Disheveled Eye Contact: Fair Motor Activity: Freedom of movement Speech: Logical/coherent Level of Consciousness: Alert Mood: Depressed Affect: Appropriate to circumstance Anxiety Level: Minimal Thought Processes: Coherent;Relevant Judgement: Unimpaired Orientation: Person;Place;Time;Situation Obsessive Compulsive Thoughts/Behaviors: None  Cognitive Functioning Concentration: Normal Memory: Recent Intact;Remote Intact IQ: Average Insight: Fair Impulse Control: Fair Appetite: Good Sleep: No Change Total Hours of Sleep: 6 Vegetative Symptoms: None  ADLScreening [facility] Assessment Services) Patient's cognitive ability adequate to safely complete daily activities?: Yes Patient able to express need for assistance with ADLs?: Yes Independently performs ADLs?: Yes (appropriate for developmental age)  Prior Inpatient Therapy Prior Inpatient Therapy: Yes Prior Therapy Dates: 2014 Prior Therapy Facilty/Provider(s): [facility] Reason for Treatment: alcohol abuse  Prior Outpatient Therapy Prior Outpatient Therapy: No  ADL Screening (condition at time of admission) Patient's cognitive ability adequate to safely complete daily activities?: Yes Patient able to express need for assistance with ADLs?: Yes Independently performs ADLs?: Yes (appropriate for developmental age) Walks in Home: Independent with device (comment) (walker)  Home Assistive Devices/Equipment Home Assistive Devices/Equipment: Wheelchair      Values / Beliefs Cultural Requests During Hospitalization: None Spiritual Requests During Hospitalization: None         Additional Information 1:1 In Past 12 Months?: No CIRT Risk: No Elopement Risk: No Does patient have medical clearance?: Yes     Disposition:  Disposition Initial Assessment Completed for this Encounter: Yes Disposition of Patient: Inpatient treatment program Type of inpatient treatment program: Adult  On Site Evaluation by:   Reviewed with Physician:    Catha Gosselin A 11/10/2012 1:16 PM

## 2012-11-10 NOTE — Progress Notes (Signed)
CSW received referral from MD regarding resources, csw will follow up and assess patient needs.   Catha Gosselin, [place] [phone]  ED CSW 11/10/2012 1002am

## 2012-11-10 NOTE — ED Provider Notes (Signed)
CSN: [id number]     Arrival date & time 11/10/12  4098 History     First MD Initiated Contact with Patient 11/10/12 0745     Chief Complaint  Patient presents with  . Fall  . Medical Clearance   (Consider location/radiation/quality/duration/timing/severity/associated sxs/prior Treatment) HPI Patient emergency Department stating last night states he was crossing the street with his walker and he lost his balance and he does a lot and he fell in the street. He relates bystanders helped him up. He had an abrasion on his left forearm from a prior injury that got reopened.Marland Kitchen He denies having any injury from the fall. However last night he started having diarrhea and he's had about 3 episodes. Patient presents with incontinence of stool. He denies nausea, vomiting, or abdominal pain. Patient states he is homeless for the past 8 years. He states he hangs out at [facility] to stay cool during the day. He states he was living in [place] and was staying under a bridge. He was staying at the [facility] but he was asked to leave because he and a couple of other guys were "collecting too much junk". He states he has been unemployed since 2006 when he used to work for [employer]. He states he has 2 sisters however he cannot live with him. He states he used to live with his mother however she is deceased. He states he only gets food stamps.   PCP None  Past Medical History  Diagnosis Date  . Hypercholesteremia   . Stroke   . Hypertension   . Alcohol abuse   . Seizures   . Depression   . Homelessness    History reviewed. No pertinent past surgical history. No family history on file. History  Substance Use Topics  . Smoking status: Current Every Day Smoker -- 0.25 packs/day    Types: Cigarettes  . Smokeless tobacco: Not on file  . Alcohol Use: Yes     Comment: drinks bottles of 2 pint  mouthwash daily  homeless uses a walker  Review of Systems  All other systems reviewed and are  negative.    Allergies  Review of patient's allergies indicates no known allergies.  Home Medications  No current outpatient prescriptions on file. No meds for at  Least 3 months  BP 141/89  Pulse 78  Temp(Src) 98.2 F (36.8 C) (Oral)  Resp 20  SpO2 98%  Vital signs normal   Physical Exam  Nursing note and vitals reviewed. Constitutional: He is oriented to person, place, and time. He appears well-developed and well-nourished.  Non-toxic appearance. He does not appear ill. No distress.  HENT:  Head: Normocephalic and atraumatic.  Right Ear: External ear normal.  Left Ear: External ear normal.  Nose: Nose normal. No mucosal edema or rhinorrhea.  Mouth/Throat: Oropharynx is clear and moist and mucous membranes are normal. No dental abscesses or edematous.  Eyes: Conjunctivae and EOM are normal. Pupils are equal, round, and reactive to light.  Neck: Normal range of motion and full passive range of motion without pain. Neck supple.  Cardiovascular: Normal rate, regular rhythm and normal heart sounds.  Exam reveals no gallop and no friction rub.   No murmur heard. Pulmonary/Chest: Effort normal and breath sounds normal. No respiratory distress. He has no wheezes. He has no rhonchi. He has no rales. He exhibits no tenderness and no crepitus.  Abdominal: Soft. Normal appearance and bowel sounds are normal. He exhibits no distension. There is no tenderness. There is no  rebound and no guarding.  Musculoskeletal: Normal range of motion. He exhibits no edema and no tenderness.  Moves all extremities well. Has an abrasion on his left proximal forearm that is from a prior fall  Neurological: He is alert and oriented to person, place, and time. He has normal strength. No cranial nerve deficit.  Skin: Skin is warm, dry and intact. No rash noted. No erythema. No pallor.  Has stool on his legs  Psychiatric: He has a normal mood and affect. His speech is normal and behavior is normal. His mood  appears not anxious.    ED Course   Medications  0.9 %  sodium chloride infusion (0 mLs Intravenous Stopped 11/10/12 1004)    Followed by  0.9 %  sodium chloride infusion (1,000 mLs Intravenous New Bag/Given 11/10/12 0855)  acetaminophen (TYLENOL) tablet 650 mg (not administered)  ibuprofen (ADVIL,MOTRIN) tablet 600 mg (not administered)  nicotine (NICODERM CQ - dosed in mg/24 hours) patch 21 mg (21 mg Transdermal Not Given 11/10/12 1436)  ondansetron (ZOFRAN) tablet 4 mg (not administered)  alum & mag hydroxide-simeth (MAALOX/MYLANTA) 200-200-20 MG/5ML suspension 30 mL (not administered)  zolpidem (AMBIEN) tablet 5 mg (not administered)  LORazepam (ATIVAN) tablet 1 mg (not administered)    Or  LORazepam (ATIVAN) injection 1 mg (not administered)  thiamine (VITAMIN B-1) tablet 100 mg (100 mg Oral Given 11/10/12 1442)    Or  thiamine (B-1) injection 100 mg ( Intravenous See Alternative 11/10/12 1442)  folic acid (FOLVITE) tablet 1 mg (1 mg Oral Given 11/10/12 1442)  multivitamin with minerals tablet 1 tablet (1 tablet Oral Given 11/10/12 1442)  LORazepam (ATIVAN) tablet 0-4 mg (0 mg Oral Not Given 11/10/12 1445)    Followed by  LORazepam (ATIVAN) tablet 0-4 mg (not administered)  loperamide (IMODIUM) capsule 4 mg (4 mg Oral Given 11/10/12 0858)  potassium chloride SA (K-DUR,KLOR-CON) CR tablet 40 mEq (40 mEq Oral Given 11/10/12 1231)     Procedures (including critical care time)  08:25 Baxter Hire, social worker will talk to patient.   12:12 Baxter Hire, states patient is expressing interest in going to detox, review of his old chart shows he went to [facility] in May.   Pt states he didn't stay sober the day he was released from [facility]. States he was at ?[facility] last year (possible [facility]).  States he does want detox. He has no SI or HI  1500 Pt hasn't had any more diarrhea once he was clean up on arrival to the ED.   Pt is waiting disposition.   Results for orders placed during the hospital encounter of  11/10/12  CBC WITH DIFFERENTIAL      Result Value Range   WBC 2.7 (*) 4.0 - 10.5 K/uL   RBC 3.44 (*) 4.22 - 5.81 MIL/uL   Hemoglobin 12.4 (*) 13.0 - 17.0 g/dL   HCT 16.1 (*) 09.6 - 04.5 %   MCV 105.8 (*) 78.0 - 100.0 fL   MCH 36.0 (*) 26.0 - 34.0 pg   MCHC 34.1  30.0 - 36.0 g/dL   RDW 40.9  81.1 - 91.4 %   Platelets 134 (*) 150 - 400 K/uL   Neutrophils Relative % 53  43 - 77 %   Neutro Abs 1.4 (*) 1.7 - 7.7 K/uL   Lymphocytes Relative 29  12 - 46 %   Lymphs Abs 0.8  0.7 - 4.0 K/uL   Monocytes Relative 15 (*) 3 - 12 %   Monocytes Absolute 0.4  0.1 -  1.0 K/uL   Eosinophils Relative 3  0 - 5 %   Eosinophils Absolute 0.1  0.0 - 0.7 K/uL   Basophils Relative 0  0 - 1 %   Basophils Absolute 0.0  0.0 - 0.1 K/uL  COMPREHENSIVE METABOLIC PANEL      Result Value Range   Sodium 141  135 - 145 mEq/L   Potassium 3.3 (*) 3.5 - 5.1 mEq/L   Chloride 102  96 - 112 mEq/L   CO2 25  19 - 32 mEq/L   Glucose, Bld 105 (*) 70 - 99 mg/dL   BUN 8  6 - 23 mg/dL   Creatinine, Ser 6.96  0.50 - 1.35 mg/dL   Calcium 8.4  8.4 - 29.5 mg/dL   Total Protein 7.1  6.0 - 8.3 g/dL   Albumin 3.6  3.5 - 5.2 g/dL   AST 76 (*) 0 - 37 U/L   ALT 46  0 - 53 U/L   Alkaline Phosphatase 80  39 - 117 U/L   Total Bilirubin 0.3  0.3 - 1.2 mg/dL   GFR calc non Af Amer >90  >90 mL/min   GFR calc Af Amer >90  >90 mL/min  ETHANOL      Result Value Range   Alcohol, Ethyl (B) 282 (*) 0 - 11 mg/dL  URINALYSIS, ROUTINE W REFLEX MICROSCOPIC      Result Value Range   Color, Urine YELLOW  YELLOW   APPearance CLEAR  CLEAR   Specific Gravity, Urine 1.020  1.005 - 1.030   pH 5.5  5.0 - 8.0   Glucose, UA NEGATIVE  NEGATIVE mg/dL   Hgb urine dipstick NEGATIVE  NEGATIVE   Bilirubin Urine NEGATIVE  NEGATIVE   Ketones, ur NEGATIVE  NEGATIVE mg/dL   Protein, ur NEGATIVE  NEGATIVE mg/dL   Urobilinogen, UA 0.2  0.0 - 1.0 mg/dL   Nitrite NEGATIVE  NEGATIVE   Leukocytes, UA NEGATIVE  NEGATIVE    Laboratory interpretation all normal  except mild anemia which is chronic  Ct Head Wo Contrast  11/04/2012   IMPRESSION: No acute abnormalities.  Atrophy.  Remote basal ganglia and pontine infarcts.   Original Report Authenticated By: Francene Boyers, M.D.   Ct Head Wo Contrast  11/03/2012   * Comparison:  Prior MRI and CT from 04/21/2012.  CT HEAD   IMPRESSION:  1.  No acute intracranial process.  2.  Atrophy with chronic microvascular white disease. 3.  Remote basal ganglia and pontine lacunar infarcts.  CT CERVICAL SPINE    IMPRESSION: No CT evidence of acute traumatic injury within the cervical spine.   Original Report Authenticated By: Rise Mu, M.D.   Ct Cervical Spine Wo Contrast  11/03/2012 IMPRESSION: No CT evidence of acute traumatic injury within the cervical spine.   Original Report Authenticated By: Rise Mu, M.D.     Date: 11/10/2012  Rate: 79  Rhythm: normal sinus rhythm and premature ventricular contractions (PVC)  QRS Axis: left  Intervals: QT prolonged  ST/T Wave abnormalities: normal  Conduction Disutrbances:none  Narrative Interpretation:   Old EKG Reviewed: none available and unchanged from 11/04/2012    1. Alcohol abuse   2. Fall, initial encounter   3. Diarrhea     Disposition pending  Devoria Albe, MD, FACEP   MDM    Ward Givens, MD 11/10/12 [phone]

## 2012-11-10 NOTE — ED Notes (Signed)
Pt is aware that we need urine and given a urinal.  Pt sts he can not provide a sample at this time.

## 2012-11-10 NOTE — ED Notes (Addendum)
Patient in blue scrubs and red socks. Patient wearing glasses.

## 2012-11-10 NOTE — Progress Notes (Addendum)
Pt referred to [facility] pending review. Pt declined from [facility] due to history of seizure disorder.  Pt not appropriate for [facility] due to physical limitations and walking with a walker.   Pt pending review at [facility].   Catha Gosselin, LCSW [phone]  ED CSW .11/10/2012 1326pm

## 2012-11-10 NOTE — ED Notes (Signed)
Per Baxter Hire, with Social Work, Pt is now asking for ETOH detox.  She is currently working on placement.

## 2012-11-10 NOTE — ED Notes (Signed)
Patient and belongings both wanded by security.  

## 2012-11-10 NOTE — ED Notes (Signed)
Bed: WA21 Expected date:  Expected time:  Means of arrival:  Comments: weakness 

## 2012-11-10 NOTE — ED Notes (Addendum)
Per EMS, Pt c/o generalized body aches and a fall, last night.  Pain score 5/10.  Pt sts "I got dizzy and fell."  Admits to ETOH use.  Sts "I drank a pint of mouth wash."  Vitals are stable.  NAD noted.

## 2012-11-10 NOTE — ED Notes (Addendum)
Patient has 3 bags of belongings and a walker. Patient states only valuable is wallet. Wallet included [employer] ID, 4 bus passes, and $8.00 ($5 bill and 3 $1 bills). RN Lenis Dickinson completed inventory form for wallet and given to security. -- Patient belongings and walker at nurses station near room.

## 2012-11-10 NOTE — Progress Notes (Signed)
[facility] CL provided patient with a list of primary care resources in [place] and a [employer] application.  °

## 2012-11-11 ENCOUNTER — Emergency Department (HOSPITAL_COMMUNITY): Payer: Self-pay

## 2012-11-11 ENCOUNTER — Encounter (HOSPITAL_COMMUNITY): Payer: Self-pay | Admitting: Registered Nurse

## 2012-11-11 DIAGNOSIS — F10239 Alcohol dependence with withdrawal, unspecified: Secondary | ICD-10-CM | POA: Diagnosis present

## 2012-11-11 DIAGNOSIS — F1010 Alcohol abuse, uncomplicated: Secondary | ICD-10-CM

## 2012-11-11 DIAGNOSIS — F329 Major depressive disorder, single episode, unspecified: Secondary | ICD-10-CM

## 2012-11-11 MED ORDER — CHLORDIAZEPOXIDE HCL 25 MG PO CAPS
25.0000 mg | ORAL_CAPSULE | Freq: Three times a day (TID) | ORAL | Status: DC
  Administered 2012-11-12: 25 mg via ORAL

## 2012-11-11 MED ORDER — ONDANSETRON 4 MG PO TBDP: 4.0000 mg | ORAL_TABLET | Freq: Four times a day (QID) | ORAL | Status: DC | PRN

## 2012-11-11 MED ORDER — CHLORDIAZEPOXIDE HCL 25 MG PO CAPS
25.0000 mg | ORAL_CAPSULE | Freq: Once | ORAL | Status: AC
  Administered 2012-11-11: 25 mg via ORAL

## 2012-11-11 MED ORDER — CHLORDIAZEPOXIDE HCL 25 MG PO CAPS: 25.0000 mg | ORAL_CAPSULE | Freq: Every day | ORAL | Status: DC

## 2012-11-11 MED ORDER — LOPERAMIDE HCL 2 MG PO CAPS: 2.0000 mg | ORAL_CAPSULE | ORAL | Status: DC | PRN

## 2012-11-11 MED ORDER — CHLORDIAZEPOXIDE HCL 25 MG PO CAPS
25.0000 mg | ORAL_CAPSULE | Freq: Four times a day (QID) | ORAL | Status: DC | PRN
  Filled 2012-11-11 (×5): qty 1

## 2012-11-11 MED ORDER — CHLORDIAZEPOXIDE HCL 25 MG PO CAPS
25.0000 mg | ORAL_CAPSULE | Freq: Four times a day (QID) | ORAL | Status: AC
  Administered 2012-11-11 – 2012-11-12 (×6): 25 mg via ORAL
  Filled 2012-11-11 (×3): qty 1

## 2012-11-11 MED ORDER — CHLORDIAZEPOXIDE HCL 25 MG PO CAPS: 25.0000 mg | ORAL_CAPSULE | ORAL | Status: DC

## 2012-11-11 MED ORDER — HYDROXYZINE HCL 25 MG PO TABS: 25.0000 mg | ORAL_TABLET | Freq: Four times a day (QID) | ORAL | Status: DC | PRN

## 2012-11-11 NOTE — ED Notes (Signed)
Sitter accompany another pt explained that the pt had fallen on the floor. Entering the  Room the pt was found sitting on the floor at the door. Pt stated that he had called out 4 times for someone to assist him with bathroom. Urinal on bedside table and commode at bedside. No injury noted to pt. Pt reports pain to elbow, tailbone and butt area. Pt assisted back to bed with out any complaints of pain on movement. VS stable. CN aware of incident.

## 2012-11-11 NOTE — Consult Note (Signed)
Reason for Consult: Evaluation for inpatient treatment Referring Physician: EDP  [name] [name] is an 62 y.o. male.  HPI:  Patient states that he has a problem with alcohol.  Patient states that his depression started when his mother left to live with his 2 sisters and he lost his job (when he became homeless).  "Drinking helps me to deal with things and helps to sleep at night"  Patient states that depression now is related to the situation that he is in being homeless.  Patient denies suicidal ideation stating "occassionally I'll think about what is the use of living." Denies plan. Patient denies homicidal ideation, psychosis, and paranoia.  Patient also states that he tried detox and rehab before at [facility] but once discharged couldn't find a place to stay so ended up drinking again.  Patient states that he is interested in a Family Care Home or Assisted Living if can find a place located in [place].    Past Medical History  Diagnosis Date  . Hypercholesteremia   . Stroke   . Hypertension   . Alcohol abuse   . Seizures   . Depression   . Homelessness     History reviewed. No pertinent past surgical history.  No family history on file.  Social History:  reports that he has been smoking Cigarettes.  He has been smoking about 0.25 packs per day. He does not have any smokeless tobacco history on file. He reports that  drinks alcohol. He reports that he does not use illicit drugs.  Allergies: No Known Allergies  Medications: I have reviewed the patient's current medications.  Results for orders placed during the hospital encounter of 11/10/12 (from the past 48 hour(s))  CBC WITH DIFFERENTIAL     Status: Abnormal   Collection Time    11/10/12  8:52 AM      Result Value Range   WBC 2.7 (*) 4.0 - 10.5 K/uL   RBC 3.44 (*) 4.22 - 5.81 MIL/uL   Hemoglobin 12.4 (*) 13.0 - 17.0 g/dL   HCT 16.1 (*) 09.6 - 04.5 %   MCV 105.8 (*) 78.0 - 100.0 fL   MCH 36.0 (*) 26.0 - 34.0 pg   MCHC 34.1   30.0 - 36.0 g/dL   RDW 40.9  81.1 - 91.4 %   Platelets 134 (*) 150 - 400 K/uL   Neutrophils Relative % 53  43 - 77 %   Neutro Abs 1.4 (*) 1.7 - 7.7 K/uL   Lymphocytes Relative 29  12 - 46 %   Lymphs Abs 0.8  0.7 - 4.0 K/uL   Monocytes Relative 15 (*) 3 - 12 %   Monocytes Absolute 0.4  0.1 - 1.0 K/uL   Eosinophils Relative 3  0 - 5 %   Eosinophils Absolute 0.1  0.0 - 0.7 K/uL   Basophils Relative 0  0 - 1 %   Basophils Absolute 0.0  0.0 - 0.1 K/uL  COMPREHENSIVE METABOLIC PANEL     Status: Abnormal   Collection Time    11/10/12  8:52 AM      Result Value Range   Sodium 141  135 - 145 mEq/L   Potassium 3.3 (*) 3.5 - 5.1 mEq/L   Chloride 102  96 - 112 mEq/L   CO2 25  19 - 32 mEq/L   Glucose, Bld 105 (*) 70 - 99 mg/dL   BUN 8  6 - 23 mg/dL   Creatinine, Ser 7.82  0.50 - 1.35 mg/dL  Calcium 8.4  8.4 - 10.5 mg/dL   Total Protein 7.1  6.0 - 8.3 g/dL   Albumin 3.6  3.5 - 5.2 g/dL   AST 76 (*) 0 - 37 U/L   ALT 46  0 - 53 U/L   Alkaline Phosphatase 80  39 - 117 U/L   Total Bilirubin 0.3  0.3 - 1.2 mg/dL   GFR calc non Af Amer >90  >90 mL/min   GFR calc Af Amer >90  >90 mL/min   Comment: (NOTE)     The eGFR has been calculated using the CKD EPI equation.     This calculation has not been validated in all clinical situations.     eGFR's persistently <90 mL/min signify possible Chronic Kidney     Disease.  ETHANOL     Status: Abnormal   Collection Time    11/10/12  8:52 AM      Result Value Range   Alcohol, Ethyl (B) 282 (*) 0 - 11 mg/dL   Comment:            LOWEST DETECTABLE LIMIT FOR     SERUM ALCOHOL IS 11 mg/dL     FOR MEDICAL PURPOSES ONLY  URINALYSIS, ROUTINE W REFLEX MICROSCOPIC     Status: None   Collection Time    11/10/12 11:33 AM      Result Value Range   Color, Urine YELLOW  YELLOW   APPearance CLEAR  CLEAR   Specific Gravity, Urine 1.020  1.005 - 1.030   pH 5.5  5.0 - 8.0   Glucose, UA NEGATIVE  NEGATIVE mg/dL   Hgb urine dipstick NEGATIVE  NEGATIVE    Bilirubin Urine NEGATIVE  NEGATIVE   Ketones, ur NEGATIVE  NEGATIVE mg/dL   Protein, ur NEGATIVE  NEGATIVE mg/dL   Urobilinogen, UA 0.2  0.0 - 1.0 mg/dL   Nitrite NEGATIVE  NEGATIVE   Leukocytes, UA NEGATIVE  NEGATIVE   Comment: MICROSCOPIC NOT DONE ON URINES WITH NEGATIVE PROTEIN, BLOOD, LEUKOCYTES, NITRITE, OR GLUCOSE <1000 mg/dL.    No results found.  Review of Systems  Gastrointestinal: Positive for nausea.  Neurological: Positive for tremors and weakness.  Psychiatric/Behavioral: Positive for depression (Related to situation of being homeless and alcohol dependence) and substance abuse (ETOH). Negative for suicidal ideas (Patient states "occasionally I think what is the use to keep on living"  Denies plan), hallucinations and memory loss. The patient is not nervous/anxious and does not have insomnia.    Blood pressure 146/94, pulse 96, temperature 98.1 F (36.7 C), temperature source Oral, resp. rate 17, SpO2 99.00%. Physical Exam  Constitutional: He is oriented to person, place, and time.  Neck: Normal range of motion.  Respiratory: Effort normal.  Neurological: He is alert and oriented to person, place, and time.  Skin: Skin is warm.  Psychiatric: He is not agitated and not actively hallucinating. Thought content is paranoid. Thought content is not delusional. He exhibits a depressed mood. He expresses no homicidal and no suicidal ideation.    Assessment/Plan:  Recommendation/Disposition:  Inpatient treatment.  Patient accepted to [facility] 300 hall. 1. Admit for crisis management and stabilization.  2. Review and initiate  medications pertinent to patient illness and treatment.  3. Medication management to reduce current symptoms to base line and improve the         patient's overall level of functioning.   Start [facility] Protocol SW to assist in finding Family Care Home or Assisted Living after discharge from inpatient treatment.  Rankin, Shuvon, FNP-BC 11/11/2012, 12:10  PM

## 2012-11-11 NOTE — ED Notes (Signed)
Pt lying in bed resting. States that he doesn't want to watch TV. AAOx4.

## 2012-11-12 NOTE — ED Notes (Addendum)
One walker at nurse's station. Three belongings bags in locker # 33.

## 2012-11-12 NOTE — ED Notes (Signed)
Pt was at room door standing trying to close door. Tech stated that it was explained to pt that he is not to get up without calling for assistance. Tech assisted pt back in bed and explained that he was not to get up without assistance. Consideration for safety sitter  Given to CN and CN made aware of previous incident. Upon availability pt will be moved to a room with better view of pt.

## 2012-11-12 NOTE — Progress Notes (Signed)
Patient continues to have depression and needing alcohol detox.  Patient is able to ambulate using walker without any difficult. Will continue to monitor and seek placement for detox and rehab.    Shuvon B. Rankin FNP-BC Family Nurse Practitioner, Board Certified I agreed with the findings, treatment and disposition plan of this patient. Kathryne Sharper, MD

## 2012-11-12 NOTE — ED Provider Notes (Signed)
Told by nurse that patient fell getting up to the bedside toilet.  Unwitnessed.  Upon my evaluation of the patient he endorses a mechanical fall without syncope.  Reports bilateral elbow and pelvis pain without any outward evidence of trauma.  Plain films are neg.    Shon Baton, MD 11/12/12 [phone]

## 2012-11-12 NOTE — ED Notes (Signed)
Upon looking in the room pt was standing up unassisted. Explained to pt that he was to not get up without using the call bell for assistance. Pt stated "I know, I know". Explained the pt safety about fall, reviewed previous incident with him and put pt back in bed with bedside rails up. Pt was shown how to use the call bell and call bell in site.

## 2012-11-13 ENCOUNTER — Encounter (HOSPITAL_COMMUNITY): Payer: Self-pay | Admitting: *Deleted

## 2012-11-13 ENCOUNTER — Inpatient Hospital Stay (HOSPITAL_COMMUNITY)
Admission: AD | Admit: 2012-11-13 | Discharge: 2012-11-20 | DRG: 897 | Disposition: A | Discharge status: Home / Self Care | Payer: Self-pay | Source: Intra-hospital | Attending: Psychiatry | Admitting: Psychiatry

## 2012-11-13 DIAGNOSIS — Z5900 Homelessness unspecified: Secondary | ICD-10-CM

## 2012-11-13 DIAGNOSIS — F411 Generalized anxiety disorder: Secondary | ICD-10-CM | POA: Diagnosis present

## 2012-11-13 DIAGNOSIS — R531 Weakness: Secondary | ICD-10-CM

## 2012-11-13 DIAGNOSIS — Z8673 Personal history of transient ischemic attack (TIA), and cerebral infarction without residual deficits: Secondary | ICD-10-CM

## 2012-11-13 DIAGNOSIS — F10939 Alcohol use, unspecified with withdrawal, unspecified: Principal | ICD-10-CM | POA: Diagnosis present

## 2012-11-13 DIAGNOSIS — F329 Major depressive disorder, single episode, unspecified: Secondary | ICD-10-CM | POA: Diagnosis present

## 2012-11-13 DIAGNOSIS — F1994 Other psychoactive substance use, unspecified with psychoactive substance-induced mood disorder: Secondary | ICD-10-CM | POA: Diagnosis present

## 2012-11-13 DIAGNOSIS — F321 Major depressive disorder, single episode, moderate: Secondary | ICD-10-CM | POA: Diagnosis present

## 2012-11-13 DIAGNOSIS — G4700 Insomnia, unspecified: Secondary | ICD-10-CM | POA: Diagnosis present

## 2012-11-13 DIAGNOSIS — F1020 Alcohol dependence, uncomplicated: Secondary | ICD-10-CM | POA: Diagnosis present

## 2012-11-13 DIAGNOSIS — E7800 Pure hypercholesterolemia, unspecified: Secondary | ICD-10-CM | POA: Diagnosis present

## 2012-11-13 DIAGNOSIS — I10 Essential (primary) hypertension: Secondary | ICD-10-CM | POA: Diagnosis present

## 2012-11-13 DIAGNOSIS — R569 Unspecified convulsions: Secondary | ICD-10-CM | POA: Diagnosis present

## 2012-11-13 DIAGNOSIS — F1010 Alcohol abuse, uncomplicated: Secondary | ICD-10-CM

## 2012-11-13 DIAGNOSIS — F10239 Alcohol dependence with withdrawal, unspecified: Principal | ICD-10-CM

## 2012-11-13 DIAGNOSIS — F17200 Nicotine dependence, unspecified, uncomplicated: Secondary | ICD-10-CM | POA: Diagnosis present

## 2012-11-13 MED ORDER — HYDROXYZINE HCL 25 MG PO TABS: 25.0000 mg | ORAL_TABLET | Freq: Four times a day (QID) | ORAL | Status: AC | PRN

## 2012-11-13 MED ORDER — ALUM & MAG HYDROXIDE-SIMETH 200-200-20 MG/5ML PO SUSP: 30.0000 mL | ORAL | Status: DC | PRN

## 2012-11-13 MED ORDER — CHLORDIAZEPOXIDE HCL 25 MG PO CAPS
25.0000 mg | ORAL_CAPSULE | Freq: Four times a day (QID) | ORAL | Status: DC
  Administered 2012-11-13 (×2): 25 mg via ORAL
  Filled 2012-11-13 (×2): qty 1

## 2012-11-13 MED ORDER — LISINOPRIL 10 MG PO TABS
10.0000 mg | ORAL_TABLET | Freq: Every day | ORAL | Status: DC
  Administered 2012-11-13 – 2012-11-20 (×8): 10 mg via ORAL
  Filled 2012-11-13: qty 1
  Filled 2012-11-13: qty 14
  Filled 2012-11-13 (×2): qty 1
  Filled 2012-11-13: qty 14
  Filled 2012-11-13 (×6): qty 1

## 2012-11-13 MED ORDER — VITAMIN B-1 100 MG PO TABS
100.0000 mg | ORAL_TABLET | Freq: Every day | ORAL | Status: DC
  Administered 2012-11-14 – 2012-11-20 (×7): 100 mg via ORAL
  Filled 2012-11-13 (×10): qty 1

## 2012-11-13 MED ORDER — CITALOPRAM HYDROBROMIDE 10 MG PO TABS
10.0000 mg | ORAL_TABLET | Freq: Every day | ORAL | Status: DC
  Administered 2012-11-13 – 2012-11-19 (×7): 10 mg via ORAL
  Filled 2012-11-13 (×2): qty 1
  Filled 2012-11-13 (×2): qty 14
  Filled 2012-11-13 (×7): qty 1

## 2012-11-13 MED ORDER — POTASSIUM CHLORIDE CRYS ER 20 MEQ PO TBCR
20.0000 meq | EXTENDED_RELEASE_TABLET | Freq: Two times a day (BID) | ORAL | Status: AC
  Administered 2012-11-13 – 2012-11-14 (×4): 20 meq via ORAL
  Filled 2012-11-13 (×4): qty 1

## 2012-11-13 MED ORDER — ONDANSETRON 4 MG PO TBDP: 4.0000 mg | ORAL_TABLET | Freq: Four times a day (QID) | ORAL | Status: AC | PRN

## 2012-11-13 MED ORDER — MAGNESIUM HYDROXIDE 400 MG/5ML PO SUSP: 30.0000 mL | Freq: Every day | ORAL | Status: DC | PRN

## 2012-11-13 MED ORDER — CHLORDIAZEPOXIDE HCL 25 MG PO CAPS: 25.0000 mg | ORAL_CAPSULE | ORAL | Status: DC

## 2012-11-13 MED ORDER — CHLORDIAZEPOXIDE HCL 25 MG PO CAPS: 25.0000 mg | ORAL_CAPSULE | Freq: Four times a day (QID) | ORAL | Status: AC | PRN

## 2012-11-13 MED ORDER — CHLORDIAZEPOXIDE HCL 25 MG PO CAPS
25.0000 mg | ORAL_CAPSULE | Freq: Every day | ORAL | Status: DC
Start: 2012-11-17 — End: 2012-11-13

## 2012-11-13 MED ORDER — TRAZODONE HCL 50 MG PO TABS
50.0000 mg | ORAL_TABLET | Freq: Every evening | ORAL | Status: DC | PRN
  Administered 2012-11-13 – 2012-11-19 (×7): 50 mg via ORAL
  Filled 2012-11-13: qty 28
  Filled 2012-11-13 (×3): qty 1
  Filled 2012-11-13: qty 28
  Filled 2012-11-13 (×6): qty 1
  Filled 2012-11-13: qty 28
  Filled 2012-11-13 (×2): qty 1
  Filled 2012-11-13: qty 28
  Filled 2012-11-13 (×5): qty 1

## 2012-11-13 MED ORDER — ADULT MULTIVITAMIN W/MINERALS CH
1.0000 | ORAL_TABLET | Freq: Every day | ORAL | Status: DC
  Administered 2012-11-13 – 2012-11-20 (×8): 1 via ORAL
  Filled 2012-11-13 (×11): qty 1

## 2012-11-13 MED ORDER — LOPERAMIDE HCL 2 MG PO CAPS
2.0000 mg | ORAL_CAPSULE | ORAL | Status: AC | PRN
  Administered 2012-11-13: 4 mg via ORAL

## 2012-11-13 MED ORDER — THIAMINE HCL 100 MG/ML IJ SOLN: 100.0000 mg | Freq: Once | INTRAMUSCULAR | Status: DC

## 2012-11-13 MED ORDER — ACETAMINOPHEN 325 MG PO TABS: 650.0000 mg | ORAL_TABLET | Freq: Four times a day (QID) | ORAL | Status: DC | PRN

## 2012-11-13 MED ORDER — CHLORDIAZEPOXIDE HCL 25 MG PO CAPS: 25.0000 mg | ORAL_CAPSULE | Freq: Three times a day (TID) | ORAL | Status: DC

## 2012-11-13 NOTE — [facility] Suicide Risk Assessment (Signed)
Suicide Risk Assessment  Admission Assessment     Nursing information obtained from:  Patient Demographic factors:  Male;Divorced or widowed;Caucasian;Low socioeconomic status;Unemployed Current Mental Status:  NA Loss Factors:  Decline in physical health;Financial problems / change in socioeconomic status Historical Factors:  Family history of mental illness or substance abuse Risk Reduction Factors:  NA  CLINICAL FACTORS:   Depression:   Comorbid alcohol abuse/dependence Alcohol/Substance Abuse/Dependencies  COGNITIVE FEATURES THAT CONTRIBUTE TO RISK:  Closed-mindedness Polarized thinking Thought constriction (tunnel vision)    SUICIDE RISK:   Moderate:  Frequent suicidal ideation with limited intensity, and duration, some specificity in terms of plans, no associated intent, good self-control, limited dysphoria/symptomatology, some risk factors present, and identifiable protective factors, including available and accessible social support.  PLAN OF CARE: Supportive approach/coping skills/relapse prevention                               Identify detox needs                               Reassess the co morbidities  I certify that inpatient services furnished can reasonably be expected to improve the patient's condition.  Terryn Redner A 11/13/2012, 5:33 PM

## 2012-11-13 NOTE — ED Notes (Signed)
Patient DC in stable condition via wheelchair to [facility]

## 2012-11-13 NOTE — Progress Notes (Signed)
Adult Psychoeducational Group Note  Date:  11/13/2012 Time:  10:22 AM  Group Topic/Focus:  Alcoholics Anonymous  Participation Level:  Did Not Attend  Participation Quality:  did not attend  Affect:  did not attend  Cognitive:  Did not attend  Insight: Did not attend  Engagement in Group:  Did not attend  Modes of Intervention: Did not attend  Additional Comments: Did not attend [name] 11/13/2012, 10:22 AM

## 2012-11-13 NOTE — H&P (Signed)
Psychiatric Admission Assessment Adult  Patient Identification:  [name] [name]  Date of Evaluation:  11/13/2012  Chief Complaint:  ETOH DEPENDENCE  History of Present Illness: This is a 62 year old Caucasian male. Admitted to [facility] from the [facility] with complaints of alcohol intoxication and withdrawal symptoms. Patient reports, "The ambulance took me to the [facility] on the 25th of August. That was also my birthday. On that day, I had fallen down on the [address]. I was also drunk at the time. I was having a bad case of diarrhea the same day. While at the hospital, I was told that my body has been washed out while I was living on the streets. I have been homeless x 8 years. The homeless shelter staff are not good to me. I was harassed and thrown out of the shelter when I tried to stay there. People were still drinking and using drugs there. I watch people shoot up there all the time. Right now, I need to be habilitated so that I can quit alcohol for good. I drink every kind of alcohol there is out there, including mouth wash. I drink a lot of mouth wash because it is legal. I drink about 2 pints of liquor daily. I was sober from alcohol for 6 years starting in 1981. The reason why I sobered up was I was fighting for the custody of my only child/ daughter. Alcohol serves a sedative for me. That is how I'm surviving on the streets. I started alcohol at the of 18. I had been to the [facility] in 1981, and also the central [facility] 2-3 months ago. I have had 5 DWI charge against me in the past".  O: Mr. Powe is currently on 1:1 supervision for safety. He uses walker mobility. He reports feeling wobbly on his feet. He has some scabbed areas to his arms. Presents with facial flushing. He mentally alert and sharp.  Elements:  Location:  [facility] adult unit. Quality:  "I feel shaky, wobbly, weak, depression and a lot of anxiety". Severity:  Severe, rated  her anxiety and depression both at #8. Timing:   "I have been drinking very heavily x 1 week". Duration:  "I am a chronic alcoholic since the age of 5". Context:  "I 'm homeless, no job, no money, no support system. I drink alcohol to cope".  Associated Signs/Synptoms:  Depression Symptoms:  depressed mood, hopelessness, anxiety, insomnia, loss of energy/fatigue,  (Hypo) Manic Symptoms:  Impulsivity, Irritable Mood,  Anxiety Symptoms:  Excessive Worry,  Psychotic Symptoms:  Hallucinations: Denies  PTSD Symptoms: Had a traumatic exposure:  None reported  Psychiatric Specialty Exam: Physical Exam  Constitutional: He is oriented to person, place, and time. He appears well-developed.  HENT:  Head: Normocephalic.  Eyes: Pupils are equal, round, and reactive to light.  Neck: Normal range of motion.  Cardiovascular: Normal rate.   Respiratory: Effort normal.  GI: Soft.  Musculoskeletal:  Generalized muscle weakness  Neurological: He is alert and oriented to person, place, and time.  Skin: Skin is warm and dry.  Facial flushing present, Scabbed area to back of left arm    ROS  Blood pressure 104/74, pulse 111, temperature 98.4 F (36.9 C), temperature source Oral, resp. rate 16, height 5\' 5"  (1.651 m), weight 68.947 kg (152 lb), SpO2 97.00%.Body mass index is 25.29 kg/(m^2).  General Appearance: Disheveled  Eye Contact::  Good  Speech:  Clear and Coherent  Volume:  Normal  Mood:  Anxious, Depressed and Hopeless  Affect:  Depressed  Thought Process:  Coherent and Goal Directed  Orientation:  Full (Time, Place, and Person)  Thought Content:  Rumination  Suicidal Thoughts:  No  Homicidal Thoughts:  No  Memory:  Immediate;   Good Recent;   Good Remote;   Good  Judgement:  Impaired  Insight:  Fair  Psychomotor Activity:  Restlessness  Concentration:  Fair  Recall:  Good  Akathisia:  No  Handed:  Right  AIMS (if indicated):     Assets:  Communication Skills Desire  for Improvement  Sleep:       Past Psychiatric History: Diagnosis: Alcohol withdrawal, Alcohol dependence, Major depression  Hospitalizations: [facility]  Outpatient Care:  Substance Abuse Care:  Self-Mutilation: Denies  Suicidal Attempts: Denies attempts, admits thoughts  Violent Behaviors: Denies   Past Medical History:   Past Medical History  Diagnosis Date  . Hypercholesteremia   . Stroke   . Hypertension   . Alcohol abuse   . Seizures   . Depression   . Homelessness    Seizure History:  Seizure Hx Cardiac History:  HTN, Stroke, Hypercholesteremia  Allergies:  No Known Allergies  PTA Medications: No prescriptions prior to admission   Previous Psychotropic Medications:  Medication/Dose  See medication lists               Substance Abuse History in the last 12 months:  yes  Consequences of Substance Abuse: Medical Consequences:  Liver damage, Possible death by overdose Legal Consequences:  Arrests, jail time, Loss of driving privilege. Family Consequences:  Family discord, divorce and or separation.  Social History:  reports that he has been smoking Cigarettes.  He has been smoking about 0.25 packs per day. He does not have any smokeless tobacco history on file. He reports that  drinks alcohol. He reports that he does not use illicit drugs. Additional Social History: History of alcohol / drug use?: Yes Negative Consequences of Use: Financial Withdrawal Symptoms: Agitation  Current Place of Residence: [place], [place]    Place of Birth:  [place]  Family Members: "I have a 28 year old Daughter"  Marital Status:  Single  Children: 1  Sons: 0  Daughters: 1  Relationships: Single  Education:  [employer] Financial pl[employer] Problems/Performance: Completed high school  Religious Beliefs/Practices:  None reported  History of Abuse (Emotional/Phsycial/Sexual): Denies any hx of abuse  Occupational Experiences: English as a second language teacher History:   None.  Legal History: Denies any pending legal charges.  Hobbies/Interests: None reported  Family History:  History reviewed. No pertinent family history.  Results for orders placed during the hospital encounter of 11/10/12 (from the past 72 hour(s))  URINALYSIS, ROUTINE W REFLEX MICROSCOPIC     Status: None   Collection Time    11/10/12 11:33 AM      Result Value Range   Color, Urine YELLOW  YELLOW   APPearance CLEAR  CLEAR   Specific Gravity, Urine 1.020  1.005 - 1.030   pH 5.5  5.0 - 8.0   Glucose, UA NEGATIVE  NEGATIVE mg/dL   Hgb urine dipstick NEGATIVE  NEGATIVE   Bilirubin Urine NEGATIVE  NEGATIVE   Ketones, ur NEGATIVE  NEGATIVE mg/dL   Protein, ur NEGATIVE  NEGATIVE mg/dL   Urobilinogen, UA 0.2  0.0 - 1.0 mg/dL   Nitrite NEGATIVE  NEGATIVE   Leukocytes, UA NEGATIVE  NEGATIVE   Comment: MICROSCOPIC NOT DONE ON URINES WITH NEGATIVE PROTEIN, BLOOD, LEUKOCYTES, NITRITE, OR  GLUCOSE <1000 mg/dL.   Psychological Evaluations:  Assessment:   DSM5:  Schizophrenia Disorders:  NA Obsessive-Compulsive Disorders:  NA Trauma-Stressor Disorders:  NA Substance/Addictive Disorders:  Alcohol Withdrawal (291.81) Depressive Disorders:  Major Depressive Disorder - Moderate (296.22)  AXIS I:  Alcohol withdrawal, Major Depressive Disorder - Moderate (296.22) AXIS II:  Deferred AXIS III:   Past Medical History  Diagnosis Date  . Hypercholesteremia   . Stroke   . Hypertension   . Alcohol abuse   . Seizures   . Depression   . Homelessness    AXIS IV:  economic problems, housing problems, occupational problems, other psychosocial or environmental problems and Chronic alcoholism AXIS V:  Persistence dangerous to self and others  Treatment Plan/Recommendations: 1. Admit for crisis management and stabilization, estimated length of stay 3-5 days.  2. Medication management to reduce current symptoms to base line and improve the patient's overall level of functioning; (a). Start  Citalopram 10 mg Q hs for depression.                                 (b). Discontinue Librium treatment protocol, continue Librium 25 mg Q 6 hrs prn. 3. Treat health problems as indicated.  4. Develop treatment plan to decrease risk of relapse upon discharge and the need for readmission.  5. Psycho-social education regarding relapse prevention and self care.  6. Health care follow up as needed for medical problems.  7. Review, reconcile, and reinstate any pertinent home medications for other health issues where appropriate. 8. Call for consults with hospitalist for any additional specialty patient care services as needed.  Treatment Plan Summary: Daily contact with patient to assess and evaluate symptoms and progress in treatment Medication management Supportive approach/coping skills/relapse prevention Identify detox needs/address the co morbidities Current Medications:  Current Facility-Administered Medications  Medication Dose Route Frequency Provider Last Rate Last Dose  . acetaminophen (TYLENOL) tablet 650 mg  650 mg Oral Q6H PRN Kerry Hough, PA-C      . alum & mag hydroxide-simeth (MAALOX/MYLANTA) 200-200-20 MG/5ML suspension 30 mL  30 mL Oral Q4H PRN Kerry Hough, PA-C      . chlordiazePOXIDE (LIBRIUM) capsule 25 mg  25 mg Oral Q6H PRN Kerry Hough, PA-C      . chlordiazePOXIDE (LIBRIUM) capsule 25 mg  25 mg Oral QID Kerry Hough, PA-C   25 mg at 11/13/12 0820   Followed by  . [START ON 11/14/2012] chlordiazePOXIDE (LIBRIUM) capsule 25 mg  25 mg Oral TID Kerry Hough, PA-C       Followed by  . [START ON 11/15/2012] chlordiazePOXIDE (LIBRIUM) capsule 25 mg  25 mg Oral BH-qamhs Spencer E Simon, PA-C       Followed by  . [START ON 11/17/2012] chlordiazePOXIDE (LIBRIUM) capsule 25 mg  25 mg Oral Daily Kerry Hough, PA-C      . hydrOXYzine (ATARAX/VISTARIL) tablet 25 mg  25 mg Oral Q6H PRN Kerry Hough, PA-C      . loperamide (IMODIUM) capsule 2-4 mg  2-4 mg Oral PRN  Kerry Hough, PA-C      . magnesium hydroxide (MILK OF MAGNESIA) suspension 30 mL  30 mL Oral Daily PRN Kerry Hough, PA-C      . multivitamin with minerals tablet 1 tablet  1 tablet Oral Daily Kerry Hough, PA-C   1 tablet at 11/13/12 0820  . ondansetron (ZOFRAN-ODT) disintegrating  tablet 4 mg  4 mg Oral Q6H PRN Kerry Hough, PA-C      . potassium chloride SA (K-DUR,KLOR-CON) CR tablet 20 mEq  20 mEq Oral BID Kerry Hough, PA-C   20 mEq at 11/13/12 0820  . thiamine (B-1) injection 100 mg  100 mg Intramuscular Once Kerry Hough, PA-C      . [START ON 11/14/2012] thiamine (VITAMIN B-1) tablet 100 mg  100 mg Oral Daily Spencer E Simon, PA-C      . traZODone (DESYREL) tablet 50 mg  50 mg Oral QHS,MR X 1 Kerry Hough, PA-C        Observation Level/Precautions:  Currently on 1:1 supervision for safety  Laboratory:  Reviewed ED lab findings on file. BAL: 282  Psychotherapy: Group sessions, AA/NA meetings    Medications:  See medication lists  Consultations: As needed   Discharge Concerns: Maintaining sobriety   Estimated LOS: 3-5 days  Other:     I certify that inpatient services furnished can reasonably be expected to improve the patient's condition.   Sanjuana Kava, PMHNP 8/28/201411:14 AM Agree with assessment and plan Reymundo Poll. Dub Mikes, M.D.

## 2012-11-13 NOTE — Progress Notes (Signed)
Recreation Therapy Notes  Date: 08.28.2014 Time: 3:00pm Location: [address] Dayroom  Group Topic: Leisure Education  Goal Area(s) Addresses:  Patient will verbalize impact of positive leisure on sobriety. Patient will verbalize impact of leisure on self-esteem.  Behavioral Response: Did not attend  Jearl Klinefelter, LRT/CTRS  Jearl Klinefelter 11/13/2012 4:49 PM

## 2012-11-13 NOTE — Progress Notes (Signed)
D: Pt did not fill out self inventory sheet, pt is not attending groups because he wants to rest, pt denies SI/HI/AVH, is pleasant with staff  A:  Emotional support provided, Encouraged pt to continue with treatment plan and attend all group activities, q15 min checks maintained for safety.  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

## 2012-11-13 NOTE — Consult Note (Signed)
Note reviewed and agreed with  

## 2012-11-13 NOTE — Progress Notes (Signed)
The focus of this group is to educate the patient on the purpose and policies of crisis stabilization and provide a format to answer questions about their admission.  The group details unit policies and expectations of patients while admitted.  Patient did not attend the group this morning.  

## 2012-11-13 NOTE — [facility] Group Notes (Signed)
[facility] LCSW Group Therapy  11/13/2012 2:15 PM  Type of Therapy:  Group Therapy  Participation Level:  Did Not Attend  Smart, Setareh Rom 11/13/2012, 2:15 PM  

## 2012-11-13 NOTE — Progress Notes (Signed)
Patient ID: [name] [name], male   DOB: [birth year], 62 y.o.   MRN: [id number] D: pt. In BR being bathe, c/o diarrhea. A: Writer administered Imodium for diarrhea. Staff maintaining 1:1 for safety. R: Pt. Is safe on the unit.

## 2012-11-13 NOTE — Progress Notes (Signed)
1:1 Note-pt asleep in bed, sitter at bedside, pt is calm no distress, 1:1 continued for safety/high fall risk.

## 2012-11-13 NOTE — Progress Notes (Signed)
[facility] Group Notes:  (Nursing/MHT/Case Management/Adjunct)  Date:  11/13/2012  Time:  10:07 PM  Type of Therapy:  Group Therapy  Participation Level:  Did Not Attend  Participation Quality:  Did Not Attend  Affect:  Did Not Attend  Cognitive:  Did Not Attend  Insight:  None  Engagement in Group:  Did Not Attend  Modes of Intervention:  Did Not Attend  Summary of Progress/Problems: Pt. Was resting in bed.  Sondra Come 11/13/2012, 10:07 PM

## 2012-11-13 NOTE — Progress Notes (Addendum)
Patient ID: [name] [name], male   DOB: March 05, 1951, 62 y.o.   MRN: [id number] Pt. Is 62 year old male admitted for ETOH detox, pt. Reports he was taken to [facility] hospital [place] Veterinary surgeon. Pt. Said he had diarrhea and had been drinking. Pt. Reports he drinks 2 pint of liquor a day. Pt. Is alert, but slow to respond at times. He is animated at times. Pt. Is homeless says he's been homeless for years. Pt. Reports recent hospitalizations at [facility] and [facility] in [place]. Pt. Denies SHI. Pt. Has abrasion on left posterior arm near elbow. Pt. Has fungal looking toes, cracked callous feet. Pt. Has what appears to be an insect bite scabbed area to the left lateral back, also scabs of old sores to knee bilaterally. Pt. Is unsteady rolling in with a four wheel walker, which he says was given to him at [facility](in storage room) Pt. Has medical hx. Of CVA notable weakness to right side and HTN. Pt. Unsteady, maintains that he also has arthritis in hips. Pt. Made a 1:1 for  after staffing with Dianna Limbo, PA. Pt. Given food/drink and assisted to bed.

## 2012-11-13 NOTE — Progress Notes (Signed)
1:1 Note-Pt sleeping, easily arousable, cooperative with taking meds, pt states that he is very tired and not up to attending group, encouraged pt to attend groups, sitter at bedside, no complaints at this time, 1:1 continued for safety.

## 2012-11-13 NOTE — [facility] Suicide Risk Assessment (Signed)
[facility] INPATIENT: Family/Significant Other Suicide Prevention Education  Suicide Prevention Education:  Education Completed; No one has been identified by the patient as the family member/significant other with whom the patient will be residing, and identified as the person(s) who will aid the patient in the event of a mental health crisis (suicidal ideations/suicide attempt).   Pt did not c/o SI at admission, nor have they endorsed SI during their stay here. SPE not required. SPI pamphlet provided to pt and he was encouraged to share with his support system.   [employer], LCSWA 11/13/2012 10:18 AM

## 2012-11-13 NOTE — Progress Notes (Signed)
1:1 Note-Pt ate dinner in room, sat up on side of bed, pt ambulating in hallway with walker assistance and with sitter at his side, no complaints at this time, CIWA-0, 1:1 continued for safety.

## 2012-11-14 DIAGNOSIS — F329 Major depressive disorder, single episode, unspecified: Secondary | ICD-10-CM

## 2012-11-14 DIAGNOSIS — F1010 Alcohol abuse, uncomplicated: Secondary | ICD-10-CM

## 2012-11-14 NOTE — Progress Notes (Addendum)
[facility] MD Progress Note  11/14/2012 2:07 PM [name] [name]  MRN:  [id number]  Subjective:  [name] [name] reports, "I had a bad night because of loose stools. I'm also unsteady on my feet. I'm still craving alcohol and cigarettes. I need a place to live, or I will go on the streets and resume drinking alcohol. My mood is not good because my homeless situation is bad. If I have a home, I would not have to drink any more alcohol. It is hard to cope and survive on the streets. That is why I drink".  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 Hypercholesteremia   . Stroke   . Hypertension   . Alcohol abuse   . Seizures   . Depression   . Homelessness    Axis IV: economic problems, housing problems, occupational problems, other psychosocial or environmental problems and Alcoholism Axis V: 41-50 serious symptoms  ADL's:  Impaired  Sleep: Good  Appetite:  Good  Suicidal Ideation:  Plan:  Denies Intent:  denies Means:  Denies Homicidal Ideation:  Plan:  Denies Intent:  denies Means:  Denies AEB (as evidenced by):  Psychiatric Specialty Exam: Review of Systems  Constitutional: Negative.   HENT: Negative.   Eyes: Negative.   Respiratory: Negative.   Cardiovascular: Negative.   Gastrointestinal: Negative.   Genitourinary: Negative.   Musculoskeletal: Positive for myalgias, joint pain and falls (Hx of and rsiks for fall/related injuries).  Skin: Negative for itching and rash.       Some redness to facial  areas. Scabbed areas to back of left arm   Neurological: Negative.   Endo/Heme/Allergies: Negative.   Psychiatric/Behavioral: Positive for depression (Currently being stabilized with medication) and substance abuse (Alcholism). Negative for suicidal ideas, hallucinations and memory loss. The patient is nervous/anxious (Currently being stabilized with medication) and has insomnia (Currently being stabilized with medication).     Blood pressure 110/77, pulse 102, temperature 97.4 F (36.3 C), temperature source Oral, resp. rate 18, height 5\' 5"  (1.651 m), weight 68.947 kg (152 lb), SpO2 97.00%.Body mass index is 25.29 kg/(m^2).  General Appearance: Disheveled and talkative  Eye Contact::  Good  Speech:  Clear and Coherent and talkative  Volume:  Normal  Mood:  Depressed and Hopeless  Affect:  Appropriate and Non-Congruent with depressed mood  Thought Process:  Coherent, Goal Directed and Intact  Orientation:  Full (Time, Place, and Person)  Thought Content:  Rumination  Suicidal Thoughts:  No  Homicidal Thoughts:  No  Memory:  Immediate;   Good Recent;   Good Remote;   Good  Judgement:  Fair  Insight:  Good  Psychomotor Activity:  unsteady gait/balance  Concentration:  Fair  Recall:  Good  Akathisia:  No  Handed:  Right  AIMS (if indicated):     Assets:  Communication Skills Desire for Improvement  Sleep:  Number of Hours: 6.25   Current Medications: Current Facility-Administered Medications  Medication Dose Route Frequency Provider Last Rate Last Dose  . acetaminophen (TYLENOL) tablet 650 mg  650 mg Oral Q6H PRN Kerry Hough, PA-C      .  alum & mag hydroxide-simeth (MAALOX/MYLANTA) 200-200-20 MG/5ML suspension 30 mL  30 mL Oral Q4H PRN Kerry Hough, PA-C      . chlordiazePOXIDE (LIBRIUM) capsule 25 mg  25 mg Oral Q6H PRN Kerry Hough, PA-C      . citalopram (CELEXA) tablet 10 mg  10 mg Oral Q2000 Sanjuana Kava, NP   10 mg at 11/13/12 2052  . hydrOXYzine  (ATARAX/VISTARIL) tablet 25 mg  25 mg Oral Q6H PRN Kerry Hough, PA-C      . lisinopril (PRINIVIL,ZESTRIL) tablet 10 mg  10 mg Oral Daily Sanjuana Kava, NP   10 mg at 11/14/12 0837  . loperamide (IMODIUM) capsule 2-4 mg  2-4 mg Oral PRN Kerry Hough, PA-C   4 mg at 11/13/12 2052  . magnesium hydroxide (MILK OF MAGNESIA) suspension 30 mL  30 mL Oral Daily PRN Kerry Hough, PA-C      . multivitamin with minerals tablet 1 tablet  1 tablet Oral Daily Kerry Hough, PA-C   1 tablet at 11/14/12 1610  . ondansetron (ZOFRAN-ODT) disintegrating tablet 4 mg  4 mg Oral Q6H PRN Kerry Hough, PA-C      . potassium chloride SA (K-DUR,KLOR-CON) CR tablet 20 mEq  20 mEq Oral BID Kerry Hough, PA-C   20 mEq at 11/14/12 0837  . thiamine (B-1) injection 100 mg  100 mg Intramuscular Once [employer], PA-C      . thiamine (VITAMIN B-1) tablet 100 mg  100 mg Oral Daily Kerry Hough, PA-C   100 mg at 11/14/12 0837  . traZODone (DESYREL) tablet 50 mg  50 mg Oral QHS,MR X 1 Kerry Hough, PA-C   50 mg at 11/13/12 2225    Lab Results: No results found for this or any previous visit (from the past 48 hour(s)).  Physical Findings: AIMS: Facial and Oral Movements Muscles of Facial Expression: None, normal Lips and Perioral Area: None, normal Jaw: None, normal Tongue: None, normal,Extremity Movements Upper (arms, wrists, hands, fingers): None, normal Lower (legs, knees, ankles, toes): None, normal, Trunk Movements Neck, shoulders, hips: None, normal, Overall Severity Severity of abnormal movements (highest score from questions above): None, normal Incapacitation due to abnormal movements: None, normal Patient's awareness of abnormal movements (rate only patient's report): No Awareness, Dental Status Current problems with teeth and/or dentures?: No Does patient usually wear dentures?: No  CIWA:  CIWA-Ar Total: 4 COWS:     Treatment Plan Summary: Daily contact with patient to assess and  evaluate symptoms and progress in treatment Medication management  Plan: Supportive approach/coping skills/relapse prevention. Continue 1:1 supervision for safety Encouraged out of room, participation in group sessions and application of coping skills when distressed. Will continue to monitor response to/adverse effects of medications in use to assure effectiveness. Continue to monitor mood, behavior and interaction with staff and other patients. Discharge plans in progress. Continue current plan of care.  Medical Decision Making Problem Points:  Review of last therapy session (1) and Review of psycho-social stressors (1) Data Points:  Review of medication regiment & side effects (2) Review of new medications or change in dosage (2)  I certify that inpatient services furnished can reasonably be expected to improve the patient's condition.   Armandina Stammer I, PMHNP-BC 11/14/2012, 2:07 PM

## 2012-11-14 NOTE — Progress Notes (Signed)
Psychoeducational Group Note  Date:  11/14/2012 Time:  2000  Group Topic/Focus:  Wrap-Up Group:   The focus of this group is to help patients review their daily goal of treatment and discuss progress on daily workbooks.  Participation Level: Did Not Attend  Participation Quality:  Not Applicable  Affect:  Not Applicable  Cognitive:  Not Applicable  Insight:  Not Applicable  Engagement in Group: Not Applicable  Additional Comments:    Flonnie Hailstone 11/14/2012, 9:58 PM

## 2012-11-14 NOTE — BHH Counselor (Signed)
Adult Comprehensive Assessment  Patient ID: [name] [name], male   DOB: 1950/04/06, 62 y.o.   MRN: [id number]  Information Source: Information source: Patient  Current Stressors:  Educational / Learning stressors: Some colleg Employment / Job issues: Unemployed/no disability or unemployment benefits Family Relationships: strained. No contact with daughter or other family members at this time. Financial / Lack of resources (include bankruptcy): no funds available until October 22nd Housing / Lack of housing: chronically homeless Physical health (include injuries & life threatening diseases): unstable walking/uses walker; shakiness. high blood pressure Social relationships: limited. some church friends and supportive people.  Substance abuse: 2 pints of mouth wash daily for several weeks/months Bereavement / Loss: mother passed away several years ago but this is still impacting pt/unresolved grief.   Living/Environment/Situation:  Living Arrangements: Alone Living conditions (as described by patient or guardian): homeless for past 8 years.  How long has patient lived in current situation?: 7-8 years What is atmosphere in current home: Chaotic  Family History:  Marital status: Divorced Divorced, when?: pt refused to talk about this What types of issues is patient dealing with in the relationship?: pt refused to answer Additional relationship information: pt refused to answer Does patient have children?: Yes How many children?: 1 How is patient's relationship with their children?: 68 year old daughter. No contact with her. Last seen at grandmother's funeral .  Childhood History:  Additional childhood history information: refused to answer Description of patient's relationship with caregiver when they were a child: refused to answer Patient's description of current relationship with people who raised him/her: refused to answer Does patient have siblings?: No Did patient suffer any  verbal/emotional/physical/sexual abuse as a child?: No Did patient suffer from severe childhood neglect?: No Has patient ever been sexually abused/assaulted/raped as an adolescent or adult?: No Was the patient ever a victim of a crime or a disaster?: No Witnessed domestic violence?: No Has patient been effected by domestic violence as an adult?: No  Education:  Highest grade of school patient has completed: some college Currently a Consulting civil engineer?: No Learning disability?: No  Employment/Work Situation:   Employment situation: Unemployed Patient's job has been impacted by current illness: Yes Describe how patient's job has been impacted: unable to function in society What is the longest time patient has a held a job?: 10 years 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  Has patient ever been in the [employer]?: No Has patient ever served in combat?: No  Financial Resources:   Financial resources: No income (October 22nd--Social Security begins) Does patient have a Lawyer or guardian?: No  Alcohol/Substance Abuse:   What has been your use of drugs/alcohol within the last 12 months?: 2 pints of mouthwash daily. No drug use identified. If attempted suicide, did drugs/alcohol play a role in this?: Yes (June 2006. Planned to slit wrists but changed his mind.) Alcohol/Substance Abuse Treatment Hx: Past Tx, Inpatient If yes, describe treatment: [facility]; [facility] in [place], [place] [facility]  Has alcohol/substance abuse ever caused legal problems?: No  Social Support System:   Forensic psychologist System: Poor Describe Community Support System: few church friends that are helpful.  Type of faith/religion: Christian-Methodist How does patient's faith help to cope with current illness?: goes to [employer] support.   Leisure/Recreation:   Leisure and Hobbies: Go to park  Strengths/Needs:   What things does the patient do  well?: resilient; smart In what areas does patient struggle / problems for patient: finding stable  place to stay; getting along with people.   Discharge Plan:   Does patient have access to transportation?: Yes (bus) Will patient be returning to same living situation after discharge?: No Plan for living situation after discharge: csw assessing for appropriate referrals.  Currently receiving community mental health services: No If no, would patient like referral for services when discharged?: Yes (What county?) [facility]) Does patient have financial barriers related to discharge medications?: Yes Patient description of barriers related to discharge medications: pt has no funds coming in until January 07, 2013.   Summary/Recommendations:    Pt is 62 year old male who reports being homeless in [place]. Pt presents to [facility] for treatment for depression, ETOH detox, and medication stabilzation. Pt reports that he has been homeless for 8 years and had recently been to [facility]. Pt reports that he does not follow up with any mental health providers currently. Recommendations for pt include: crisis stabilization, therapeutic milieu, encourage group attendance and participation, librium taper for withdrawals, medication management for mood stabilization, and development of comprehensive mental wellnes/sobriety/safe living plan.   Smart, Research scientist (physical sciences). 11/14/2012

## 2012-11-14 NOTE — Progress Notes (Signed)
NUTRITION ASSESSMENT  Pt identified as at risk on the Malnutrition Screen Tool  INTERVENTION: 1. Educated patient on the importance of nutrition and encouraged intake of food and beverages. 2. Discussed weight goals. 3.  Continue MVI and thiamine.  NUTRITION DIAGNOSIS: Unintentional weight loss related to sub-optimal intake as evidenced by pt report.   Goal: Pt to meet >/= 90% of their estimated nutrition needs.  Monitor:  PO intake  Assessment:  Patient admitted for ETOH detox and MDD.   Homeless for the past 8 years.  Was drinking mouthwash.  Spoke with patient who reported good intake currently.  UBW 170 lbs 10 years ago when he was working.4% weight loss in the past 8 months.  [employer] but homeless.  62 y.o. male  Height: Ht Readings from Last 1 Encounters:  11/13/12 5\' 5"  (1.651 m)    Weight: Wt Readings from Last 1 Encounters:  11/13/12 152 lb (68.947 kg)    Weight Hx: Wt Readings from Last 10 Encounters:  11/13/12 152 lb (68.947 kg)  04/27/12 156 lb 12 oz (71.1 kg)  04/23/12 154 lb 4.8 oz (69.99 kg)  03/10/12 159 lb 2.8 oz (72.201 kg)    BMI:  Body mass index is 25.29 kg/(m^2). Pt meets criteria for overweight based on current BMI.  Estimated Nutritional Needs: Kcal: 25-30 kcal/kg Protein: > 1 gram protein/kg Fluid: 1 ml/kcal  Diet Order: General Pt is also offered choice of unit snacks mid-morning and mid-afternoon.  Pt is eating as desired.   Lab results and medications reviewed.   Oran Rein, RD, LDN Clinical Inpatient Dietitian Pager:  [phone] Weekend and after hours pager:  [phone]  2

## 2012-11-14 NOTE — Progress Notes (Signed)
Adult Psychoeducational Group Note  Date:  11/14/2012 Time:  12:50 PM  Group Topic/Focus:  Recovery Goals:   The focus of this group is to identify appropriate goals for recovery and establish a plan to achieve them.  Participation Level:  Active  Participation Quality:  Appropriate  Affect:  Appropriate  Cognitive:  Appropriate  Insight: Appropriate  Engagement in Group:  Engaged  Modes of Intervention:  Activity and Discussion  Additional Comments:  Pt where taught how to write SMART goals. Pt did identify goal.   [name] [name] 11/14/2012, 12:50 PM

## 2012-11-14 NOTE — Tx Team (Signed)
Interdisciplinary Treatment Plan Update (Adult)  Date: 11/14/2012   Time Reviewed: 10:01 AM  Progress in Treatment:  Attending groups: Intermittently  Participating in groups: Minimally  Taking medication as prescribed: Yes  Tolerating medication: Yes  Family/Significant othe contact made: No. SPE not required for this pt as he did not endorse SI during admission or during stay.   Patient understands diagnosis: Yes, AEB seeking treatment for substance abuse and depression.  Discussing patient identified problems/goals with staff: Yes  Medical problems stabilized or resolved: Yes  Denies suicidal/homicidal ideation: Yes, in group/self report.  Patient has not harmed self or Others: Yes  New problem(s) identified: Pt high fall risk and is on 1:1. Discharge Plan or Barriers: Pt reported that he is homeless. He recently discharged from [facility]. He does not follow up with any mental health providers at this time. CSW assessing for appropriate referrals.  Additional comments:[name] [name] is an 62 y.o. male who presents to [name] after a fall due to alcohol intoxication. CSW met with pt to assess patient needs and complete bhh assessment. Pt reports that he is homeless and wants to complete detox in order to turn his life around. Pt reports, "Today is my 62nd birthday and I can't keep living like this or I'm going to end up dying for drinking." Patient reports that he wants to complete detox, and complete a treatment program. Pt reports that he is waiting to start receiving his ss check on October 27th. Per patient, his friends at a [employer], [name] [name] and [name] [name] assisted with helping set up patient a checking account at [facility] in [place].  Pt denies Si/HI/Ah/VH. Pt does report depressive symptoms including, hopelessness, feelings of worthlessness, guilt, and tearfulness. Pt reprots drinking 2-3 pints of mouth wash in order to get drunk daily   Reason for Continuation of  Hospitalization: Medication management Mood stabilization Estimated length of stay: 3-4 days  For review of initial/current patient goals, please see plan of care.  Attendees:  Patient:    Family:    Physician: [name] Lyons MD 11/14/2012 10:00 AM   Nursing: [name] Chester RN  11/14/2012 10:00 AM   Clinical Social Worker [name] [name], LCSWA  11/14/2012 10:00 AM   Other: [name] [name]  11/14/2012 10:00 AM   Other: [name] [name] Nurse CM 11/14/2012 10:00 AM   Other:    Other:    Scribe for Treatment Team:  [name] [name] LCSWA 11/14/2012 10:00 AM

## 2012-11-14 NOTE — Progress Notes (Signed)
RN 1:1 Note  D: Patient reports that he is concerned about his belongings and waiting for a call back from the facility to see if they are there; patient reports no needs or concerns at this time;   A: Patient on 1:1 observation; patient encouraged to participate; patient monitored for any signs and symptoms of withdrawal  R: Patient is consuming food and drink appropriately; patient is has no concerns at this time; patient has been attending all groups today and engaging

## 2012-11-14 NOTE — BHH Group Notes (Signed)
[facility] LCSW Aftercare Discharge Planning Group Note   11/14/2012 9:35 AM  Participation Quality:  Appropriate  Mood/Affect:  Depressed  Depression Rating:  10  Anxiety Rating:  10  Thoughts of Suicide:  No Will you contract for safety?   NA  Current AVH:  No  Plan for Discharge/Comments:  Pt stated that he has been homeless for over 8 years. He recently went to [facility] for treatment, but relapsed after being discharged. PT stated that he is waiting for Social Security money that should be coming in beginning Oct. 22nd. He is in need of placement and/or aftercare i/p if possible.   Transportation Means: unknown  Supports: "I have noone. I have no family."   [name] [name], [name] [name]

## 2012-11-14 NOTE — Progress Notes (Signed)
RN 1:1 Note  D: Patient reports that he has no needs or complaints at this time  A: Patient encouraged to attend groups and ambulate   R: Patient has no reports of diarrhea and no complaints at this time; patient has no acute needs at this time

## 2012-11-14 NOTE — Progress Notes (Signed)
RN 1:1 Note   D: Patient denies SI/HI and A/V hallucinations; patient reports diarrhea and tremors and concerned about where he is going to go after discharge since he is homeless  A: Monitored q 15 minutes; patient encouraged to attend groups; patient educated about medications; patient given medications per physician orders; patient encouraged to express feelings and/or concerns  R: Patient is irritable but cooperative; tremors can be felt and patient has no had any more bowel movements since last night but nurse will continue to monitor; patient's interaction with staff and peers is appropriate;; patient is taking medications as prescribed and tolerating medications; patient is attending groups but forwards little except about placement after discharge

## 2012-11-14 NOTE — BHH Group Notes (Addendum)
[facility] LCSW Group Therapy  11/14/2012 2:17 PM  Type of Therapy:  Group Therapy  Participation Level:  Active  Participation Quality:  Attentive  Affect:  Appropriate  Cognitive:  Alert  Insight:  Engaged  Engagement in Therapy:  Improving  Modes of Intervention:  Discussion, Education, Exploration, Socialization and Support  Summary of Progress/Problems: Feelings around Relapse. Group members discussed the meaning of relapse and shared personal stories of relapse, how it affected them and others, and how they perceived themselves during this time. Group members were encouraged to identify triggers, warning signs and coping skills used when facing the possibility of relapse. Social supports were discussed and explored in detail. Post Acute Withdrawal Syndrome (handout provided) was introduced and examined. Pt's were encouraged to ask questions, talk about key points associated with PAWS, and process this information in terms of relapse prevention. [name] was attentive and engaged throughout today's group. He shows progress in the group setting AEB his level of participation and ability to contribute to the group discussion. [name] tended to deviate from the topic but was easily redirectable by CSW. [name] identified his primary trigger as "not having a stable home. I've been homeless for 7 years." [name] stated that he plans to work with social services and his social worker in order to help himself get what he needs to survive. [name] also stated that he plans to eventually reach out to family members in order to increase his support network.   Smart, Joshual Terrio 11/14/2012, 2:17 PM

## 2012-11-15 NOTE — Progress Notes (Signed)
Pt is unsteady on his feet and is a high fall risk   Pt on 1:1 for safety and is safe at present

## 2012-11-15 NOTE — Progress Notes (Signed)
[facility] MD Progress Note  11/15/2012 1:00 PM Timm Bonenberger  MRN:  [id number] Subjective:  [name] [name] is sitting up on the edge of his bed talking with his sitter. He reports that he is beginning to feel better physically. He is eating well, and he slept well last night after taking trazodone. He denies any cravings today for alcohol, but continues to crave cigarettes. He expresses a misgiving for wearing a nicotine patch as he has been told by a doctor that will cause his bones to become brittle. He denies any suicidal or homicidal ideation. He denies any auditory or visual hallucinations. He is hoping that we can help him with housing so you'll no longer be homeless.  Diagnosis:   DSM5: Schizophrenia Disorders:   Obsessive-Compulsive Disorders:   Trauma-Stressor Disorders:   Substance/Addictive Disorders:  Alcohol Related Disorder - Severe (303.90) Depressive Disorders:  Major Depressive Disorder - Moderate (296.22)  Axis I: Alcohol Abuse, Major Depression, single episode and Substance Induced Mood Disorder Axis II: Deferred Axis III:  Past Medical History  Diagnosis Date  . Hypercholesteremia   . Stroke   . Hypertension   . Alcohol abuse   . Seizures   . Depression   . Homelessness     ADL's:  Intact  Sleep: Good  Appetite:  Good  Suicidal Ideation:  Patient denies any thought, plan, or intent Homicidal Ideation:  Patient denies any thought, plan, or intent AEB (as evidenced by):  Psychiatric Specialty Exam: Review of Systems  Constitutional: Negative.   HENT: Negative.   Eyes: Negative.   Respiratory: Negative.   Cardiovascular: Negative.   Gastrointestinal: Negative.   Genitourinary: Negative.   Musculoskeletal: Negative.   Skin: Negative.   Neurological: Negative.   Endo/Heme/Allergies: Negative.   Psychiatric/Behavioral: Negative.     Blood pressure 117/81, pulse 93, temperature 97.4 F (36.3 C), temperature source Oral, resp. rate 19, height 5\' 5"  (1.651 m),  weight 68.947 kg (152 lb), SpO2 97.00%.Body mass index is 25.29 kg/(m^2).  General Appearance: Casual  Eye Contact::  Good  Speech:  Clear and Coherent  Volume:  Normal  Mood:  Dysphoric  Affect:  Congruent  Thought Process:  Linear and Tangential  Orientation:  Full (Time, Place, and Person)  Thought Content:  WDL  Suicidal Thoughts:  No  Homicidal Thoughts:  No  Memory:  Immediate;   Fair Recent;   Fair Remote;   Poor  Judgement:  Fair  Insight:  Fair  Psychomotor Activity:  Normal  Concentration:  Good  Recall:  Good  Akathisia:  No  Handed:  Right  AIMS (if indicated):     Assets:  Communication Skills Desire for Improvement  Sleep:  Number of Hours: 5.75   Current Medications: Current Facility-Administered Medications  Medication Dose Route Frequency Provider Last Rate Last Dose  . acetaminophen (TYLENOL) tablet 650 mg  650 mg Oral Q6H PRN Kerry Hough, PA-C      . alum & mag hydroxide-simeth (MAALOX/MYLANTA) 200-200-20 MG/5ML suspension 30 mL  30 mL Oral Q4H PRN Kerry Hough, PA-C      . chlordiazePOXIDE (LIBRIUM) capsule 25 mg  25 mg Oral Q6H PRN Kerry Hough, PA-C      . citalopram (CELEXA) tablet 10 mg  10 mg Oral Q2000 Sanjuana Kava, NP   10 mg at 11/14/12 2306  . hydrOXYzine (ATARAX/VISTARIL) tablet 25 mg  25 mg Oral Q6H PRN Kerry Hough, PA-C      . lisinopril (PRINIVIL,ZESTRIL) tablet 10 mg  10 mg Oral Daily Sanjuana Kava, NP   10 mg at 11/15/12 4540  . loperamide (IMODIUM) capsule 2-4 mg  2-4 mg Oral PRN Kerry Hough, PA-C   4 mg at 11/13/12 2052  . magnesium hydroxide (MILK OF MAGNESIA) suspension 30 mL  30 mL Oral Daily PRN Kerry Hough, PA-C      . multivitamin with minerals tablet 1 tablet  1 tablet Oral Daily Kerry Hough, PA-C   1 tablet at 11/15/12 9811  . ondansetron (ZOFRAN-ODT) disintegrating tablet 4 mg  4 mg Oral Q6H PRN Kerry Hough, PA-C      . thiamine (B-1) injection 100 mg  100 mg Intramuscular Once [employer], PA-C       . thiamine (VITAMIN B-1) tablet 100 mg  100 mg Oral Daily Kerry Hough, PA-C   100 mg at 11/15/12 9147  . traZODone (DESYREL) tablet 50 mg  50 mg Oral QHS,MR X 1 Kerry Hough, PA-C   50 mg at 11/14/12 2307    Lab Results: No results found for this or any previous visit (from the past 48 hour(s)).  Physical Findings: AIMS: Facial and Oral Movements Muscles of Facial Expression: None, normal Lips and Perioral Area: None, normal Jaw: None, normal Tongue: None, normal,Extremity Movements Upper (arms, wrists, hands, fingers): None, normal Lower (legs, knees, ankles, toes): None, normal, Trunk Movements Neck, shoulders, hips: None, normal, Overall Severity Severity of abnormal movements (highest score from questions above): None, normal Incapacitation due to abnormal movements: None, normal Patient's awareness of abnormal movements (rate only patient's report): No Awareness, Dental Status Current problems with teeth and/or dentures?: No Does patient usually wear dentures?: No  CIWA:  CIWA-Ar Total: 2 COWS:     Treatment Plan Summary: Daily contact with patient to assess and evaluate symptoms and progress in treatment Medication management Research residential options  Plan: Continue current plan and discuss options for aftercare and residential placement  Medical Decision Making Problem Points:  Established problem, stable/improving (1), Review of last therapy session (1) and Review of psycho-social stressors (1) Data Points:  Review or order clinical lab tests (1) Review of medication regiment & side effects (2)  I certify that inpatient services furnished can reasonably be expected to improve the patient's condition.   Stephen Turnbaugh 11/15/2012, 1:00 PM

## 2012-11-15 NOTE — Progress Notes (Signed)
Psychoeducational Group Note  Date:  11/15/2012 Time:  0945 am  Group Topic/Focus:  Identifying Needs:   The focus of this group is to help patients identify their personal needs that have been historically problematic and identify healthy behaviors to address their needs.  Participation Level:  Minimal  Participation Quality:  Appropriate  Affect:  Appropriate  Cognitive:  Appropriate  Insight:  Improving  Engagement in Group:  Improving  Additional Comments:    Andrena Mews 11/15/2012,3:42 PM

## 2012-11-15 NOTE — Progress Notes (Signed)
D   Pt has been up this morning alert and oriented   He was walking with his walker  He is shakey and has generalized weakness   He is a high fall risk   He attended groups and participated A  Verbal support given   Medications administered and effectiveness monitored   1:1 sitter R   Pt safe at present

## 2012-11-15 NOTE — Progress Notes (Signed)
Patient ID: [name] [name], male   DOB: 16-Nov-1950, 62 y.o.   MRN: [id number] D)  Was resting earlier, slept through group.  Awakened later and sat on side of bed for a few minutes talking with tech before getting up and coming out to the med window for hs meds.  Has been pleasant and cooperative, denies pain but feeling depressed, but feeling more hopeful after a couple from church has been helping him get social security benefits arranged, disability.  Talked about living outside for nearly 8 years, hopeful that he can finally get other arrangements made.  Stated isn't having w/d sx currently, and the diarrhea he had had is better for now.  Pleasant, brighter, has been walking with rolling walker d/t rt side weakness from CVA.Marland Kitchen A)  Remains on 1:1 obs for safety d/t high fall risk, safety, MHT at bedside.  Will continue to monitor for safety, continue POC R)  Safety maintained, appreciative.

## 2012-11-15 NOTE — BHH Group Notes (Signed)
B[facility] Group Notes:  (Nursing/MHT/Case Management/Adjunct)  Date:  11/15/2012  Time:  12:32 PM  Type of Therapy:  Psychoeducational Skills  Participation Level:  Active  Participation Quality:  Appropriate  Affect:  Appropriate  Cognitive:  Appropriate  Insight:  Appropriate  Engagement in Group:  Engaged  Modes of Intervention:  Problem-solving  Summary of Progress/Problems: Pt attended self inventory group, and engaged in treatment. Pt reported that he was homeless and just wanted some assistance with a place to live after discharge.   Jacquelyne Balint Shanta 11/15/2012, 12:32 PM

## 2012-11-15 NOTE — Progress Notes (Signed)
Pt remains a high fall risk and has an unsteady gait due to a past history of a stroke   He uses a walker and frequently needs assistance to get up and down   Pt on 1:1 and is safe at present

## 2012-11-15 NOTE — BHH Group Notes (Signed)
B[facility] Group Notes: (Clinical Social Work)   11/15/2012      Type of Therapy:  Group Therapy   Participation Level:  Did Not Attend    Ambrose Mantle, LCSW 11/15/2012, 11:14 AM

## 2012-11-15 NOTE — Progress Notes (Signed)
Adult Psychoeducational Group Note  Date:  11/15/2012 Time:  3:52 PM  Group Topic/Focus:  Healthy Communication:   The focus of this group is to discuss communication, barriers to communication, as well as healthy ways to communicate with others.  Participation Level:  Did Not Attend   [name] [name] 11/15/2012, 3:52 PM

## 2012-11-16 MED ORDER — NICOTINE POLACRILEX 2 MG MT GUM
2.0000 mg | CHEWING_GUM | OROMUCOSAL | Status: DC | PRN
  Administered 2012-11-17 – 2012-11-19 (×3): 2 mg via ORAL

## 2012-11-16 NOTE — Progress Notes (Signed)
1-1 note. D. Pt in room and eating dinner that was brought for him. Pt had been active in milieu this afternoon and visible and participating in various activities. Pt did have minor fall this afternoon but as of this time still has no complaints of pain and is in no acute distress. A. 1-1 continued at this time, support and encouragement provided. R. Safety maintained at this time, will continue to monitor.

## 2012-11-16 NOTE — Progress Notes (Signed)
Patient ID: [name] [name], male   DOB: [birth year], 62 y.o.   MRN: [id number] D: patient continues with 1:1 observation due to high fall risk.  Patient is pleasant and cooperative with staff.  He denies any SI/HI/AVH.  He rates his depression as a 7; hopelessness as a 10.  Patient's main concerns are living arrangements upon discharge and money for medications.  Patient continues to c/o cravings. A: continue to monitor MD orders and medication management.  Continue 1:1 for safety concern. R: Patient's behavior has been appropriate.

## 2012-11-16 NOTE — [facility] Group Notes (Signed)
[facility] Group Notes:  (Clinical Social Work)  11/16/2012  10:00-11:00AM  Summary of Progress/Problems:   The main focus of today's process group was to   identify the patient's current support system and decide on other supports that can be put in place.  The picture on workbook was used to discuss why additional supports are needed, and a hand-out was distributed with four definitions/levels of support, then used to talk about how patients have given and received all different kinds of support.  An emphasis was placed on using counselor, doctor, therapy groups, 12-step groups, and problem-specific support groups to expand supports.  The patient attended the first half of group then left.  He listened while present, but did not contribute to the discussion.  Type of Therapy:  Process Group with Motivational Interviewing  Participation Level:  Minimal  Participation Quality:  Attentive  Affect:  Blunted and Depressed  Cognitive:  Oriented  Insight:  Limited  Engagement in Therapy:  Limited  Modes of Intervention:   Education, Support and Processing, Activity  Ambrose Mantle, LCSW 11/16/2012, 12:58 PM

## 2012-11-16 NOTE — Progress Notes (Signed)
Patient ID: [name] [name], male   DOB: Aug 07, 1950, 62 y.o.   MRN: [id number] D)  Brighter this evening, attended group,  Feeling positive about being able to finally get off the streets and is going to be able to receive retirement benefit money after receiving help from people at church to get the paperwork done.  Has been pleasant, conversational, no c/o's voiced.  Walked in the hall using his rolling walker, gait unsteady, rt sided weakness d/t hx CVA. A)  Remains on 1:1 observation for safety with MHT at bedside,  Continue POC R)  Safety maintained.

## 2012-11-16 NOTE — Progress Notes (Signed)
[facility] MD Progress Note  11/16/2012 9:56 AM [name] [name]  MRN:  [id number] Subjective:  [name] [name] reports that he is doing well overall. He is still waiting for someone to come talk to him about possible residential placement. He reports that he is depressed, but that is only because he doesn't have a place to live. He denies any thoughts of suicide or homicide. He denies any auditory or visual hallucinations. He endorses good sleep, and he is eating well. He denies any withdrawal symptoms or desire to drink alcohol.  Diagnosis:  DSM5:  Schizophrenia Disorders:  Obsessive-Compulsive Disorders:  Trauma-Stressor Disorders:  Substance/Addictive Disorders: Alcohol Related Disorder - Severe (303.90)  Depressive Disorders: Major Depressive Disorder - Moderate (296.22)   Axis I: Alcohol Abuse, Major Depression, single episode and Substance Induced Mood Disorder  Axis II: Deferred  Axis III:  Past Medical History   Diagnosis  Date   .  Hypercholesteremia    .  Stroke    .  Hypertension    .  Alcohol abuse    .  Seizures    .  Depression    .  Homelessness      ADL's:  Impaired  Sleep: Good  Appetite:  Good  Suicidal Ideation:  Patient denies any thought, plan, or intent Homicidal Ideation:  Patient denies any thought, plan, or intent AEB (as evidenced by):  Psychiatric Specialty Exam: Review of Systems  Constitutional: Negative.   HENT: Negative.   Eyes: Negative.   Respiratory: Negative.   Cardiovascular: Negative.   Gastrointestinal: Negative.   Genitourinary: Negative.   Musculoskeletal: Negative.   Skin: Negative.   Neurological: Negative.   Endo/Heme/Allergies: Negative.   Psychiatric/Behavioral: Negative.     Blood pressure 135/91, pulse 90, temperature 98 F (36.7 C), temperature source Oral, resp. rate 20, height 5\' 5"  (1.651 m), weight 68.947 kg (152 lb), SpO2 97.00%.Body mass index is 25.29 kg/(m^2).  General Appearance: Disheveled  Eye Contact::  Good   Speech:  Clear and Coherent  Volume:  Normal  Mood:  Anxious  Affect:  Congruent  Thought Process:  Circumstantial and Loose  Orientation:  Full (Time, Place, and Person)  Thought Content:  WDL  Suicidal Thoughts:  No  Homicidal Thoughts:  No  Memory:  Immediate;   Good Recent;   Good Remote;   Good  Judgement:  Fair  Insight:  Lacking  Psychomotor Activity:  Normal  Concentration:  Good  Recall:  Good  Akathisia:  No  Handed:  Right  AIMS (if indicated):     Assets:  Communication Skills Desire for Improvement  Sleep:  Number of Hours: 5.25   Current Medications: Current Facility-Administered Medications  Medication Dose Route Frequency Provider Last Rate Last Dose  . acetaminophen (TYLENOL) tablet 650 mg  650 mg Oral Q6H PRN Kerry Hough, PA-C      . alum & mag hydroxide-simeth (MAALOX/MYLANTA) 200-200-20 MG/5ML suspension 30 mL  30 mL Oral Q4H PRN Kerry Hough, PA-C      . citalopram (CELEXA) tablet 10 mg  10 mg Oral Q2000 Sanjuana Kava, NP   10 mg at 11/15/12 2002  . lisinopril (PRINIVIL,ZESTRIL) tablet 10 mg  10 mg Oral Daily Sanjuana Kava, NP   10 mg at 11/16/12 0836  . magnesium hydroxide (MILK OF MAGNESIA) suspension 30 mL  30 mL Oral Daily PRN Kerry Hough, PA-C      . multivitamin with minerals tablet 1 tablet  1 tablet Oral Daily Kerry Hough,  PA-C   1 tablet at 11/16/12 0836  . nicotine polacrilex (NICORETTE) gum 2 mg  2 mg Oral PRN Rachael Fee, MD      . thiamine (B-1) injection 100 mg  100 mg Intramuscular Once Kerry Hough, PA-C      . thiamine (VITAMIN B-1) tablet 100 mg  100 mg Oral Daily Kerry Hough, PA-C   100 mg at 11/16/12 0836  . traZODone (DESYREL) tablet 50 mg  50 mg Oral QHS,MR X 1 Kerry Hough, PA-C   50 mg at 11/15/12 2242    Lab Results: No results found for this or any previous visit (from the past 48 hour(s)).  Physical Findings: AIMS: Facial and Oral Movements Muscles of Facial Expression: None, normal Lips and  Perioral Area: None, normal Jaw: None, normal Tongue: None, normal,Extremity Movements Upper (arms, wrists, hands, fingers): None, normal Lower (legs, knees, ankles, toes): None, normal, Trunk Movements Neck, shoulders, hips: None, normal, Overall Severity Severity of abnormal movements (highest score from questions above): None, normal Incapacitation due to abnormal movements: None, normal Patient's awareness of abnormal movements (rate only patient's report): No Awareness, Dental Status Current problems with teeth and/or dentures?: No Does patient usually wear dentures?: No  CIWA:  CIWA-Ar Total: 1 COWS:     Treatment Plan Summary: Daily contact with patient to assess and evaluate symptoms and progress in treatment Medication management  Plan: We'll continue his current plan of care and research options for possible residential placement.  Medical Decision Making Problem Points:  Established problem, stable/improving (1) and Review of psycho-social stressors (1) Data Points:  Review or order clinical lab tests (1) Review of medication regiment & side effects (2)  I certify that inpatient services furnished can reasonably be expected to improve the patient's condition.   Lenon Kuennen 11/16/2012, 9:56 AM

## 2012-11-16 NOTE — Progress Notes (Signed)
Pt had a fall this afternoon in tubroom. Pt and mht reported that as pt was sitting in walker and putting on socks the walker slid out from underneath him and he slid to the floor on his butt. Pt stated that he was ok and was not hurt. Jorje Guild, PA informed of event. Will continue to monitor.

## 2012-11-16 NOTE — [facility] Group Notes (Signed)
[facility] Group Notes:  (Nursing/MHT/Case Management/Adjunct)  Date:  11/16/2012  Time:  10:28 AM  Type of Therapy: Psychoeducational Skills  Participation Level: Active  Participation Quality: Appropriate  Affect: Appropriate  Cognitive: Appropriate  Insight: Appropriate  Engagement in Group: Engaged  Modes of Intervention: Problem-solving  Summary of Progress/Problems: Pt attended self inventory group, and engaged in treatment.  Jacquelyne Balint Shanta 11/16/2012, 10:28 AM

## 2012-11-16 NOTE — Progress Notes (Signed)
1-1 note. D. Pt in bed at this time, getting ready for bed. Pt states that he is ok, he is in no pain and has requested that his medications be brought to his room since he was already settled in bed. Pt had been up and visible this evening and participating in various milieu activities. A. Pt received medications as requested and support and encouragement provided. R. Safety maintained at this time, will continue to monitor.

## 2012-11-16 NOTE — Progress Notes (Signed)
1-1 note - D. Pt in tubroom attending to personal hygiene at this time. Pt has been up this morning, did attend group session earlier and verbalized that he was upset because he thought that the group was monopolized between a few individuals. Pt did receive lunch in his room this afternoon and did speak about how he was living at the [facility] but that he is currently homeless and has no place to go at discharge and is concerned about this. Pt does report that he is building his strength up but that he still needs assistance by using the walker. Pt has not verbalized any complaints of pain. A. 1-1 continued at this time, support and encouragement provided. R. Safety maintained at this time, will continue to monitor.

## 2012-11-16 NOTE — Progress Notes (Signed)
[facility] Group Notes:  (Nursing/MHT/Case Management/Adjunct)  Date:  11/15/2012  Time:  2100  Type of Therapy:  wrap up group  Participation Level:  Active  Participation Quality:  Appropriate, Attentive, Sharing and Supportive  Affect:  Appropriate  Cognitive:  Appropriate  Insight:  Appropriate  Engagement in Group:  Engaged  Modes of Intervention:  Clarification, Education and Support  Summary of Progress/Problems: Pt reported having lived on the streets for eight years. Pt is expecting to receive SS retirement in October and is in planning mode as to where to live.  Pt reports dull day but hopeful for future.    [name] [name] 11/16/2012, 3:24 AM

## 2012-11-17 DIAGNOSIS — F1994 Other psychoactive substance use, unspecified with psychoactive substance-induced mood disorder: Secondary | ICD-10-CM

## 2012-11-17 NOTE — Progress Notes (Signed)
Patient ID: [name] [name], male   DOB: [birth year], 62 y.o.   MRN: [id number] 1:1 Note: Patient pleasant and cooperative on assessment, denies SI/HI. R: 1:1 observation, encourage staff/peer interaction and group participation. Administer medications as ordered by MD. R: Pt remains cooperative with care.

## 2012-11-17 NOTE — Progress Notes (Signed)
1:1 Observation nursing note:  D: Patient observed in med room, up to window to receive nicorette gum.  Patient using rolling walker and up to window without a problem.  No further complaints at this time. A: 1:1 observation continued for patient safety. R:  Safety maintained on unit.

## 2012-11-17 NOTE — Progress Notes (Signed)
1:1 Nursing Note: pt resting in bed with eyes closed. Breathing even and unlabored. 1:1 monitoring continued for safety. Pt remains safe on the unit.

## 2012-11-17 NOTE — Tx Team (Signed)
Interdisciplinary Treatment Plan Update (Adult)  Date: 11/17/2012   Time Reviewed: 11:47 AM  Progress in Treatment:  Attending groups: Intermittently  Participating in groups: Minimally  Taking medication as prescribed: Yes  Tolerating medication: Yes  Family/Significant othe contact made: No. SPE not required for this pt as he did not endorse SI during admission or during stay.   Patient understands diagnosis: Yes, AEB seeking treatment for substance abuse and depression.  Discussing patient identified problems/goals with staff: Yes  Medical problems stabilized or resolved: Yes  Denies suicidal/homicidal ideation: Yes, in group/self report.  Patient has not harmed self or Others: Yes  New problem(s) identified: Pt high fall risk and is on 1:1. Discharge Plan or Barriers: Pt referral sent to [facility]. [facility] currently reviewing pt information. Plan B: pt will followup at [facility] for med management and will d/c to shelter.  Additional comments: n/a  Reason for Continuation of Hospitalization: Medication management Mood stabilization Estimated length of stay: 1-2 days  For review of initial/current patient goals, please see plan of care.  Attendees:  Patient:    Family:    Physician: Geoffery Lyons MD 11/17/2012 11:47 AM   Nursing: Philippa Chester RN  11/17/2012 11:47 AM   Clinical Social Worker Marielle Mantione Smart, LCSWA  11/17/2012 11:47 AM   Other: Aggie PA  11/17/2012 11:47 AM   Other: Darden Dates Nurse CM 11/17/2012 11:47 AM   Other:    Other:    Scribe for Treatment Team:  [employer] LCSWA 11/17/2012 11:47 AM

## 2012-11-17 NOTE — Progress Notes (Signed)
Patient ID: [name] [name], male   DOB: 11-18-1950, 62 y.o.   MRN: [id number] [facility] MD Progress Note  11/17/2012 2:11 PM [name] [name]  MRN:  [id number]  Subjective:  [name] [name] is voicing that he is doing fine, just tired. He says that he has not actually given it a thought today about how his mood is feeling. He adds that his problem is what he has had for years, homelessness. He concluded by saying, "You ought to find me a place to live, or buy me sometime to stay here till 01/07/13. This is the date that my social security will kick in".  OThayer [name] is lying in his bed, snoring. Easily aroused. Alert and oriented x 3. Speech clear. Remains on 1:1 supervision for safety.  Diagnosis:   DSM5: Schizophrenia Disorders:   Obsessive-Compulsive Disorders:   Trauma-Stressor Disorders:   Substance/Addictive Disorders:  Alcohol Related Disorder - Severe (303.90) Depressive Disorders:  Major Depressive Disorder - Moderate (296.22)  Axis I: Alcohol Abuse, Major Depression, single episode and Substance Induced Mood Disorder Axis II: Deferred Axis III:  Past Medical History  Diagnosis Date  . Hypercholesteremia   . Stroke   . Hypertension   . Alcohol abuse   . Seizures   . Depression   . Homelessness     ADL's:  Intact  Sleep: Good  Appetite:  Good  Suicidal Ideation:  Patient denies any thought, plan, or intent Homicidal Ideation:  Patient denies any thought, plan, or intent  AEB (as evidenced by):  Psychiatric Specialty Exam: Review of Systems  Constitutional: Negative.   HENT: Negative.   Eyes: Negative.   Respiratory: Negative.   Cardiovascular: Negative.   Gastrointestinal: Negative.   Genitourinary: Negative.   Musculoskeletal: Negative.   Skin: Negative.   Neurological: Negative.   Endo/Heme/Allergies: Negative.   Psychiatric/Behavioral: Negative.     Blood pressure 128/84, pulse 96, temperature 97.6 F (36.4 C), temperature source Oral, resp. rate 16, height 5'  5" (1.651 m), weight 68.947 kg (152 lb), SpO2 97.00%.Body mass index is 25.29 kg/(m^2).  General Appearance: Casual  Eye Contact::  Good  Speech:  Clear and Coherent  Volume:  Normal  Mood:  Dysphoric  Affect:  Congruent  Thought Process:  Linear and Tangential  Orientation:  Full (Time, Place, and Person)  Thought Content:  WDL  Suicidal Thoughts:  No  Homicidal Thoughts:  No  Memory:  Immediate;   Fair Recent;   Fair Remote;   Poor  Judgement:  Fair  Insight:  Fair  Psychomotor Activity:  Normal  Concentration:  Good  Recall:  Good  Akathisia:  No  Handed:  Right  AIMS (if indicated):     Assets:  Communication Skills Desire for Improvement  Sleep:  Number of Hours: 6.25   Current Medications: Current Facility-Administered Medications  Medication Dose Route Frequency Provider Last Rate Last Dose  . acetaminophen (TYLENOL) tablet 650 mg  650 mg Oral Q6H PRN Kerry Hough, PA-C      . alum & mag hydroxide-simeth (MAALOX/MYLANTA) 200-200-20 MG/5ML suspension 30 mL  30 mL Oral Q4H PRN Kerry Hough, PA-C      . citalopram (CELEXA) tablet 10 mg  10 mg Oral Q2000 Sanjuana Kava, NP   10 mg at 11/16/12 2156  . lisinopril (PRINIVIL,ZESTRIL) tablet 10 mg  10 mg Oral Daily Sanjuana Kava, NP   10 mg at 11/17/12 0811  . magnesium hydroxide (MILK OF MAGNESIA) suspension 30 mL  30 mL Oral Daily PRN  Kerry Hough, PA-C      . multivitamin with minerals tablet 1 tablet  1 tablet Oral Daily Kerry Hough, PA-C   1 tablet at 11/17/12 1610  . nicotine polacrilex (NICORETTE) gum 2 mg  2 mg Oral PRN Rachael Fee, MD      . thiamine (B-1) injection 100 mg  100 mg Intramuscular Once Kerry Hough, PA-C      . thiamine (VITAMIN B-1) tablet 100 mg  100 mg Oral Daily Kerry Hough, PA-C   100 mg at 11/17/12 9604  . traZODone (DESYREL) tablet 50 mg  50 mg Oral QHS,MR X 1 Kerry Hough, PA-C   50 mg at 11/16/12 2156    Lab Results: No results found for this or any previous visit (from  the past 48 hour(s)).  Physical Findings: AIMS: Facial and Oral Movements Muscles of Facial Expression: None, normal Lips and Perioral Area: None, normal Jaw: None, normal Tongue: None, normal,Extremity Movements Upper (arms, wrists, hands, fingers): None, normal Lower (legs, knees, ankles, toes): None, normal, Trunk Movements Neck, shoulders, hips: None, normal, Overall Severity Severity of abnormal movements (highest score from questions above): None, normal Incapacitation due to abnormal movements: None, normal Patient's awareness of abnormal movements (rate only patient's report): No Awareness, Dental Status Current problems with teeth and/or dentures?: No Does patient usually wear dentures?: No  CIWA:  CIWA-Ar Total: 0 COWS:     Treatment Plan Summary: Daily contact with patient to assess and evaluate symptoms and progress in treatment Medication management Research residential options  Plan: Supportive approach/coping skills/relapse prevention. Encouraged out of room, participation in group sessions and application of coping skills when distressed. Will continue to monitor response to/adverse effects of medications in use to assure effectiveness. Continue to monitor mood, behavior and interaction with staff and other patients, 1:1 supervision for safety. Optimize discharge planning Continue current plan of care.  Medical Decision Making Problem Points:  Established problem, stable/improving (1), Review of last therapy session (1) and Review of psycho-social stressors (1) Data Points:  Review or order clinical lab tests (1) Review of medication regiment & side effects (2)  I certify that inpatient services furnished can reasonably be expected to improve the patient's condition.   Armandina Stammer I, PMHNP-BC 11/17/2012, 2:11 PM

## 2012-11-17 NOTE — Progress Notes (Signed)
Patient did attend the evening speaker AA meeting.  

## 2012-11-17 NOTE — Progress Notes (Signed)
Adult Psychoeducational Group Note  Date:  11/17/2012 Time:  4:37 PM  Group Topic/Focus:  Dimensions of Wellness:   The focus of this group is to introduce the topic of wellness and discuss the role each dimension of wellness plays in total health.  Participation Level:  Minimal  Participation Quality:  Appropriate and Attentive  Affect:  Appropriate  Cognitive:  Alert and Oriented  Insight: Lacking and Limited  Engagement in Group:  Lacking and Limited  Modes of Intervention:  Discussion, Education, Exploration and Support  Additional Comments:  Pt continues to talk about needing a place to live.  Reynolds Bowl 11/17/2012, 4:37 PM

## 2012-11-17 NOTE — Progress Notes (Signed)
1:1 Nursing Note: pt resting in bed with eyes closed. Breathing even and unlabored. 1:1 monitoring continued for safety. Pt remains safe on the unit.  

## 2012-11-18 DIAGNOSIS — F411 Generalized anxiety disorder: Secondary | ICD-10-CM

## 2012-11-18 NOTE — Progress Notes (Signed)
Adult Psychoeducational Group Note  Date:  11/18/2012 Time:  2:10 PM  Group Topic/Focus:  Recovery Goals:   The focus of this group is to identify appropriate goals for recovery and establish a plan to achieve them.  Participation Level:  Active  Participation Quality:  Appropriate and Attentive  Affect:  Appropriate  Cognitive:  Alert and Appropriate  Insight: Good  Engagement in Group:  Engaged  Modes of Intervention:  Discussion, Socialization and Support  Additional Comments:  Pt came to group and shared that being homeless and medication mismanagement were standing between him and recovery. Pt plans on changing this by getting into a long-term program and seeing the doctor and therapist [facility] sets him up with.  Cathlean Cower 11/18/2012, 2:10 PM

## 2012-11-18 NOTE — Progress Notes (Signed)
Patient did not attend 0900 orientation group. 

## 2012-11-18 NOTE — Clinical Social Work Note (Signed)
CSW spoke with admissions supervisor. Updated progress note and meds sent to [facility] for further review.

## 2012-11-18 NOTE — Progress Notes (Signed)
Patient ID: [name] [name], male   DOB: 08-Nov-1950, 62 y.o.   MRN: [id number] 1:1 Note: D: Pt remains asleep; no distress noted. A: sitter R: Pt with no needs, remains safe.

## 2012-11-18 NOTE — Progress Notes (Signed)
RN 1:1 NOTE D: Patient denies SI/HI and A/V hallucinations; patient reports sleep is well; reports appetite is good; reports energy level is normal ; reports ability to pay attention is good; rates depression as 7/10; rates hopelessness 10/10; patient reports diarrhea   A: Monitored q 15 minutes; patient encouraged to attend groups; patient educated about medications; patient given medications per physician orders; patient encouraged to express feelings and/or concerns  R: Patient is appropriate to circumstance and stools are not watery and has only had 1 bowel movement ; patient's interaction with staff and peers is appropriate;  patient is taking medications as prescribed and tolerating medications; patient is attending some groups and engaging

## 2012-11-18 NOTE — [facility] Group Notes (Signed)
[facility] LCSW Group Therapy  11/18/2012 1:36 PM  Type of Therapy:  Group Therapy  Participation Level:  Did Not Attend  Smart, Bowden Boody 11/18/2012, 1:36 PM  

## 2012-11-18 NOTE — Progress Notes (Signed)
Adult Psychoeducational Group Note  Date:  11/18/2012 Time:  8:30 pm   Group Topic/Focus:  Wrap-Up Group:   The focus of this group is to help patients review their daily goal of treatment and discuss progress on daily workbooks.  Participation Level:  Active  Participation Quality:  Appropriate  Affect:  Flat  Cognitive:  Appropriate  Insight: Appropriate  Engagement in Group:  Engaged  Modes of Intervention:  Discussion, Education, Socialization and Support  Additional Comments:  Pt participated in the group activity. Pt stated why he was at the hospital and his goal is to find housing and stop drinking. Pt stated that he was honest and helpful to others.   Mckay Brandt 11/18/2012, 11:02 PM

## 2012-11-18 NOTE — Progress Notes (Signed)
Patient ID: [name] [name], male   DOB: February 15, 1951, 62 y.o.   MRN: [id number] 1:1 Note: D: Pt asleep, no s/s of distress noted. A: Sitter 1:1 R: No change in status.

## 2012-11-18 NOTE — Progress Notes (Signed)
RN 1:1 NOTE  Patient is quietly resting in the bed; respirations are even and unlabored; patient has no acute needs at this time; will continue to monitor

## 2012-11-18 NOTE — Progress Notes (Signed)
[facility] MD Progress Note  11/18/2012 5:52 PM Jami Ohlin  MRN:  [id number] Subjective:  Still no clear idea where he is going from here. States that he cant go back to the [facility]. He is going to be able to access his Social Security in October. Meanwhile not sure where he is going to go. Concerned about the possibility of falling  Diagnosis:   DSM5: Schizophrenia Disorders:   Obsessive-Compulsive Disorders:   Trauma-Stressor Disorders:   Substance/Addictive Disorders:  Alcohol Related Disorder - Severe (303.90) Depressive Disorders:    Axis I: Depressive Disorder NOS  ADL's:  Impaired  Sleep: Fair  Appetite:  Fair  Suicidal Ideation:  Plan:  denies Intent:  denies Means:  denies Homicidal Ideation:  Plan:  denies Intent:  denies Means:  denies AEB (as evidenced by):  Psychiatric Specialty Exam: Review of Systems  Constitutional: Negative.   HENT: Negative.   Eyes: Negative.   Respiratory: Negative.   Cardiovascular: Negative.   Gastrointestinal: Negative.   Genitourinary: Negative.   Musculoskeletal: Positive for back pain and joint pain.       Leg pain, weakness  Skin: Negative.   Neurological: Positive for dizziness.  Endo/Heme/Allergies: Negative.   Psychiatric/Behavioral: Positive for substance abuse. The patient is nervous/anxious.     Blood pressure 120/85, pulse 84, temperature 97.7 F (36.5 C), temperature source Oral, resp. rate 16, height 5\' 5"  (1.651 m), weight 68.947 kg (152 lb), SpO2 97.00%.Body mass index is 25.29 kg/(m^2).  General Appearance: Disheveled  Eye Solicitor::  Fair  Speech:  Clear and Coherent  Volume:  Normal  Mood:  Anxious and worried  Affect:  anxious  Thought Process:  Coherent and Goal Directed  Orientation:  Full (Time, Place, and Person)  Thought Content:  worries, concerns  Suicidal Thoughts:  No  Homicidal Thoughts:  No  Memory:  Immediate;   Fair Recent;   Fair Remote;   Fair  Judgement:  Fair  Insight:  Present   Psychomotor Activity:  Restlessness  Concentration:  Fair  Recall:  Fair  Akathisia:  No  Handed:  Right  AIMS (if indicated):     Assets:  Desire for Improvement  Sleep:  Number of Hours: 5.25   Current Medications: Current Facility-Administered Medications  Medication Dose Route Frequency Provider Last Rate Last Dose  . acetaminophen (TYLENOL) tablet 650 mg  650 mg Oral Q6H PRN Kerry Hough, PA-C      . alum & mag hydroxide-simeth (MAALOX/MYLANTA) 200-200-20 MG/5ML suspension 30 mL  30 mL Oral Q4H PRN Kerry Hough, PA-C      . citalopram (CELEXA) tablet 10 mg  10 mg Oral Q2000 Sanjuana Kava, NP   10 mg at 11/17/12 2209  . lisinopril (PRINIVIL,ZESTRIL) tablet 10 mg  10 mg Oral Daily Sanjuana Kava, NP   10 mg at 11/18/12 0803  . magnesium hydroxide (MILK OF MAGNESIA) suspension 30 mL  30 mL Oral Daily PRN Kerry Hough, PA-C      . multivitamin with minerals tablet 1 tablet  1 tablet Oral Daily Kerry Hough, PA-C   1 tablet at 11/18/12 0803  . nicotine polacrilex (NICORETTE) gum 2 mg  2 mg Oral PRN Rachael Fee, MD   2 mg at 11/17/12 1425  . thiamine (B-1) injection 100 mg  100 mg Intramuscular Once [employer], PA-C      . thiamine (VITAMIN B-1) tablet 100 mg  100 mg Oral Daily Kerry Hough, PA-C   100  mg at 11/18/12 0803  . traZODone (DESYREL) tablet 50 mg  50 mg Oral QHS,MR X 1 Kerry Hough, PA-C   50 mg at 11/17/12 2209    Lab Results: No results found for this or any previous visit (from the past 48 hour(s)).  Physical Findings: AIMS: Facial and Oral Movements Muscles of Facial Expression: None, normal Lips and Perioral Area: None, normal Jaw: None, normal Tongue: None, normal,Extremity Movements Upper (arms, wrists, hands, fingers): None, normal Lower (legs, knees, ankles, toes): None, normal, Trunk Movements Neck, shoulders, hips: None, normal, Overall Severity Severity of abnormal movements (highest score from questions above): None,  normal Incapacitation due to abnormal movements: None, normal Patient's awareness of abnormal movements (rate only patient's report): No Awareness, Dental Status Current problems with teeth and/or dentures?: No Does patient usually wear dentures?: No  CIWA:  CIWA-Ar Total: 0 COWS:     Treatment Plan Summary: Daily contact with patient to assess and evaluate symptoms and progress in treatment Medication management  Plan: Supportive approach/coping skills/relapse prevention           Ask PT to reassess Medical Decision Making Problem Points:  Review of psycho-social stressors (1) Data Points:  Review of medication regiment & side effects (2)  I certify that inpatient services furnished can reasonably be expected to improve the patient's condition.   Zailah Zagami A 11/18/2012, 5:52 PM

## 2012-11-18 NOTE — Clinical Social Work Note (Signed)
CSW left message with admissions supervisor at [facility] to check on status of referral for [facility]. 8:45AM

## 2012-11-18 NOTE — Clinical Social Work Note (Signed)
Admissions supervisor contacted CSW. Pt denied admission into [facility]. Pt interested in [facility] if he meets criteria (medical needs). CSW to contact [facility] in order to ask about pt eligibility and get admission date. Pt to follow up at [facility] for med management.

## 2012-11-19 DIAGNOSIS — F329 Major depressive disorder, single episode, unspecified: Secondary | ICD-10-CM

## 2012-11-19 NOTE — [facility] Group Notes (Signed)
[facility] LCSW Aftercare Discharge Planning Group Note   11/19/2012 9:11 AM  Participation Quality:  Appropriate  Mood/Affect:  Appropriate  Depression Rating:  2  Anxiety Rating:  2  Thoughts of Suicide:  No Will you contract for safety?   NA  Current AVH:  No  Plan for Discharge/Comments:  CSW informed pt that he had been accepted into [facility] on Sept. 9th. Pt will followup at [facility] for med management. Pt was not accepted into [facility]. Pt asked about his walker and stated that someone should be coming to fix the breaks at some point today. Pt reported stomach problems and poor sleep last night.   Transportation Means: bus?  Supports: none identified. [facility], Herbert Seta

## 2012-11-19 NOTE — [facility] Group Notes (Signed)
[facility] LCSW Group Therapy  11/19/2012 2:54 PM  Type of Therapy:  Group Therapy  Participation Level:  Active  Participation Quality:  Attentive  Affect:  Appropriate  Cognitive:  Appropriate  Insight:  Improving  Engagement in Therapy:  Engaged  Modes of Intervention:  Discussion, Education, Exploration, Socialization and Support  Summary of Progress/Problems: Emotion Regulation: This group focused on both positive and negative emotion identification and allowed group members to process ways to identify feelings, regulate negative emotions, and find healthy ways to manage internal/external emotions. Group members were asked to reflect on a time when their reaction to an emotion led to a negative outcome and explored how alternative responses using emotion regulation would have benefited them. Group members were also asked to discuss a time when emotion regulation was utilized when a negative emotion was experienced. [name] stated that to him, emotion regulation meant learning how to control bad thoughts. He stated that currently, he feels content and that he noticed as he grew older, he has "mellowed out significantly." CSW asked pt to think of the last time that he responded negatively to a negative emotion. [name] talked about when his mother had been "murdered by staff at a facility." "they did not take proper care of her and I felt like driving my car into the building after she died." CSW asked [name] to explore what happened to prevent him from doing this. "Well I allowed myself to calm down and realized I was being irrational." Pt explored how he "tricked" his mind into calming down and avoiding impulsive behaviors. [name] shows improved insight and progress in the group setting AEB his ability to critically think about how he is able to manage negative emotions such as anger, jealousy, and depression. He was able to acknowledge that drinking mouthwash was a negative response to his depressed mood  and chronic homelessness.    Smart, Shaundrea Carrigg 11/19/2012, 2:54 PM

## 2012-11-19 NOTE — Progress Notes (Signed)
D: Patient denies SI/HI and A/V hallucinations; patient reports sleep is poor; reports appetite is good; reports energy level is normal ; reports ability to pay attention is good; rates depression as 7/10; rates hopelessness 10/10; patient is concerned about placement status upon discharge;   A: Monitored q 15 minutes; patient encouraged to attend groups; patient educated about medications; patient given medications per physician orders; patient encouraged to express feelings and/or concerns  R: Patient is appropriate and animated but at times the patient can be irritable ;patient reports no dizziness at this time; patient has had no episodes of diarrhea; patient's interaction with staff and peers is appropriate; patient was able to set goal to talk with staff 1:1 when having feelings of SI; patient is taking medications as prescribed and tolerating medications; patient is attending some groups and engaging

## 2012-11-19 NOTE — Progress Notes (Signed)
[facility] MD Progress Note  11/19/2012 3:04 PM [name] [name]  MRN:  [id number] Subjective:  He is still having a hard time. He endorses he still feel very anxious, worried about when he gets out of here. He is relieved to have a date to get into [facility] next Tuesday. He is concerned about what is going to happen from now till then Diagnosis:   DSM5: Schizophrenia Disorders:   Obsessive-Compulsive Disorders:   Trauma-Stressor Disorders:   Substance/Addictive Disorders:  Alcohol Related Disorder - Moderate (303.90) Depressive Disorders:  Major Depressive Disorder - Moderate (296.22)  Axis I: Anxiety Disorder NOS  ADL's:  Intact  Sleep: Fair  Appetite:  Fair  Suicidal Ideation:  Plan:  denies Intent:  denies Means:  denies Homicidal Ideation:  Plan:  denies Intent:  denies Means:  denies AEB (as evidenced by):  Psychiatric Specialty Exam: Review of Systems  Constitutional: Negative.   HENT: Negative.   Eyes: Negative.   Respiratory: Negative.   Cardiovascular: Negative.   Gastrointestinal: Negative.   Genitourinary: Negative.   Musculoskeletal: Positive for myalgias, back pain and joint pain.       Leg pain  Skin: Negative.   Neurological: Positive for dizziness.  Endo/Heme/Allergies: Negative.   Psychiatric/Behavioral: Positive for substance abuse. The patient is nervous/anxious.     Blood pressure 158/101, pulse 92, temperature 97.3 F (36.3 C), temperature source Oral, resp. rate 16, height 5\' 5"  (1.651 m), weight 68.947 kg (152 lb), SpO2 97.00%.Body mass index is 25.29 kg/(m^2).  General Appearance: Fairly Groomed  Patent attorney::  Fair  Speech:  Clear and Coherent  Volume:  Normal  Mood:  Anxious and worried  Affect:  anxious, worried  Thought Process:  Coherent and Goal Directed  Orientation:  Full (Time, Place, and Person)  Thought Content:  worries, concerns  Suicidal Thoughts:  No  Homicidal Thoughts:  No  Memory:  Immediate;   Fair Recent;    Fair Remote;   Fair  Judgement:  Fair  Insight:  Present, superficial  Psychomotor Activity:  Restlessness  Concentration:  Fair  Recall:  Fair  Akathisia:  No  Handed:  Right  AIMS (if indicated):     Assets:  Desire for Improvement  Sleep:  Number of Hours: 4   Current Medications: Current Facility-Administered Medications  Medication Dose Route Frequency Provider Last Rate Last Dose  . acetaminophen (TYLENOL) tablet 650 mg  650 mg Oral Q6H PRN Kerry Hough, PA-C      . alum & mag hydroxide-simeth (MAALOX/MYLANTA) 200-200-20 MG/5ML suspension 30 mL  30 mL Oral Q4H PRN Kerry Hough, PA-C      . citalopram (CELEXA) tablet 10 mg  10 mg Oral Q2000 Sanjuana Kava, NP   10 mg at 11/18/12 2120  . lisinopril (PRINIVIL,ZESTRIL) tablet 10 mg  10 mg Oral Daily Sanjuana Kava, NP   10 mg at 11/19/12 0803  . magnesium hydroxide (MILK OF MAGNESIA) suspension 30 mL  30 mL Oral Daily PRN Kerry Hough, PA-C      . multivitamin with minerals tablet 1 tablet  1 tablet Oral Daily Kerry Hough, PA-C   1 tablet at 11/19/12 0803  . nicotine polacrilex (NICORETTE) gum 2 mg  2 mg Oral PRN Rachael Fee, MD   2 mg at 11/19/12 0825  . thiamine (B-1) injection 100 mg  100 mg Intramuscular Once [employer], PA-C      . thiamine (VITAMIN B-1) tablet 100 mg  100 mg Oral  Daily Kerry Hough, PA-C   100 mg at 11/19/12 4098  . traZODone (DESYREL) tablet 50 mg  50 mg Oral QHS,MR X 1 Kerry Hough, PA-C   50 mg at 11/18/12 2120    Lab Results: No results found for this or any previous visit (from the past 48 hour(s)).  Physical Findings: AIMS: Facial and Oral Movements Muscles of Facial Expression: None, normal Lips and Perioral Area: None, normal Jaw: None, normal Tongue: None, normal,Extremity Movements Upper (arms, wrists, hands, fingers): None, normal Lower (legs, knees, ankles, toes): None, normal, Trunk Movements Neck, shoulders, hips: None, normal, Overall Severity Severity of abnormal  movements (highest score from questions above): None, normal Incapacitation due to abnormal movements: None, normal Patient's awareness of abnormal movements (rate only patient's report): No Awareness, Dental Status Current problems with teeth and/or dentures?: No Does patient usually wear dentures?: No  CIWA:  CIWA-Ar Total: 0 COWS:     Treatment Plan Summary: Daily contact with patient to assess and evaluate symptoms and progress in treatment Medication management  Plan:Supporitve approach/coping skills/relapse prevention          F/U PT recommendations  Medical Decision Making Problem Points:  Review of psycho-social stressors (1) Data Points:  Review of medication regiment & side effects (2) Review of new medications or change in dosage (2)  I certify that inpatient services furnished can reasonably be expected to improve the patient's condition.   [name] [name] A 11/19/2012, 3:04 PM

## 2012-11-19 NOTE — Progress Notes (Signed)
Recreation Therapy Notes  Date: 09.02.2014 Time: 3:00pm Location: [address] Dayroom  Group Topic: Communication, Team Building, Problem Solving  Goal Area(s) Addresses:  Patient will effectively work with peer towards shared goal.  Patient will identify skill used to make activity successful.  Patient will identify how skills used during activity can be used to reach post d/c goals.   Behavioral Response: Did not attend.   Nakema Fake L Kindred Reidinger, LRT/CTRS  Zabdiel Dripps L 11/19/2012 8:08 AM 

## 2012-11-19 NOTE — Progress Notes (Signed)
D: Patient in the hallway on approach.  Patient states he is feeling good.  Patient states he can retire in October so he will have money coming in.  Patient states his only concern is he will be discharged and he has to wait 5 days until he can go to [facility].  Patient states he hopes he can go to a shelter in between.  Patient denies SI/HI and denies AVH. A: Staff to monitor Q 15 mins for safety.  Encouragement and support offered.  Scheduled medications administered per orders. R: Patient remains safe on the unit.  Patient attended group tonight.  Patient visible on the unit and interacting with peers.  Patient taking administered medications.

## 2012-11-19 NOTE — Progress Notes (Signed)
Patient ID: [name] [name], male   DOB: 05-Nov-1950, 62 y.o.   MRN: [id number]  D: Patient pleasant and cooperative with care. A: Monitor Q 15 minutes, encourage staff/peer interaction and group participation. Administer medications as ordered. R: Pt denies SI, was compliant with medications and group session.

## 2012-11-19 NOTE — BHH Group Notes (Signed)
Adult Psychoeducational Group Note  Date:  11/19/2012 Time:  10:53 PM  Group Topic/Focus:  Recovery Goals:   The focus of this group is to identify appropriate goals for recovery and establish a plan to achieve them.  Participation Level:  Active  Participation Quality:  Supportive  Affect:  Appropriate  Cognitive:  Appropriate  Insight: Appropriate  Engagement in Group:  Supportive  Modes of Intervention:  Education  Additional Comments:  Patient actively participated in group offering support to other patients.    Delia Chimes 11/19/2012, 10:53 PM

## 2012-11-19 NOTE — Evaluation (Signed)
Physical Therapy Evaluation Patient Details Name: [name] [name] MRN: [id number] DOB: May 07, 1950 Today's Date: 11/19/2012 Time: 7829-5621 PT Time Calculation (min): 25 min  PT Assessment / Plan / Recommendation History of Present Illness  62 year old Caucasian male. Admitted to [facility] from the [facility] with complaints of alcohol intoxication and withdrawal symptoms. 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   Clinical Impression  On eval, pt was supervision level assist for mobility-able to ambulate ~275 feet with rollator vs 1 handrail. No LOB during session. Pt reports that he had fall (while sitting on rollator seat) a few days ago due to brakes malfunctioning on rollator. Pt states MD has contacted someone to come and make adjustments. Instructed pt not to sit on rollator seat until brakes have been adjusted. Pt has hx of CVA and gait instability/abnormality is likely chronic-mostly coordination deficit (strength is at least 4/5 with MMT throughout LE). Pt appears aware of deficits. States plan is for d/c to [facility] next Tuesday.     PT Assessment  Patent does not need any further PT services    Follow Up Recommendations  No PT follow up    Does the patient have the potential to tolerate intense rehabilitation      Barriers to Discharge        Equipment Recommendations  None recommended by PT    Recommendations for Other Services     Frequency      Precautions / Restrictions Precautions Precautions: Fall Restrictions Weight Bearing Restrictions: No   Pertinent Vitals/Pain Denies pain      Mobility  Bed Mobility Bed Mobility: Not assessed Transfers Transfers: Sit to Stand;Stand to Sit Sit to Stand: 5: Supervision;From bed Stand to Sit: 5: Supervision;To bed Details for Transfer Assistance: VCS hand placement Ambulation/Gait Ambulation/Gait Assistance: 5: Supervision Ambulation Distance (Feet): 275 Feet Ambulation/Gait Assistance Details:  150' with rollator; 125 feet with 1 handrail. VCs distance from rollator and awareness of R LE/foot. Maintains step to gait pattern mostly-leads with good foot. Gait deficits appear to be more related to coordination instead of strength deficits.  Gait Pattern: Step-to pattern;Decreased stride length;Decreased dorsiflexion - right;Decreased hip/knee flexion - right    Exercises General Exercises - Lower Extremity Hip ABduction/ADduction: AROM;Both;10 reps;Standing Hip Flexion/Marching: AROM;Both;10 reps;Standing Heel Raises: AROM;Both;10 reps;Standing Other Exercises Other Exercises: Knee flexion, both, x 10 reps, standing (difficulty isolating hip/knee movement on R)   PT Diagnosis:    PT Problem List:   PT Treatment Interventions:       PT Goals(Current goals can be found in the care plan section) Acute Rehab PT Goals Patient Stated Goal: [facility] faciility until social security check comes in PT Goal Formulation: No goals set, d/c therapy  Visit Information  Last PT Received On: 11/19/12 Assistance Needed: +1 History of Present Illness: 62 year old Caucasian male. Admitted to [facility] from the [facility] with complaints of alcohol intoxication and withdrawal symptoms. 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        Prior Functioning  Home Living Family/patient expects to be discharged to:: Shelter/Homeless Living Arrangements: Alone Additional Comments: pt showers at [employer] or [employer], pt on food stamps Prior Function Level of Independence: Independent with assistive device(s) Comments: has used rollator for last 3-4 months Communication Communication: No difficulties    Cognition  Cognition Arousal/Alertness: Awake/alert Behavior During Therapy: WFL for tasks assessed/performed Overall Cognitive Status: Within Functional Limits for tasks assessed  Extremity/Trunk Assessment Upper Extremity Assessment Upper Extremity Assessment: Overall WFL  for tasks assessed Lower Extremity Assessment Lower Extremity Assessment: Generalized weakness;RLE deficits/detail RLE Deficits / Details: Residual deficits from previous CVA (mostly coordination). Strength at least 4/5 throughout. Pt able to DF and hold against resistance with MMT.    Balance Balance Balance Assessed: Yes Static Standing Balance Static Standing - Balance Support: No upper extremity supported Static Standing - Level of Assistance: 6: Modified independent (Device/Increase time) Static Standing - Comment/# of Minutes: Observed pt standing statically at telephone while making call for ~3-4 minutes. No LOB or difficulty Dynamic Standing Balance Dynamic Standing - Balance Support: Left upper extremity supported;Bilateral upper extremity supported Dynamic Standing - Level of Assistance: 5: Stand by assistance High Level Balance High Level Balance Activites: Backward walking;Direction changes;Turns;Side stepping High Level Balance Comments: Performed all tasks but pt requires at least 1 hand support. No LOB with tasks.   End of Session PT - End of Session Equipment Utilized During Treatment: Gait belt Activity Tolerance: Patient tolerated treatment well Patient left:  (ambulating in hallway with rollator)  GP Functional Assessment Tool Used: clinical judgement Functional Limitation: Mobility: Walking and moving around Mobility: Walking and Moving Around Current Status (Z6109): At least 1 percent but less than 20 percent impaired, limited or restricted Mobility: Walking and Moving Around Goal Status [phone]): At least 1 percent but less than 20 percent impaired, limited or restricted Mobility: Walking and Moving Around Discharge Status [phone]): At least 1 percent but less than 20 percent impaired, limited or restricted   Rebeca Alert, MPT Pager: [phone]

## 2012-11-19 NOTE — Progress Notes (Signed)
Adult Psychoeducational Group Note  Date:  11/19/2012 Time:  11:56 AM  Group Topic/Focus:  Personal Choices and Values:   The focus of this group is to help patients assess and explore the importance of values in their lives, how their values affect their decisions, how they express their values and what opposes their expression.  Participation Level:  Active  Participation Quality:  Appropriate and Sharing  Affect:  Appropriate  Cognitive:  Alert, Appropriate and Oriented  Insight: Appropriate, Good and Improving  Engagement in Group:  Engaged, Improving and Supportive  Modes of Intervention:  Discussion, Education, Orientation and Support  Additional Comments:  Pt shared that an important value for him is being treated fairly. He also gave good feedback to a younger pt about trying to finish school which would help her make a life for herself. He rambled some but was easily redirected.  Reynolds Bowl 11/19/2012, 11:56 AM

## 2012-11-20 DIAGNOSIS — F19921 Other psychoactive substance use, unspecified with intoxication with delirium: Secondary | ICD-10-CM

## 2012-11-20 DIAGNOSIS — F1020 Alcohol dependence, uncomplicated: Secondary | ICD-10-CM

## 2012-11-20 MED ORDER — CITALOPRAM HYDROBROMIDE 10 MG PO TABS: 10.0000 mg | ORAL_TABLET | Freq: Every day | ORAL | Status: DC

## 2012-11-20 MED ORDER — LISINOPRIL 10 MG PO TABS: 10.0000 mg | ORAL_TABLET | Freq: Every day | ORAL | Status: DC

## 2012-11-20 MED ORDER — TRAZODONE HCL 50 MG PO TABS: 50.0000 mg | ORAL_TABLET | Freq: Every evening | ORAL | Status: DC | PRN

## 2012-11-20 NOTE — Progress Notes (Signed)
The focus of this group is to educate the patient on the purpose and policies of crisis stabilization and provide a format to answer questions about their admission.  The group details unit policies and expectations of patients while admitted.  Patient attended and actively participated in the group.  He was interactive and respectful of his peers.

## 2012-11-20 NOTE — Progress Notes (Signed)
Recreation Therapy Notes  Date: 09.04.2014 Time: 2:30pm Location: [address] Dayroom  Group Topic: Animal Assisted Activities (AAA)  Behavioral Response: Did not attend  Makinna Andy L Leverne Tessler, LRT/CTRS  Sofya Moustafa L 11/20/2012 4:36 PM 

## 2012-11-20 NOTE — Progress Notes (Signed)
[facility] Adult Case Management Discharge Plan :  Will you be returning to the same living situation after discharge: No. At discharge, do you have transportation home?:Yes,  bus pass or friend will pick him up. Do you have the ability to pay for your medications:Yes,  mental health  Release of information consent forms completed and in the chart;  Patient's signature needed at discharge.  Patient to Follow up at: Follow-up Information   Follow up with [facility]. (Walk in Monday through Friday between 8AM-9AM for hospital followup/medication management. )    Contact information:   [address], [place] [address] Phone: [phone] Fax: [phone]      Follow up with [facility] On 11/25/2012. (Arrive by 8AM with 30 day medication supply, ID, and clothing. Contact person that approved your admission: JEFF ALDRIDGE)    Contact information:   [address] [place], [place] [address] Phone: [phone] Fax: [phone]      Patient denies SI/HI:   Yes,  during admisson and during stay at [facility]    Safety Planning and Suicide Prevention discussed:  Yes,  SPE not required for this pt as he did not endorse SI during admission or during stay. SPI pamphlet provided to pt and he was encouraged to ask questions, talk about concerns, and share with his support system.   Smart, Rhianne Soman 11/20/2012, 10:44 AM

## 2012-11-20 NOTE — [facility] Group Notes (Signed)
[facility] LCSW Group Therapy  11/20/2012 2:25 PM  Type of Therapy:  Group Therapy  Participation Level:  Active  Participation Quality:  Attentive  Affect:  Excited  Cognitive:  Alert  Insight:  Limited  Engagement in Therapy:  Engaged  Modes of Intervention:  Discussion, Education, Exploration, Socialization and Support  Summary of Progress/Problems:  Finding Balance in Life. Today's group focused on defining balance in one's own words, identifying things that can knock one off balance, and exploring healthy ways to maintain balance in life. Group members were asked to provide an example of a time when they felt off balance, describe how they handled that situation,and process healthier ways to regain balance in the future. Group members were asked to share the most important tool for maintaining balance that they learned while at [facility] and how they plan to apply this method after discharge. [name] stated that his biggest challenge to reestablishing balance is his "chronic homelessness." [name] stated that his drinking is often an attempt to numb the depression caused by his lack of stable housing but acknowledged that this causes further problems--physical health problems, money issues, further mental health problems, and continuing strain between family members. [name] shows progress in the group setting AEB his ability to actively participate in group discussion and identify both triggers and potential ways to reestablish a sense of balance (going to [facility], applying for Medicaid or Disability, reaching out to support system, staying away from negative environments). [name] would occassionally monopolize conversation or get off topic but was easily redirectable by CSW. Pt shows limited but improving insight in the group setting.    Smart, Johnni Wunschel 11/20/2012, 2:25 PM

## 2012-11-20 NOTE — [facility] Suicide Risk Assessment (Signed)
Suicide Risk Assessment  Discharge Assessment     Demographic Factors:  Male and Caucasian  Mental Status Per Nursing Assessment::   On Admission:  NA  Current Mental Status by Physician: In full contact with reality. There are no suicidal ideas, plans or intent. His mood is euthymic. His affect is appropriate. There are no S/S of withdrawal. He is going to stay at a shelter while waiting to get his bed at the [facility] rehab next Tuesday.   Loss Factors: Decline in physical health  Historical Factors: NA  Risk Reduction Factors:   Positive coping skills or problem solving skills  Continued Clinical Symptoms:  Depression:   Comorbid alcohol abuse/dependence Alcohol/Substance Abuse/Dependencies  Cognitive Features That Contribute To Risk:  Closed-mindedness Polarized thinking Thought constriction (tunnel vision)    Suicide Risk:  Minimal: No identifiable suicidal ideation.  Patients presenting with no risk factors but with morbid ruminations; may be classified as minimal risk based on the severity of the depressive symptoms  Discharge Diagnoses:   AXIS I:  Alcohol Dependence/Depressive Disorder NOS AXIS II:  Deferred AXIS III:   Past Medical History  Diagnosis Date  . Hypercholesteremia   . Stroke   . Hypertension   . Alcohol abuse   . Seizures   . Depression   . Homelessness    AXIS IV:  economic problems, housing problems and problems with primary support group AXIS V:  61-70 mild symptoms  Plan Of Care/Follow-up recommendations:  Activity:  as tolerated Diet:  regular Follow up [facility] Sept 9  Is patient on multiple antipsychotic therapies at discharge:  No   Has Patient had three or more failed trials of antipsychotic monotherapy by history:  No  Recommended Plan for Multiple Antipsychotic Therapies: NA  Aisa Schoeppner A 11/20/2012, 2:46 PM

## 2012-11-20 NOTE — Clinical Social Work Note (Signed)
CSW left message for Morrie Sheldon at [employer] and in general mailbox for [facility] [place] to check for bed availability in shelter. No beds available at [facility] or [facility] today. [facility] in [place] stated that they had bed availability last night and are expecting to have bed available for pt this evening. Check in at [facility]. CSW encouraged pt to call his friend Annette Stable in order to ask if he can stay at his home for five nights until [facility] admission on Tuesday morning. Pt signed consent for CSW to contact Bill as well (must call after 12PM). Plan B: pt will be provided with bus pass and part bus $ to go to [facility] in [place] if pt's friend refuses to take him in.

## 2012-11-20 NOTE — Progress Notes (Signed)
Patient ID: [name] [name], male   DOB: Mar 03, 1951, 62 y.o.   MRN: [id number] Pt discharged to friend at this time. Pt denies SI/HI. Pt provided with discharge instructions and pt verbalized understanding. Pt also provided with prescriptions and supply of medications. Pt given bus pass and money for PART bus transportation. All belongings were returned.

## 2012-11-20 NOTE — Clinical Social Work Note (Signed)
CSW contacted [facility]. No beds available today. Pt signed consent for CSW to contact his friend [name] [name]. Annette Stable was unwilling to take in patient for the next five days prior to [facility] admission but stated that he wanted [name] to call him prior to d/c. [name] will be provided with a bus pass and part bus money to go to [facility] in [place]. This was the only shelter that said they may have openings this evening. Pt must arrive between 7-8PM to be accepted into shelter.

## 2012-11-20 NOTE — Progress Notes (Signed)
Recreation Therapy Notes  Date: 09.04.2014 Time: 3:00pm Location: [address] Dayroom   Group Topic: Coping Skills  Goal Area(s) Addresses:  Patient will identify things they are grateful for. Patient will identify how being grateful can influence decision making.   Behavioral Response: Appropriate, Attentive, Engaged, Active Listener  Education:  Coping Skills, Discharge Planning  Education Outcome: Acknowledges understanding  Clinical Observations/Feedback: Patient arrived to group session at approximately 3:20pm. Upon arriving patient engaged in activity. Patient completed worksheet, but chose not to share with group. Patient made no contributions to wrap up discussion, but appeared to actively listen as he maintained appropriate eye contact with LRT and nodded head in agreement with points of interest.   Jearl Klinefelter, LRT/CTRS  [name] [name] L 11/20/2012 4:20 PM

## 2012-11-20 NOTE — Discharge Summary (Signed)
Physician Discharge Summary Note  Patient:  [name] [name] is an 62 y.o., male MRN:  [id number] DOB:  09-05-1950 Patient phone:  [phone] (home)  Patient address:   [address] [place] [address],   Date of Admission:  11/13/2012 Date of Discharge: 11/20/12  Reason for Admission: Alcohol detox  Discharge Diagnoses: Principal Problem:   Alcohol withdrawal Active Problems:   Depressive disorder   Alcohol dependence  Review of Systems  Constitutional: Negative.   HENT: Negative.   Eyes: Negative.   Respiratory: Negative.   Cardiovascular: Negative.   Gastrointestinal: Negative.   Genitourinary: Negative.   Musculoskeletal: Negative.   Skin: Negative.   Neurological: Negative.   Endo/Heme/Allergies: Negative.   Psychiatric/Behavioral: Positive for depression (Stabilized with medication prior to discharge) and substance abuse (Alcohol dependence). Negative for suicidal ideas, hallucinations and memory loss. The patient is nervous/anxious (Stabilized with medication prior to discharge) and has insomnia (Stabilized with medication prior to discharge).     DSM5:  Schizophrenia Disorders:  NA Obsessive-Compulsive Disorders:  NA Trauma-Stressor Disorders:  NA Substance/Addictive Disorders:  Alcohol Withdrawal (291.81),  Alcohol dependence  Depressive Disorders:  Major Depressive Disorder - Moderate (296.22)  Axis Diagnosis:   AXIS I:  Alcohol dependence, Major depression, Alcohol withdrawal AXIS II:  Deferred AXIS III:   Past Medical History  Diagnosis Date  . Hypercholesteremia   . Stroke   . Hypertension   . Alcohol abuse   . Seizures   . Depression   . Homelessness    AXIS IV:  economic problems, housing problems, occupational problems, problems with primary support group and Alcoholism AXIS V:  60  Level of Care:  RTC, OP  Hospital Course:  This is a 62 year old Caucasian male. Ad[facility] to [facility] from the [facility] with complaints of  alcohol intoxication and withdrawal symptoms. Patient reports, "The ambulance took me to the [facility] on the 25th of August. That was also my birthday. On that day, I had fallen down on the [address]. I was also drunk at the time. I was having a bad case of diarrhea the same day. While at the hospital, I was told that my body has been washed out while I was living on the streets. I have been homeless x 8 years. The homeless shelter staff are not good to me. I was harassed and thrown out of the shelter when I tried to stay there. People were still drinking and using drugs there. I watch people shoot up there all the time. Right now, I need to be habilitated so that I can quit alcohol for good. I drink every kind of alcohol there is out there, including mouth wash. I drink a lot of mouth wash because it is legal. I drink about 2 pints of liquor daily. I was sober from alcohol for 6 years starting in 1981. The reason why I sobered up was I was fighting for the custody of my only child/ daughter. Alcohol serves a sedative for me. That is how I'm surviving on the streets. I started alcohol at the of 18. I had been to the [facility] in [facility], and also the [facility] 2-3 months ago. I have had 5 DWI charge against me in the past".  After admission assessment and evaluation including UDS/toxicology results that showed blood alcohol level of 282, it was determined that Mr. Casanas will need detoxification treatment protocol to stabilize his system of alcohol intoxication and to combat the  withdrawal symptoms of alcohol as well. And his discharge plans included a referral to a long term treatment facility [facility]) for a more intense substance abuse treatment. Mr. Dyal was then started on Librium detoxification treatment protocol for his alcohol detoxification. He was also enrolled in group counseling sessions and activities to learn coping skills that should help  him after discharge to cope better, manage his substance abuse problems to maintain a much longer sobriety. However, Mr. Mowatt was noted to be unsteady on his feet, and did pose a fall risks. He had also reported that he has fallen on numerous occasions in the past. As a result, he was placed on 1:1 supervision for safety until his weak gait/balance improved. Then the 1:1 supervision discontinued  Besides the detoxification treatment protocol, patient also was ordered and received Citalopram 10 mg Q daily for depression and Trazodone 50 mg Q bedtime for sleep. He was also enrolled and attended AA/NA meetings being offered and held on this unit. Mr. Knoble has some other previous and or identifiable medical conditions that required treatment and or monitoring. He received medication management for all those health issues as well, including a physical therapy consults/treatments for his weak gait and balance. He was monitored closely for any potential problems that may arise as a result of and or during detoxification treatment. Patient tolerated his treatment regimen and detoxification treatment without any significant adverse effects and or reactions reported.  Patient attended treatment team meeting this am and met with the treatment team members. His reason for admission, present symptoms, substance abuse issues, response to treatment and discharge plans discussed. Patient endorsed that he is doing well and stable for discharge to pursue the next phase of his substance abuse treatment. It was agreed upon that he will continue substance abuse treatment at the [facility] in [place], [place] on 11/25/12 at 08:00 am. He is instructed to report at the lobby on the morning of 11/25/12 at 08:00 am. The address, date, time and contact information provided for patient in writing. And for the mean time, he is being discharged to the home of his friend until the 11/25/12. For routine psychiatric care/routine  medication management, [name] will follow-up at the [facility] clinic here in [place], [place]. He has been informed that this a walk-in appointment between the hours of 08 & 09:00 am. The addresses, dates, times and contact information for the treatment center and [facility] clinic provided for patient in writing.  Upon discharge, patient adamantly denies suicidal, homicidal ideations, auditory, visual hallucinations, delusional thinking and or withdrawal symptoms. Patient left [facility] with all personal belongings in no apparent distress. He received 2 weeks worth supply samples of his [facility] discharge medications. Transportation per friend.   Consults:  psychiatry  Significant Diagnostic Studies:  labs: CBC with diff, CMP, UDS, toxicology tests, U/A  Discharge Vitals:   Blood pressure 144/92, pulse 79, temperature 98.1 F (36.7 C), temperature source Oral, resp. rate 20, height 5\' 5"  (1.651 m), weight 68.947 kg (152 lb), SpO2 97.00%. Body mass index is 25.29 kg/(m^2). Lab Results:   No results found for this or any previous visit (from the past 72 hour(s)).  Physical Findings: AIMS: Facial and Oral Movements Muscles of Facial Expression: None, normal Lips and Perioral Area: None, normal Jaw: None, normal Tongue: None, normal,Extremity Movements Upper (arms, wrists, hands, fingers): None, normal Lower (legs, knees, ankles, toes): None, normal, Trunk Movements Neck, shoulders, hips: None, normal, Overall Severity Severity of abnormal movements (highest score from  questions above): None, normal Incapacitation due to abnormal movements: None, normal Patient's awareness of abnormal movements (rate only patient's report): No Awareness, Dental Status Current problems with teeth and/or dentures?: No Does patient usually wear dentures?: No  CIWA:  CIWA-Ar Total: 0 COWS:     Psychiatric Specialty Exam: See Psychiatric Specialty Exam and Suicide Risk Assessment completed by Attending Physician prior to  discharge.  Discharge destination:  Home  Is patient on multiple antipsychotic therapies at discharge:  No   Has Patient had three or more failed trials of antipsychotic monotherapy by history:  No  Recommended Plan for Multiple Antipsychotic Therapies: NA     Medication List       Indication   citalopram 10 MG tablet  Commonly known as:  CELEXA  Take 1 tablet (10 mg total) by mouth daily at 8 pm. For depression   Indication:  Depression     lisinopril 10 MG tablet  Commonly known as:  PRINIVIL,ZESTRIL  Take 1 tablet (10 mg total) by mouth daily. For high blood pressure control   Indication:  High Blood Pressure     traZODone 50 MG tablet  Commonly known as:  DESYREL  Take 1 tablet (50 mg total) by mouth at bedtime and may repeat dose one time if needed. For sleep   Indication:  Trouble Sleeping       Follow-up Information   Follow up with [facility]. (Walk in Monday through Friday between 8AM-9AM for hospital followup/medication management. )    Contact information:   [address], [place] [address] Phone: [phone] Fax: [phone]      Follow up with [facility] On 11/25/2012. (Arrive by 8AM with 30 day medication supply, ID, and clothing. Contact person that approved your admission: JEFF ALDRIDGE)    Contact information:   [address] [place], [place] [address] Phone: [phone] Fax: [phone]     Follow-up recommendations:  Activity:  As tolerated Diet: As recommended by your primary care doctor. Keep all scheduled follow-up appointments as recommended.  Comments: Take all your medications as prescribed by your mental healthcare provider. Report any adverse effects and or reactions from your medicines to your outpatient provider promptly. Patient is instructed and cautioned to not engage in alcohol and or illegal drug use while on prescription medicines. In the event of worsening symptoms, patient is instructed to call the crisis  hotline, 911 and or go to the nearest ED for appropriate evaluation and treatment of symptoms. Follow-up with your primary care provider for your other medical issues, concerns and or health care needs.    Agree with assessment and plan Total Discharge Time:  Greater than 30 minutes.  Signed: Armandina Stammer I, PMHNP 11/20/2012, 11:09 AM Reymundo Poll. Dub Mikes, M.D.

## 2012-11-20 NOTE — Progress Notes (Signed)
Adult Psychoeducational Group Note  Date:  11/20/2012 Time:  1:42 PM  Group Topic/Focus:  Building Self Esteem:   The Focus of this group is helping patients become aware of the effects of self-esteem on their lives, the things they and others do that enhance or undermine their self-esteem, seeing the relationship between their level of self-esteem and the choices they make and learning ways to enhance self-esteem.  Participation Level:  Active  Participation Quality:  Appropriate and Attentive  Affect:  Appropriate  Cognitive:  Alert and Appropriate  Insight: Good  Engagement in Group:  Engaged  Modes of Intervention:  Discussion, Socialization and Support  Additional Comments:  Pt came to group and shared that alcohol and inactivity were negatively effecting his self-esteem. Pt plans on changing this by doing a rehab program and joining classes to take up his spare time.   Cathlean Cower 11/20/2012, 1:42 PM

## 2012-11-21 NOTE — Progress Notes (Signed)
Adult Psychoeducational Group Note  Date:  11/21/2012 Time:  12:08 PM  Group Topic/Focus:  Relapse Prevention Planning:   The focus of this group is to define relapse and discuss the need for planning to combat relapse.  Participation Level:  Did Not Attend   [name] [name] 11/21/2012, 12:08 PM

## 2012-11-25 NOTE — Progress Notes (Signed)
Patient Discharge Instructions:  After Visit Summary (AVS):   Faxed to:  11/25/12 Discharge Summary Note:   Faxed to:  11/25/12 Psychiatric Admission Assessment Note:   Faxed to:  11/25/12 Suicide Risk Assessment - Discharge Assessment:   Faxed to:  11/25/12 Faxed/Sent to the Next Level Care provider:  11/25/12 Faxed to [facility] @ [phone] Faxed to [facility] @ [phone] Jerelene Redden, 11/25/2012, 2:56 PM

## 2013-01-28 ENCOUNTER — Encounter (HOSPITAL_COMMUNITY): Payer: Self-pay | Admitting: Emergency Medicine

## 2013-01-28 DIAGNOSIS — F3289 Other specified depressive episodes: Secondary | ICD-10-CM | POA: Insufficient documentation

## 2013-01-28 DIAGNOSIS — Z862 Personal history of diseases of the blood and blood-forming organs and certain disorders involving the immune mechanism: Secondary | ICD-10-CM | POA: Insufficient documentation

## 2013-01-28 DIAGNOSIS — I10 Essential (primary) hypertension: Secondary | ICD-10-CM | POA: Insufficient documentation

## 2013-01-28 DIAGNOSIS — M549 Dorsalgia, unspecified: Secondary | ICD-10-CM | POA: Insufficient documentation

## 2013-01-28 DIAGNOSIS — R109 Unspecified abdominal pain: Secondary | ICD-10-CM | POA: Insufficient documentation

## 2013-01-28 DIAGNOSIS — Z5900 Homelessness unspecified: Secondary | ICD-10-CM | POA: Insufficient documentation

## 2013-01-28 DIAGNOSIS — Z8639 Personal history of other endocrine, nutritional and metabolic disease: Secondary | ICD-10-CM | POA: Insufficient documentation

## 2013-01-28 DIAGNOSIS — F329 Major depressive disorder, single episode, unspecified: Secondary | ICD-10-CM | POA: Insufficient documentation

## 2013-01-28 DIAGNOSIS — F39 Unspecified mood [affective] disorder: Secondary | ICD-10-CM | POA: Insufficient documentation

## 2013-01-28 DIAGNOSIS — R45851 Suicidal ideations: Secondary | ICD-10-CM | POA: Insufficient documentation

## 2013-01-28 DIAGNOSIS — F17200 Nicotine dependence, unspecified, uncomplicated: Secondary | ICD-10-CM | POA: Insufficient documentation

## 2013-01-28 DIAGNOSIS — Z8669 Personal history of other diseases of the nervous system and sense organs: Secondary | ICD-10-CM | POA: Insufficient documentation

## 2013-01-28 DIAGNOSIS — Z8673 Personal history of transient ischemic attack (TIA), and cerebral infarction without residual deficits: Secondary | ICD-10-CM | POA: Insufficient documentation

## 2013-01-28 DIAGNOSIS — R079 Chest pain, unspecified: Secondary | ICD-10-CM | POA: Insufficient documentation

## 2013-01-28 DIAGNOSIS — Z79899 Other long term (current) drug therapy: Secondary | ICD-10-CM | POA: Insufficient documentation

## 2013-01-28 DIAGNOSIS — F1020 Alcohol dependence, uncomplicated: Secondary | ICD-10-CM | POA: Insufficient documentation

## 2013-01-28 NOTE — ED Notes (Addendum)
Presents with left sided pain from head to toes began 3 days ago,  Also reports left sided chest pain associated with SOB that began 3 days ago and worsened. Pain in chest is described as "smothering" denies nausea, vomiting. SAts 99%, breath sounds clear.  Reports, "I have thought of killing myself over the last few days. I am suicidal I just have not figured out how to do it"

## 2013-01-28 NOTE — ED Notes (Signed)
Per ems report.Marland KitchenMarland KitchenPt reports left sided abdominal pain, worse to palpation, denies n & v,  started today, EKG WNL, 118/82,86, 96%RA. NAD noted

## 2013-01-29 ENCOUNTER — Emergency Department (HOSPITAL_COMMUNITY)
Admission: EM | Admit: 2013-01-29 | Discharge: 2013-01-30 | Disposition: A | Discharge status: Other Acute Inpatient Hospital | Payer: No Typology Code available for payment source | Attending: Emergency Medicine | Admitting: Emergency Medicine

## 2013-01-29 DIAGNOSIS — R45851 Suicidal ideations: Secondary | ICD-10-CM

## 2013-01-29 DIAGNOSIS — F329 Major depressive disorder, single episode, unspecified: Secondary | ICD-10-CM

## 2013-01-29 DIAGNOSIS — F1020 Alcohol dependence, uncomplicated: Secondary | ICD-10-CM

## 2013-01-29 LAB — COMPREHENSIVE METABOLIC PANEL
ALT: 26 U/L (ref 0–53)
Albumin: 3.6 g/dL (ref 3.5–5.2)
Alkaline Phosphatase: 76 U/L (ref 39–117)
BUN: 10 mg/dL (ref 6–23)
CO2: 25 mEq/L (ref 19–32)
Chloride: 102 mEq/L (ref 96–112)
Creatinine, Ser: 0.75 mg/dL (ref 0.50–1.35)
GFR calc Af Amer: >90 mL/min (ref >90)
GFR calc non Af Amer: >90 mL/min (ref >90)
Glucose, Bld: 99 mg/dL (ref 70–99)
Potassium: 4 mEq/L (ref 3.5–5.1)
Sodium: 140 mEq/L (ref 135–145)
Total Bilirubin: 0.2 mg/dL — ABNORMAL LOW (ref 0.3–1.2)

## 2013-01-29 LAB — SALICYLATE LEVEL: Salicylate Lvl: <2 mg/dL — ABNORMAL LOW (ref 2.8–20.0)

## 2013-01-29 LAB — RAPID URINE DRUG SCREEN, HOSP PERFORMED
Amphetamines: NOT DETECTED
Barbiturates: NOT DETECTED
Benzodiazepines: NOT DETECTED
Opiates: NOT DETECTED
Tetrahydrocannabinol: NOT DETECTED

## 2013-01-29 LAB — CBC WITH DIFFERENTIAL/PLATELET
Basophils Absolute: 0 10*3/uL (ref 0.0–0.1)
Basophils Relative: 0 % (ref 0–1)
Eosinophils Absolute: 0.2 10*3/uL (ref 0.0–0.7)
Eosinophils Relative: 3 % (ref 0–5)
HCT: 37.6 % — ABNORMAL LOW (ref 39.0–52.0)
Lymphocytes Relative: 33 % (ref 12–46)
MCHC: 33.8 g/dL (ref 30.0–36.0)
MCV: 102.2 fL — ABNORMAL HIGH (ref 78.0–100.0)
Monocytes Absolute: 0.6 10*3/uL (ref 0.1–1.0)
Platelets: 180 10*3/uL (ref 150–400)
RDW: 14.2 % (ref 11.5–15.5)
WBC: 4.7 10*3/uL (ref 4.0–10.5)

## 2013-01-29 LAB — LIPASE, BLOOD: Lipase: 23 U/L (ref 11–59)

## 2013-01-29 MED ORDER — LISINOPRIL 10 MG PO TABS
10.0000 mg | ORAL_TABLET | Freq: Every day | ORAL | Status: DC
  Administered 2013-01-29 – 2013-01-30 (×2): 10 mg via ORAL
  Filled 2013-01-29 (×2): qty 1

## 2013-01-29 MED ORDER — VITAMIN B-1 100 MG PO TABS
100.0000 mg | ORAL_TABLET | Freq: Once | ORAL | Status: AC
  Administered 2013-01-29: 06:00:00 100 mg via ORAL
  Filled 2013-01-29: qty 1

## 2013-01-29 MED ORDER — AMLODIPINE BESYLATE 10 MG PO TABS
10.0000 mg | ORAL_TABLET | Freq: Once | ORAL | Status: AC
  Administered 2013-01-29: 10 mg via ORAL
  Filled 2013-01-29: qty 1

## 2013-01-29 MED ORDER — THIAMINE HCL 100 MG/ML IJ SOLN: 100.0000 mg | Freq: Once | INTRAMUSCULAR | Status: DC

## 2013-01-29 MED ORDER — CITALOPRAM HYDROBROMIDE 10 MG PO TABS
10.0000 mg | ORAL_TABLET | Freq: Every day | ORAL | Status: DC
  Administered 2013-01-29 – 2013-01-30 (×2): 10 mg via ORAL
  Filled 2013-01-29 (×2): qty 1

## 2013-01-29 MED ORDER — LORAZEPAM 1 MG PO TABS
0.0000 mg | ORAL_TABLET | Freq: Four times a day (QID) | ORAL | Status: DC
  Administered 2013-01-29 – 2013-01-30 (×4): 1 mg via ORAL
  Filled 2013-01-29 (×4): qty 1

## 2013-01-29 MED ORDER — TRAZODONE HCL 50 MG PO TABS
50.0000 mg | ORAL_TABLET | Freq: Every evening | ORAL | Status: DC | PRN
  Filled 2013-01-29: qty 1

## 2013-01-29 MED ORDER — LORAZEPAM 1 MG PO TABS: 0.0000 mg | ORAL_TABLET | Freq: Two times a day (BID) | ORAL | Status: DC

## 2013-01-29 NOTE — ED Notes (Signed)
telepsych in progress 

## 2013-01-29 NOTE — ED Provider Notes (Signed)
CSN: [id number]     Arrival date & time 01/28/13  2333 History   First MD Initiated Contact with Patient 01/29/13 0341     Chief Complaint  Patient presents with  . Abdominal Pain  . Medical Clearance   (Consider location/radiation/quality/duration/timing/severity/associated sxs/prior Treatment) HPI Comments: 62 yo male with htn, alcohol abuse, depression, homeless presents with multiple complaints and suicidal ideation.  Pt has had intermittent body aches worse with movement, anterior chest pain with out radiation- no diaphoresis or exertional component, no cardiac hx.  The main reason for ED visit per pt is he feels hopeless, no support, drinks daily, two weeks ago took trazodone with etoh to try and kill himself.  Pt has hx of multiple suicidal ideations and etoh abuse hx.  Last drink today. No ingestion of drugs today. Nothing improves sxs. No cp in ED.   Patient is a 62 y.o. male presenting with abdominal pain. The history is provided by the patient.  Abdominal Pain Associated symptoms: chest pain   Associated symptoms: no chills, no dysuria, no fever, no shortness of breath and no vomiting     Past Medical History  Diagnosis Date  . Hypercholesteremia   . Stroke   . Hypertension   . Alcohol abuse   . Seizures   . Depression   . Homelessness    History reviewed. No pertinent past surgical history. History reviewed. No pertinent family history. History  Substance Use Topics  . Smoking status: Current Every Day Smoker -- 0.25 packs/day    Types: Cigarettes  . Smokeless tobacco: Not on file  . Alcohol Use: Yes     Comment: drinks bottles of 2 pint  mouthwash daily    Review of Systems  Constitutional: Negative for fever and chills.  HENT: Negative for congestion.   Eyes: Negative for visual disturbance.  Respiratory: Negative for shortness of breath.   Cardiovascular: Positive for chest pain.  Gastrointestinal: Positive for abdominal pain. Negative for vomiting.   Genitourinary: Negative for dysuria and flank pain.  Musculoskeletal: Positive for arthralgias and back pain. Negative for neck pain and neck stiffness.  Skin: Negative for rash.  Neurological: Negative for light-headedness and headaches.  Psychiatric/Behavioral: Positive for suicidal ideas and dysphoric mood.    Allergies  Review of patient's allergies indicates no known allergies.  Home Medications   Current Outpatient Rx  Name  Route  Sig  Dispense  Refill  . amLODipine (NORVASC) 10 MG tablet   Oral   Take 10 mg by mouth daily.         . citalopram (CELEXA) 10 MG tablet   Oral   Take 1 tablet (10 mg total) by mouth daily at 8 pm. For depression   30 tablet   0   . lisinopril (PRINIVIL,ZESTRIL) 10 MG tablet   Oral   Take 1 tablet (10 mg total) by mouth daily. For high blood pressure control   30 tablet   0   . traZODone (DESYREL) 50 MG tablet   Oral   Take 1 tablet (50 mg total) by mouth at bedtime and may repeat dose one time if needed. For sleep   60 tablet   0    BP 132/85  Pulse 102  Temp(Src) 98.9 F (37.2 C) (Oral)  Resp 18  SpO2 98% Physical Exam  Nursing note and vitals reviewed. Constitutional: He is oriented to person, place, and time. He appears well-developed and well-nourished.  HENT:  Head: Normocephalic and atraumatic.  Eyes: Conjunctivae are  normal. Right eye exhibits no discharge. Left eye exhibits no discharge.  Neck: Normal range of motion. Neck supple. No tracheal deviation present.  Cardiovascular: Normal rate and regular rhythm.   Pulmonary/Chest: Effort normal and breath sounds normal.  Abdominal: Soft. He exhibits no distension. There is no tenderness. There is no guarding.  Musculoskeletal: He exhibits no edema and no tenderness.  Neurological: He is alert and oriented to person, place, and time.  Skin: Skin is warm. No rash noted.  Psychiatric: His speech is not rapid and/or pressured. Thought content is not paranoid. He  expresses suicidal ideation. He expresses no homicidal ideation. He expresses suicidal plans. He expresses no homicidal plans.  Mild clinical intoxicated, answers all questions tearful    ED Course  Procedures (including critical care time) Labs Review Labs Reviewed  COMPREHENSIVE METABOLIC PANEL - Abnormal; Notable for the following:    Total Bilirubin 0.2 (*)    All other components within normal limits  ETHANOL - Abnormal; Notable for the following:    Alcohol, Ethyl (B) 233 (*)    All other components within normal limits  SALICYLATE LEVEL - Abnormal; Notable for the following:    Salicylate Lvl <2.0 (*)    All other components within normal limits  CBC WITH DIFFERENTIAL - Abnormal; Notable for the following:    RBC 3.68 (*)    Hemoglobin 12.7 (*)    HCT 37.6 (*)    MCV 102.2 (*)    MCH 34.5 (*)    All other components within normal limits  LIPASE, BLOOD  ACETAMINOPHEN LEVEL  TROPONIN I  CBC  URINE RAPID DRUG SCREEN (HOSP PERFORMED)   Imaging Review No results found.  EKG Interpretation   None     MUSE not working  Date: 01/29/2013  Rate: 93  Rhythm: normal sinus rhythm  QRS Axis: indeterminate  Intervals: QT prolonged  ST/T Wave abnormalities: normal  Conduction Disutrbances:none  Narrative Interpretation: no acute findings    MDM  No diagnosis found. Chest pain, joint pains and abd pain benign in ED.  EKG no acute findings, troponin neg. Pt medically clear at this time in ED.  Moved to Pod C for psych assessment in am. Pt will need reassessment once not clinically intox in am to determine severity of suicidal ideation/ recent attempt.  No cp in ED.  Holding orders placed.   No signs of withdrawal at this time.  Suicidal, ETOH abuse, depression, chest pain    Enid Skeens, MD 01/29/13 [phone]

## 2013-01-29 NOTE — ED Notes (Signed)
AC and charge nurse notified pt is suicidal
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
## 2013-01-29 NOTE — ED Notes (Signed)
Urine cup to bedside as reminder we need urine sample

## 2013-01-29 NOTE — ED Notes (Signed)
Pt states that he is having "pain all over" but was unable to state where the pain is the worst, or give a number for the pain.  Pt states that he might have taken to much medication a couple days prior while stating "In the motel."  Pt states that now he would have no way to hurt himself when in the hospital

## 2013-01-29 NOTE — BH Assessment (Signed)
Called EDP-Docherty [phone] to obtain clinicals and she was not familiar with this patient so writer read the chart prior to seeing patient.

## 2013-01-29 NOTE — ED Notes (Signed)
Pt wanded by security. 

## 2013-01-29 NOTE — BH Assessment (Signed)
Writer contacted EDP-Docherty [phone] prior to seeing patient to obtain clinicals. However, the EDP is busy with a patient in need of CPR. Writer will call back later to obtain clinicals as the CPR patient takes priority.

## 2013-01-30 ENCOUNTER — Encounter (HOSPITAL_COMMUNITY): Payer: Self-pay | Admitting: *Deleted

## 2013-01-30 ENCOUNTER — Inpatient Hospital Stay (HOSPITAL_COMMUNITY)
Admission: AD | Admit: 2013-01-30 | Discharge: 2013-02-06 | DRG: 897 | Disposition: A | Discharge status: Home / Self Care | Payer: Federal, State, Local not specified - Other | Source: Intra-hospital | Attending: Psychiatry | Admitting: Psychiatry

## 2013-01-30 DIAGNOSIS — F1010 Alcohol abuse, uncomplicated: Secondary | ICD-10-CM | POA: Diagnosis present

## 2013-01-30 DIAGNOSIS — F10239 Alcohol dependence with withdrawal, unspecified: Principal | ICD-10-CM | POA: Diagnosis present

## 2013-01-30 DIAGNOSIS — I6350 Cerebral infarction due to unspecified occlusion or stenosis of unspecified cerebral artery: Secondary | ICD-10-CM

## 2013-01-30 DIAGNOSIS — F332 Major depressive disorder, recurrent severe without psychotic features: Secondary | ICD-10-CM | POA: Diagnosis present

## 2013-01-30 DIAGNOSIS — R443 Hallucinations, unspecified: Secondary | ICD-10-CM

## 2013-01-30 DIAGNOSIS — Z79899 Other long term (current) drug therapy: Secondary | ICD-10-CM

## 2013-01-30 DIAGNOSIS — I10 Essential (primary) hypertension: Secondary | ICD-10-CM | POA: Diagnosis present

## 2013-01-30 DIAGNOSIS — J019 Acute sinusitis, unspecified: Secondary | ICD-10-CM

## 2013-01-30 DIAGNOSIS — R45851 Suicidal ideations: Secondary | ICD-10-CM

## 2013-01-30 DIAGNOSIS — G40901 Epilepsy, unspecified, not intractable, with status epilepticus: Secondary | ICD-10-CM

## 2013-01-30 DIAGNOSIS — F10939 Alcohol use, unspecified with withdrawal, unspecified: Principal | ICD-10-CM | POA: Diagnosis present

## 2013-01-30 DIAGNOSIS — F411 Generalized anxiety disorder: Secondary | ICD-10-CM | POA: Diagnosis present

## 2013-01-30 DIAGNOSIS — F1020 Alcohol dependence, uncomplicated: Secondary | ICD-10-CM | POA: Diagnosis present

## 2013-01-30 DIAGNOSIS — Z5900 Homelessness unspecified: Secondary | ICD-10-CM

## 2013-01-30 DIAGNOSIS — R569 Unspecified convulsions: Secondary | ICD-10-CM

## 2013-01-30 DIAGNOSIS — F329 Major depressive disorder, single episode, unspecified: Secondary | ICD-10-CM

## 2013-01-30 MED ORDER — CHLORDIAZEPOXIDE HCL 25 MG PO CAPS
25.0000 mg | ORAL_CAPSULE | Freq: Every day | ORAL | Status: AC
  Administered 2013-02-04: 25 mg via ORAL
  Filled 2013-01-30: qty 1

## 2013-01-30 MED ORDER — CHLORDIAZEPOXIDE HCL 25 MG PO CAPS
25.0000 mg | ORAL_CAPSULE | Freq: Four times a day (QID) | ORAL | Status: AC | PRN
  Administered 2013-01-30: 25 mg via ORAL

## 2013-01-30 MED ORDER — ACETAMINOPHEN 325 MG PO TABS
650.0000 mg | ORAL_TABLET | Freq: Four times a day (QID) | ORAL | Status: DC | PRN
Start: 2013-01-30 — End: 2013-02-06

## 2013-01-30 MED ORDER — THIAMINE HCL 100 MG/ML IJ SOLN: 100.0000 mg | Freq: Once | INTRAMUSCULAR | Status: DC

## 2013-01-30 MED ORDER — ADULT MULTIVITAMIN W/MINERALS CH
1.0000 | ORAL_TABLET | Freq: Every day | ORAL | Status: DC
  Administered 2013-01-31 – 2013-02-06 (×7): 1 via ORAL
  Filled 2013-01-30 (×10): qty 1

## 2013-01-30 MED ORDER — HYDROXYZINE HCL 25 MG PO TABS
25.0000 mg | ORAL_TABLET | Freq: Four times a day (QID) | ORAL | Status: AC | PRN
  Administered 2013-01-30: 25 mg via ORAL
  Filled 2013-01-30: qty 1

## 2013-01-30 MED ORDER — LOPERAMIDE HCL 2 MG PO CAPS
2.0000 mg | ORAL_CAPSULE | ORAL | Status: AC | PRN
Start: 2013-01-30 — End: 2013-02-02

## 2013-01-30 MED ORDER — TRAZODONE HCL 50 MG PO TABS
50.0000 mg | ORAL_TABLET | Freq: Every evening | ORAL | Status: DC | PRN
  Filled 2013-01-30: qty 1

## 2013-01-30 MED ORDER — VITAMIN B-1 100 MG PO TABS
100.0000 mg | ORAL_TABLET | Freq: Every day | ORAL | Status: DC
  Administered 2013-02-01 – 2013-02-06 (×7): 100 mg via ORAL
  Filled 2013-01-30 (×9): qty 1

## 2013-01-30 MED ORDER — ALUM & MAG HYDROXIDE-SIMETH 200-200-20 MG/5ML PO SUSP
30.0000 mL | ORAL | Status: DC | PRN
  Administered 2013-01-31: 30 mL via ORAL

## 2013-01-30 MED ORDER — CHLORDIAZEPOXIDE HCL 25 MG PO CAPS
25.0000 mg | ORAL_CAPSULE | Freq: Four times a day (QID) | ORAL | Status: AC
  Administered 2013-01-31 – 2013-02-01 (×5): 25 mg via ORAL
  Filled 2013-01-30 (×7): qty 1

## 2013-01-30 MED ORDER — CHLORDIAZEPOXIDE HCL 25 MG PO CAPS
25.0000 mg | ORAL_CAPSULE | ORAL | Status: AC
  Administered 2013-02-02 – 2013-02-03 (×2): 25 mg via ORAL
  Filled 2013-01-30 (×2): qty 1

## 2013-01-30 MED ORDER — ONDANSETRON 4 MG PO TBDP: 4.0000 mg | ORAL_TABLET | Freq: Four times a day (QID) | ORAL | Status: AC | PRN

## 2013-01-30 MED ORDER — CHLORDIAZEPOXIDE HCL 25 MG PO CAPS
25.0000 mg | ORAL_CAPSULE | Freq: Three times a day (TID) | ORAL | Status: AC
  Administered 2013-02-01 – 2013-02-02 (×3): 25 mg via ORAL
  Filled 2013-01-30 (×3): qty 1

## 2013-01-30 MED ORDER — MAGNESIUM HYDROXIDE 400 MG/5ML PO SUSP: 30.0000 mL | Freq: Every day | ORAL | Status: DC | PRN

## 2013-01-30 MED ORDER — INFLUENZA VAC SPLIT QUAD 0.5 ML IM SUSP
0.5000 mL | INTRAMUSCULAR | Status: AC
  Administered 2013-01-31: 0.5 mL via INTRAMUSCULAR
  Filled 2013-01-30: qty 0.5

## 2013-01-30 MED ORDER — ONDANSETRON 4 MG PO TBDP
4.0000 mg | ORAL_TABLET | Freq: Once | ORAL | Status: AC
  Administered 2013-01-30: 4 mg via ORAL
  Filled 2013-01-30: qty 1

## 2013-01-30 NOTE — Tx Team (Signed)
Initial Interdisciplinary Treatment Plan  PATIENT STRENGTHS: (choose at least two) Ability for insight Average or above average intelligence General fund of knowledge  PATIENT STRESSORS: Medication change or noncompliance Substance abuse   PROBLEM LIST: Problem List/Patient Goals Date to be addressed Date deferred Reason deferred Estimated date of resolution  ETOH abuse 01/30/13                                                      DISCHARGE CRITERIA:  Ability to meet basic life and health needs Improved stabilization in mood, thinking, and/or behavior Withdrawal symptoms are absent or subacute and managed without 24-hour nursing intervention  PRELIMINARY DISCHARGE PLAN: Attend aftercare/continuing care group  PATIENT/FAMIILY INVOLVEMENT: This treatment plan has been presented to and reviewed with the patient, [name] [name], and/or family member, .  The patient and family have been given the opportunity to ask questions and make suggestions.  [name] [name], [name] 01/30/2013, 7:30 PM

## 2013-01-30 NOTE — Progress Notes (Signed)
62 year old male pt admitted on voluntary basis. Pt reports that he was at [facility] and did not appreciate the way he was being treated there and opted to leave. Pt reports that he took his retirement money and got a hotel room and has been drinking daily for the past 3 weeks. During this time, pt says he has been inconsistent with his medications. Pt also reports that his walker is at [facility]. Pt is a high fall risk and is unsteady on his feet. Pt reports that he is unsure of living situation when he leaves from here. Pt was oriented to the unit and safety maintained.

## 2013-01-30 NOTE — Progress Notes (Signed)
B.Daishawn Lauf, MHT completed support paper work with patient and faxed to [facility]. Patient signed voluntary admission form along with consent to release although provided no provider or others to release information to.

## 2013-01-30 NOTE — Progress Notes (Signed)
Contacted the following facilities to seek bed availability:   [facility]- Per Debbe Odea are available, will fax referral   [facility]- Per Barnie Del, can fax referral to be reviewed   [facility]- Per Johnny Bridge, no beds available  [facility]- Pt can not be [employer]- Per Selena Batten, at [employer]- No answer   Rodman Pickle, MHT

## 2013-01-30 NOTE — ED Provider Notes (Signed)
62 yo male awaiting admission to [facility] per nursing.  CIWA protocol in place.  No issues during my shift.    [name], MD 01/30/13 [phone]

## 2013-01-30 NOTE — Evaluation (Signed)
Physical Therapy Evaluation Patient Details Name: [name] [name] MRN: [id number] DOB: December 01, 1950 Today's Date: 01/30/2013 Time: 2841-3244 PT Time Calculation (min): 27 min  PT Assessment / Plan / Recommendation History of Present Illness  Initially presented to ED via EMS with Lt abd pain. Once in ED admitted to suicidal ideation. PMHx includes stroke, alcohol abuse, seizures, depression. Pt somewhat poor historian re: to fall history. One fall related to rollator (4 wheeled walker) rolling out from under him when he tried to sit on the seat. Other falls apparently pt off-balance and fell posteriorly. Pt relates a least one episode to medication side effects (drowsiness, unsteady).   Clinical Impression  Patient presents with unsteady gait and prior h/o falls. He reports that on his last admission to [facility] he was given a rollator which he consistently used until he recently left it at the last shelter (?[facility] in [place]). He states he left the rollator and all his clothes there and has no way to get them back. Pt reports one fall since beginning to use rollator (see above), however this would be safest device for pt as he can sit and rest when needed or when standing in line. Unsure if Case Management has resources to assist with getting his rollator returned or obtaining another one for him. Pt is agreeable to continue to work with PT on regaining strength and balance. He fully participated in session today including exercises. If he goes to [facility], he can continue to get PT there. Please order PT on his admission.     PT Assessment  Patient needs continued PT services    Follow Up Recommendations  Other (comment) (PT at [facility])    Does the patient have the potential to tolerate intense rehabilitation      Barriers to Discharge Decreased caregiver support per RN looking to move pt to Behavioral health    Equipment  Recommendations  Other (comment) (pt recently obtained rollator when at [facility]; left at shelter)    Recommendations for Other Services     Frequency Min 2X/week    Precautions / Restrictions Precautions Precautions: Fall Precaution Comments: Pt able to verbalize he should call for assist when getting up to walk, however admits to getting up to go to bathroom alone when staff is not timely enough for him.   Pertinent Vitals/Pain Reports chronic back and LLE pain (neuropathy); number not rated      Mobility  Bed Mobility Bed Mobility: Left Sidelying to Sit;Sitting - Scoot to Edge of Bed;Sit to Supine Left Sidelying to Sit: 6: Modified independent (Device/Increase time);HOB flat Sitting - Scoot to Edge of Bed: 7: Independent Sit to Supine: 7: Independent Details for Bed Mobility Assistance: incr time to come to sitting (drowsy) Transfers Transfers: Sit to Stand;Stand to Sit Sit to Stand: 5: Supervision Stand to Sit: 5: Supervision Details for Transfer Assistance: x3 to practice sequencing with RW for safety; pt able to return demonstrate correctly on 3rd attempt without cues Ambulation/Gait Ambulation/Gait Assistance: 4: Min assist Ambulation Distance (Feet): 80 Feet Assistive device: Rolling walker Ambulation/Gait Assistance Details: assist to maneuver/steer RW (pt tending to run into rail/wall on his Rt); very limited foot clearance, reports cannot incr stride length due to LLE neuropathic pain Gait Pattern: Step-through pattern;Decreased stride length;Shuffle Gait velocity: <1.8 ft/sec indicative of incr fall risk    Exercises Low Level/ICU Exercises Stabilized Bridging: AROM;Both;5 reps;Supine   PT Diagnosis: Difficulty walking  PT Problem List: Decreased strength;Decreased balance;Decreased mobility;Decreased  knowledge of use of DME;Decreased safety awareness;Pain PT Treatment Interventions: DME instruction;Gait training;Functional mobility training;Therapeutic  activities;Therapeutic exercise;Cognitive remediation;Patient/family education     PT Goals(Current goals can be found in the care plan section) Acute Rehab PT Goals Patient Stated Goal: agrees he wants to walk better PT Goal Formulation: With patient Time For Goal Achievement: 02/06/13 Potential to Achieve Goals: Good  Visit Information  Last PT Received On: 01/30/13 Assistance Needed: +1 History of Present Illness: Initially presented to ED via EMS with Lt abd pain. Once in ED admitted to suicidal ideation. PMHx includes stroke, alcohol abuse, seizures, depression. Pt somewhat poor historian re: to fall history. One fall related to rollator (4 wheeled walker) rolling out from under him when he tried to sit on the seat. Other falls apparently pt off-balance and fell posteriorly. Pt relates a least one episode to medication side effects (drowsiness, unsteady).       Prior Functioning  Home Living Family/patient expects to be discharged to:: Shelter/Homeless Living Arrangements: Other (Comment) (homeless) Prior Function Level of Independence: Independent with assistive device(s) Comments: has a rollator he used, but left it at the last shelter (?[facility]) Communication Communication: No difficulties    Cognition  Cognition Arousal/Alertness:  (sleeping; awakens easily but drowsy) Behavior During Therapy: WFL for tasks assessed/performed Overall Cognitive Status: Within Functional Limits for tasks assessed    Extremity/Trunk Assessment Upper Extremity Assessment Upper Extremity Assessment: Overall WFL for tasks assessed Lower Extremity Assessment Lower Extremity Assessment: Generalized weakness   Balance Balance Balance Assessed: Yes Static Sitting Balance Static Sitting - Balance Support: No upper extremity supported;Feet supported Static Sitting - Level of Assistance: 7: Independent Static Standing Balance Static Standing - Balance Support: No upper extremity  supported Static Standing - Level of Assistance: 4: Min assist Static Standing - Comment/# of Minutes: pt initially bracing the back of his lower legs against bed to maintain balance; able to step away from bed without loss of balance and stood without UE support x 20 seconds, however became increasingly anxious stating he cannot tolerate static standing due to back and leg weakness & pain with pt beginning to reach out to hold onto nearby RW  End of Session PT - End of Session Equipment Utilized During Treatment: Gait belt Activity Tolerance: Patient tolerated treatment well Patient left: in bed;with call bell/phone within reach;with nursing/sitter in room Nurse Communication: Mobility status;Other (comment) (left hospital RW for nursing to use with pt)  GP Functional Assessment Tool Used: clinical judgement Functional Limitation: Mobility: Walking and moving around Mobility: Walking and Moving Around Current Status ([id number]): At least 20 percent but less than 40 percent impaired, limited or restricted Mobility: Walking and Moving Around Goal Status [phone]): At least 1 percent but less than 20 percent impaired, limited or restricted   Alexyia Guarino 01/30/2013, 9:45 AM Pager [phone]

## 2013-01-30 NOTE — BH Assessment (Signed)
Patient accepted to [facility] by Verne Spurr, PA. Attending MD Dub Mikes. Bed assigned 305-1.

## 2013-01-30 NOTE — ED Notes (Signed)
PT at bedside to evaluated need for walker.

## 2013-01-31 ENCOUNTER — Encounter (HOSPITAL_COMMUNITY): Payer: Self-pay | Admitting: Psychiatry

## 2013-01-31 DIAGNOSIS — R45851 Suicidal ideations: Secondary | ICD-10-CM

## 2013-01-31 DIAGNOSIS — F332 Major depressive disorder, recurrent severe without psychotic features: Secondary | ICD-10-CM

## 2013-01-31 DIAGNOSIS — F1010 Alcohol abuse, uncomplicated: Secondary | ICD-10-CM

## 2013-01-31 DIAGNOSIS — F411 Generalized anxiety disorder: Secondary | ICD-10-CM

## 2013-01-31 MED ORDER — AMLODIPINE BESYLATE 10 MG PO TABS
10.0000 mg | ORAL_TABLET | Freq: Every day | ORAL | Status: DC
  Administered 2013-01-31 – 2013-02-06 (×7): 10 mg via ORAL
  Filled 2013-01-31 (×5): qty 1
  Filled 2013-01-31: qty 14
  Filled 2013-01-31 (×3): qty 1

## 2013-01-31 MED ORDER — CITALOPRAM HYDROBROMIDE 20 MG PO TABS
20.0000 mg | ORAL_TABLET | Freq: Every day | ORAL | Status: DC
  Administered 2013-01-31 – 2013-02-06 (×7): 20 mg via ORAL
  Filled 2013-01-31 (×4): qty 1
  Filled 2013-01-31: qty 14
  Filled 2013-01-31 (×5): qty 1

## 2013-01-31 MED ORDER — GABAPENTIN 100 MG PO CAPS
ORAL_CAPSULE | ORAL | Status: AC
  Administered 2013-01-31: 12:00:00
  Filled 2013-01-31: qty 2

## 2013-01-31 MED ORDER — GABAPENTIN 100 MG PO CAPS
200.0000 mg | ORAL_CAPSULE | Freq: Four times a day (QID) | ORAL | Status: DC
  Administered 2013-01-31 – 2013-02-02 (×9): 200 mg via ORAL
  Filled 2013-01-31 (×18): qty 2

## 2013-01-31 MED ORDER — CLOPIDOGREL BISULFATE 75 MG PO TABS
75.0000 mg | ORAL_TABLET | Freq: Every day | ORAL | Status: DC
  Administered 2013-01-31 – 2013-02-06 (×7): 75 mg via ORAL
  Filled 2013-01-31 (×5): qty 1
  Filled 2013-01-31: qty 14
  Filled 2013-01-31 (×3): qty 1

## 2013-01-31 MED ORDER — LISINOPRIL 10 MG PO TABS
10.0000 mg | ORAL_TABLET | Freq: Every day | ORAL | Status: DC
  Administered 2013-01-31 – 2013-02-06 (×7): 10 mg via ORAL
  Filled 2013-01-31 (×6): qty 1
  Filled 2013-01-31: qty 14
  Filled 2013-01-31 (×2): qty 1

## 2013-01-31 NOTE — [facility] Suicide Risk Assessment (Signed)
Suicide Risk Assessment  Admission Assessment     Nursing information obtained from:    Demographic factors:    Current Mental Status:    Loss Factors:    Historical Factors:    Risk Reduction Factors:     CLINICAL FACTORS:   Severe Anxiety and/or Agitation Depression:   Anhedonia Comorbid alcohol abuse/dependence Hopelessness Impulsivity Insomnia Recent sense of peace/wellbeing Severe Alcohol/Substance Abuse/Dependencies More than one psychiatric diagnosis Unstable or Poor Therapeutic Relationship Previous Psychiatric Diagnoses and Treatments Medical Diagnoses and Treatments/Surgeries  COGNITIVE FEATURES THAT CONTRIBUTE TO RISK:  Closed-mindedness Loss of executive function Polarized thinking Thought constriction (tunnel vision)    SUICIDE RISK:   Moderate:  Frequent suicidal ideation with limited intensity, and duration, some specificity in terms of plans, no associated intent, good self-control, limited dysphoria/symptomatology, some risk factors present, and identifiable protective factors, including available and accessible social support.  PLAN OF CARE: Patient admitted for alcohol detox treatment and substance induced mood disorder. Patient will receive Librium protocol and case management.  I certify that inpatient services furnished can reasonably be expected to improve the patient's condition.  Aziah Kaiser,JANARDHAHA R. 01/31/2013, 1:36 PM
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
## 2013-01-31 NOTE — Progress Notes (Signed)
Pt did not attend AA group he is currently in bed asleep

## 2013-01-31 NOTE — Progress Notes (Signed)
Pt has multilple complaints about body aches. Gave pt several heat pads to place on his lower back. Pt does need assistance with a wheelchair and walker. Pt is pleasant . He did have his breakfast brought back to him in the room. Pt rates his depression a 8/10 and hopelessness a 8/10.He would like to stop drinking and eat better. Pt is concerned about where to obtain refills. He states his energy level is very low. Pt denies SI or HI and does contract for safety,

## 2013-01-31 NOTE — Progress Notes (Signed)
Pt complained of pain in his abdominal area. Pt was asked to describe pain, but pt could not describe the pain very well,  and offered Tylenol. Pt stated he wanted some alker seltzer, pt was offered Maalox and pt accepted.

## 2013-01-31 NOTE — [facility] Group Notes (Signed)
[facility] Group Notes: (Clinical Social Work)   01/31/2013      Type of Therapy:  Group Therapy   Participation Level:  Did Not Attend    Ambrose Mantle, LCSW 01/31/2013, 12:24 PM
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
## 2013-02-01 DIAGNOSIS — F1910 Other psychoactive substance abuse, uncomplicated: Secondary | ICD-10-CM

## 2013-02-01 DIAGNOSIS — F329 Major depressive disorder, single episode, unspecified: Secondary | ICD-10-CM

## 2013-02-01 NOTE — Progress Notes (Signed)
D: Patient resting in bed with eyes closed.  Respirations even and unlabored.  Patient appears to be in no apparent distress. A: Staff to monitor Q 15 mins for safety.   R:Patient remains safe on the unit.  

## 2013-02-01 NOTE — [facility] Group Notes (Signed)
[facility] Group Notes:  (Clinical Social Work)  02/01/2013  10:00-11:00AM  Summary of Progress/Problems:   The main focus of today's process group was to identify the patient's current support system and decide on other supports that can be put in place.  An emphasis was placed on AA/NA, support groups, therapy groups, and other professional supports in addition to natural supports.  Illustrations were used to demonstrate the need for additional supports.  A lengthy discussion ensued about unhealthy versus healthy supports.  The patient has current supports of several people at a local church who do much for him.  His descriptions made it sound like they have some helpful behaviors, but some that are enabling.  He demonstrated little insight into this as the CSW and group attempted to help him see this.  He is willing to seek other supports, understands the importance of building system in order to preempt future crisis.  Type of Therapy:  Process Group   Participation Level:  Active  Participation Quality:  Attentive, Monopolizing and Sharing  Affect:  Blunted  Cognitive:  Oriented  Insight:  Improving  Engagement in Therapy:  Engaged  Modes of Intervention:   Education, Support and [employer], LCSW 02/01/2013, 12:15pm

## 2013-02-01 NOTE — Progress Notes (Signed)
Pt resting in bed, eyes closed, breathing even and unlabored. No signs of distress noted. Will continue to monitor pt. Q15 min checks maintained for safety. 

## 2013-02-01 NOTE — Progress Notes (Signed)
Nutrition Brief Note  Patient identified on the Malnutrition Screening Tool (MST) Report  Wt Readings from Last 15 Encounters:  01/30/13 163 lb (73.936 kg)  11/13/12 152 lb (68.947 kg)  04/27/12 156 lb 12 oz (71.1 kg)  04/23/12 154 lb 4.8 oz (69.99 kg)  03/10/12 159 lb 2.8 oz (72.201 kg)    Body mass index is 25.52 kg/(m^2). Patient meets criteria for Overweight based on current BMI.   Current diet order is Regular, patient is consuming approximately 100% of meals at this time. Labs and medications reviewed.   No weight loss reported, good intake and appetite.   No nutrition interventions warranted at this time. If nutrition issues arise, please consult RD.   Linnell Fulling, RD, LDN Pager #: [phone] After-Hours Pager #: [phone]

## 2013-02-01 NOTE — Progress Notes (Signed)
[facility] MD Progress Note  02/01/2013 9:34 AM [name] [name]  MRN:  [id number] Subjective:  Patient is alert and oriented and engaged in this assessment and visit.  Reports good appetite, and restless sleep last night.  His depression is stated a 6/10 at this time, has passive thoughts of suicide  With no plan.  He denies HI and hallucination. 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    He speaks optimistically to the fact that he will begin to collect SS and hopes that will help with housing/shelter on discharge. Diagnosis:   DSM5: Substance/Addictive Disorders:  Alcohol Related Disorder - Severe (303.90) and Alcohol Withdrawal (291.81) Depressive Disorders:  Major Depressive Disorder - Severe (296.23)  Axis I: Alcohol Abuse, Depressive Disorder NOS and Substance Abuse  ADL's:  Intact  Sleep: Fair  Appetite:  Good  Suicidal Ideation:  Plan:  fleeting thoughts, no plan, this is patient's baseline according to the patient. Homicidal Ideation: denies  AEB (as evidenced by):  Psychiatric Specialty Exam: Review of Systems  Constitutional: Negative.   Eyes: Positive for redness.  Respiratory: Negative.   Cardiovascular: Negative.   Gastrointestinal: Negative.   Genitourinary: Negative.   Musculoskeletal: Positive for neck pain.  Skin: Negative.   Psychiatric/Behavioral: Positive for depression, suicidal ideas and substance abuse. The patient is nervous/anxious.     Blood pressure 96/69, pulse 100, temperature 97.5 F (36.4 C), temperature source Oral, resp. rate 18, height 5\' 7"  (1.702 m), weight 73.936 kg (163 lb).Body mass index is 25.52 kg/(m^2).  General Appearance: Fairly Groomed  Patent attorney::  Good  Speech:  Clear and Coherent  Volume:  Normal  Mood:  Depressed  Affect:  Congruent  Thought Process:  Loose and Tangential  Orientation:  Full (Time, Place, and Person)  Thought Content:  Paranoid Ideation and Rumination   Suicidal Thoughts:  Yes.  without intent/plan  Homicidal Thoughts:  No  Memory:  Immediate;   Good Recent;   Good Remote;   Good  Judgement:  Impaired  Insight:  Lacking  Psychomotor Activity:  Decreased  Concentration:  Fair  Recall:  Fair  Akathisia:  No  Handed:  Right  AIMS (if indicated):     Assets:  Resilience  Sleep:  Number of Hours: 6   Current Medications: Current Facility-Administered Medications  Medication Dose Route Frequency Provider Last Rate Last Dose  . acetaminophen (TYLENOL) tablet 650 mg  650 mg Oral Q6H PRN Verne Spurr, PA-C      . alum & mag hydroxide-simeth (MAALOX/MYLANTA) 200-200-20 MG/5ML suspension 30 mL  30 mL Oral Q4H PRN Verne Spurr, PA-C   30 mL at 01/31/13 0433  . amLODipine (NORVASC) tablet 10 mg  10 mg Oral Daily Nanine Means, NP   10 mg at 02/01/13 0855  . chlordiazePOXIDE (LIBRIUM) capsule 25 mg  25 mg Oral Q6H PRN Verne Spurr, PA-C   25 mg at 01/30/13 2221  . chlordiazePOXIDE (LIBRIUM) capsule 25 mg  25 mg Oral QID Verne Spurr, PA-C   25 mg at 02/01/13 2130   Followed by  . chlordiazePOXIDE (LIBRIUM) capsule 25 mg  25 mg Oral TID Verne Spurr, PA-C       Followed by  . [START ON 02/02/2013] chlordiazePOXIDE (LIBRIUM) capsule 25 mg  25 mg Oral BH-qamhs Verne Spurr, PA-C       Followed by  . [START ON 02/04/2013] chlordiazePOXIDE (LIBRIUM) capsule 25 mg  25 mg Oral Daily Verne Spurr, PA-C      .  citalopram (CELEXA) tablet 20 mg  20 mg Oral Daily Nanine Means, NP   20 mg at 02/01/13 0855  . clopidogrel (PLAVIX) tablet 75 mg  75 mg Oral Q breakfast Nanine Means, NP   75 mg at 01/31/13 1155  . gabapentin (NEURONTIN) capsule 200 mg  200 mg Oral QID Nanine Means, NP   200 mg at 02/01/13 0855  . hydrOXYzine (ATARAX/VISTARIL) tablet 25 mg  25 mg Oral Q6H PRN Verne Spurr, PA-C   25 mg at 01/30/13 2221  . lisinopril (PRINIVIL,ZESTRIL) tablet 10 mg  10 mg Oral Daily Nanine Means, NP   10 mg at 02/01/13 0856  . loperamide (IMODIUM) capsule  2-4 mg  2-4 mg Oral PRN Verne Spurr, PA-C      . magnesium hydroxide (MILK OF MAGNESIA) suspension 30 mL  30 mL Oral Daily PRN Verne Spurr, PA-C      . multivitamin with minerals tablet 1 tablet  1 tablet Oral Daily Verne Spurr, PA-C   1 tablet at 02/01/13 0855  . ondansetron (ZOFRAN-ODT) disintegrating tablet 4 mg  4 mg Oral Q6H PRN Verne Spurr, PA-C      . thiamine (B-1) injection 100 mg  100 mg Intramuscular Once [employer], PA-C      . thiamine (VITAMIN B-1) tablet 100 mg  100 mg Oral Daily Verne Spurr, PA-C   100 mg at 02/01/13 1610    Lab Results: No results found for this or any previous visit (from the past 48 hour(s)).  Physical Findings: AIMS: Facial and Oral Movements Muscles of Facial Expression: None, normal Lips and Perioral Area: None, normal Jaw: None, normal Tongue: None, normal,Extremity Movements Upper (arms, wrists, hands, fingers): None, normal Lower (legs, knees, ankles, toes): None, normal, Trunk Movements Neck, shoulders, hips: None, normal, Overall Severity Severity of abnormal movements (highest score from questions above): None, normal Incapacitation due to abnormal movements: None, normal Patient's awareness of abnormal movements (rate only patient's report): No Awareness, Dental Status Current problems with teeth and/or dentures?: Yes Does patient usually wear dentures?: No  CIWA:  CIWA-Ar Total: 0 COWS:     Treatment Plan Summary: Daily contact with patient to assess and evaluate symptoms and progress in treatment Medication management Plan:   Review of chart, vital signs, medications, and notes 1. Continue crisis management and stabilization 2. Medication management to reduce current symptoms to base line and improve patient's overall level of functioning 3.  Encourage group and individual 4. Treat health problems as indicted 5. Develop treatment plan to decrease risk of relapse upon discharge and the need for readmission 6.   Psychosocial education regarding relapse prevention and self-care 7. Health care follow up as needed for any health concerns  Medical Decision Making Problem Points:  Established problem, stable/improving (1) Data Points:  Review of medication regiment & side effects (2)  I certify that inpatient services furnished can reasonably be expected to improve the patient's condition.   Lorinda Creed  PMH NP 02/01/2013, 9:34 AM

## 2013-02-01 NOTE — Progress Notes (Signed)
D: Pt denies SI/HI/AV. Pt is pleasant and cooperative. Pt rates depression at a 6 and Helplessness/hopelessness at a 7.  A: Pt was offered support and encouragement. Pt was given scheduled medications. Pt was encourage to attend groups. Q 15 minute checks were done for safety.  R:Pt attends groups and interacts well with peers and staff. Pt  taking medication. Pt has no complaints .Pt receptive to treatment and safety maintained on unit.

## 2013-02-01 NOTE — [facility] Group Notes (Signed)
[facility] Group Notes:  (Nursing/MHT/Case Management/Adjunct)  Date:  02/01/2013  Time:  1300  Type of Therapy:  Nurse Education  Participation Level:  Active  Participation Quality:  Resistant  Affect:  Defensive  Cognitive:  Alert  Insight:  Lacking  Engagement in Group:  Defensive  Modes of Intervention:  Exploration and Limit-setting  Summary of Progress/Problems: Patient focused on the failure of treatment and was difficult to redirect even with peer invovement Cresenciano Lick 02/01/2013, 4:27 PM

## 2013-02-02 DIAGNOSIS — F1994 Other psychoactive substance use, unspecified with psychoactive substance-induced mood disorder: Secondary | ICD-10-CM

## 2013-02-02 DIAGNOSIS — F1020 Alcohol dependence, uncomplicated: Secondary | ICD-10-CM

## 2013-02-02 DIAGNOSIS — F321 Major depressive disorder, single episode, moderate: Secondary | ICD-10-CM

## 2013-02-02 MED ORDER — GABAPENTIN 300 MG PO CAPS
300.0000 mg | ORAL_CAPSULE | Freq: Four times a day (QID) | ORAL | Status: DC
  Administered 2013-02-02 – 2013-02-04 (×8): 300 mg via ORAL
  Filled 2013-02-02 (×15): qty 1

## 2013-02-02 NOTE — Progress Notes (Signed)
Pt attended AA group 
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
## 2013-02-02 NOTE — Progress Notes (Signed)
D: Pt denies SI/HI/AV. Pt is pleasant and cooperative. Pt rates depression at a 6, anxiety at a 4, and Helplessness/hopelessness at a 8. Pt feels nothing has been going right for him in the last 8 years.  A: Pt was offered support and encouragement. Pt was given scheduled medications. Pt was encourage to attend groups. Q 15 minute checks were done for safety.  R:Pt attends groups and interacts well with peers and staff. Pt taking medication. Pt has no complaints.Pt receptive to treatment and safety maintained on unit.
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
## 2013-02-02 NOTE — Progress Notes (Signed)
D.  Pt. Denies SI/HI and denies A/V hallucinations.  Pt. Ambulating with walker.  No concerns or issues voiced at present.  Denies pain. A.  Pt. Encouraged to attend group.  Q 15 min. Checks continued for safety. R.  Pt. Remains safe and attended group.
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
## 2013-02-03 DIAGNOSIS — F322 Major depressive disorder, single episode, severe without psychotic features: Secondary | ICD-10-CM

## 2013-02-03 NOTE — Progress Notes (Signed)
The focus of this group is to educate the patient on the purpose and policies of crisis stabilization and provide a format to answer questions about their admission.  The group details unit policies and expectations of patients while admitted. Patient reports " Its other people and I cant help what other people do". He reports " what am I supposed to do about being in the cold".

## 2013-02-03 NOTE — Progress Notes (Signed)
Recreation Therapy Notes   Date: 11.18.2014 Time: 3:00pm Location: [address] Dayroom   Group Topic: Communication, Team Building, Problem Solving  Goal Area(s) Addresses:  Patient will effectively work with peer towards shared goal.  Patient will identify skill used to make activity successful.  Patient will identify how skills used during activity can be used to reach post d/c goals.   Behavioral Response: Observation, Appropriate  Intervention: Problem Solving Activitiy  Activity: Life Boat. Patients were given a scenario about being on a sinking yacht. Patients were informed the yacht included 15 guest, 8 of which could be placed on the life boat, along with all group members. Individuals on guest list were of varying socioeconomic classes such as a Education officer, museum, Materials engineer, Midwife, Tree surgeon.   Education: Customer service manager, Discharge Planning   Education Outcome: Acknowledges understanding  Clinical Observations/Feedback: Patient attended group, but assumed role of observer during group activity. Patient made no contributions to group discussion, but appeared to actively listen as he maintained appropriate eye contact with speaker.   Following group discussion patient shared that he did not like the concept of anyone dying as a result of being left on the boat. Patient then shared that he is very giving and always willing to help anyone in need. Patient then transitioned into sharing his has been homeless and living near the [facility] for several years. When peers and LRT offered patient suggestions for living arrangements post d/c patient was not receptive and provided an excuse for each option. LRT challenged patient pointing out that he was not being receptive to suggestions, to which patient reiterated he has been in the "system" for so many years there are no options for him. LRT encouraged patient to work with LCSW during admission, as well as identify what resources he could use  post d/c.   Marykay Lex Javien Tesch, LRT/CTRS  Jearl Klinefelter 02/03/2013 4:43 PM

## 2013-02-03 NOTE — Progress Notes (Signed)
Adult Psychoeducational Group Note  Date:  02/03/2013 Time:  10:55 AM  Group Topic/Focus: Therapeutic activity: sharing strengths with the group.    Participation Level:  Active  Participation Quality:  Appropriate  Affect:  Appropriate  Cognitive:  Appropriate  Insight: Appropriate  Engagement in Group:  Engaged and Improving  Modes of Intervention:  Activity  Additional Comments:  Pt attended therapeutic activity and chose Integrity.He chose integrity because he is an honest person.  Reynolds Bowl 02/03/2013, 10:55 AM

## 2013-02-03 NOTE — [facility] Group Notes (Signed)
[facility] LCSW Group Therapy  02/03/2013  1:15 PM   Type of Therapy:  Group Therapy  Participation Level:  Active  Participation Quality:  Appropriate and Attentive  Affect:  Appropriate  Cognitive:  Alert and Appropriate  Insight:  Developing/Improving and Engaged  Engagement in Therapy:  Developing/Improving and Engaged  Modes of Intervention:  Activity, Clarification, Confrontation, Discussion, Education, Exploration, Limit-setting, Orientation, Problem-solving, Rapport Building, Reality Testing, Socialization and Support  Summary of Progress/Problems: Patient was attentive and engaged with speaker from [employer].  Patient was attentive to speaker while they shared their story of dealing with mental health and overcoming it.  Patient expressed interest in their programs and services and received information on their agency.  Patient processed ways they can relate to the speaker.     Haydon Dorris Horton, LCSW 02/03/2013 1:26 PM   

## 2013-02-03 NOTE — Progress Notes (Signed)
Adult Psychoeducational Group Note  Date:  02/03/2013 Time:  1:41 PM  Group Topic/Focus:  Recovery Goals:   The focus of this group is to identify appropriate goals for recovery and establish a plan to achieve them.  Participation Level:  Minimal  Participation Quality:  Attentive  Affect:  Flat  Cognitive:  Alert  Insight: Lacking  Engagement in Group:  Engaged and Resistant  Modes of Intervention:  Activity, Discussion, Exploration, Socialization and Support  Additional Comments:  Pt came to group and shared that being homeless and addiction are standing between him and recovery. Pt wants to attend therapy and use the resources we give him to find a home. Pt was resistant and not sure where he wants to go after treatment here.   Cathlean Cower 02/03/2013, 1:41 PM

## 2013-02-03 NOTE — Progress Notes (Signed)
D: Patient denies HI and A/V hallucinations and reports on and off thoughts of SI; patient reports sleep is well; reports appetite is good; reports energy level is normal ; reports ability to pay attention is good; rates depression as 6/10; rates hopelessness 8/10; rates anxiety as 8/10; patient is reporting that his anxiety is coming from his current situation mainly due to the fact that his homeless and he is sad  A: Monitored q 15 minutes; patient encouraged to attend groups; patient educated about medications; patient given medications per physician orders; patient encouraged to express feelings and/or concerns  R: Patient is agitated at times; patient can be cooperative but does not have a lot of patience and patient is blaming others and really just focused on his housing situation; the only withdrawal symptoms he reports is agitation and cravings; patient's interaction with staff and peers is minimal and most times it is appropriate; patient was able to set goal to talk with staff 1:1 when having feelings of SI; patient is taking medications as prescribed and tolerating medications; patient is attending all groups but not engaging much and forwards little

## 2013-02-03 NOTE — [facility] Suicide Risk Assessment (Signed)
[facility] INPATIENT:  Family/Significant Other Suicide Prevention Education  Suicide Prevention Education:  Patient Refusal for Family/Significant Other Suicide Prevention Education: The patient [name] [name] has refused to provide written consent for family/significant other to be provided Family/Significant Other Suicide Prevention Education during admission and/or prior to discharge.  Physician notified.  SPE completed with pt. Pt given SPI pamphlet and encouraged to share information with his support network, ask questions, and talk about any concerns.   Smart, Jariya Reichow LCSWA 02/03/2013, 10:35 AM

## 2013-02-03 NOTE — Progress Notes (Signed)
Attended AA 

## 2013-02-03 NOTE — Progress Notes (Signed)
Recreation Therapy Notes  Date: 11.18.2014 Time: 2:30pm Location: [address] Dayroom   Group Topic: Animal Assisted Activities (AAA)  Behavioral Response: Did not attend.   Bernarda Erck L Efstathios Sawin, LRT/CTRS  Daran Favaro L 02/03/2013 5:06 PM 

## 2013-02-03 NOTE — Progress Notes (Signed)
[facility] MD Progress Note  02/03/2013 5:53 PM [name] [name]  MRN:  [id number] Subjective:  Not sure where he will be able to go from here. He would like to go back to [facility] or be able to go to [facility]. He states that there are people trying to help him get an apartment (senior living) he states he would have the funds. He was made aware that he had the funds before but he drank a lot of them. He states he is committed to abstinence. Will like to go to a rehab as states he knows how important it is that he does not relapse. Does needs some structure, some stability.  Diagnosis:   DSM5: Schizophrenia Disorders:  none Obsessive-Compulsive Disorders:  none Trauma-Stressor Disorders:  none Substance/Addictive Disorders:  Alcohol Related Disorder - Severe (303.90) Depressive Disorders:  Major Depressive Disorder - Severe (296.23)  Axis I: Anxiety Disorder NOS  ADL's:  Intact  Sleep: Fair  Appetite:  Fair  Suicidal Ideation:  Plan:  denies Intent:  denies Means:  denies Homicidal Ideation:  Plan:  denies Intent:  denies Means:  denies AEB (as evidenced by):  Psychiatric Specialty Exam: Review of Systems  Constitutional: Negative.   HENT: Negative.   Eyes: Negative.   Cardiovascular: Negative.   Gastrointestinal: Negative.   Genitourinary: Negative.   Musculoskeletal: Positive for back pain.       Leg pain  Skin: Negative.   Neurological: Negative.   Endo/Heme/Allergies: Negative.   Psychiatric/Behavioral: Positive for substance abuse. The patient is nervous/anxious.     Blood pressure 119/74, pulse 78, temperature 97.5 F (36.4 C), temperature source Oral, resp. rate 20, height 5\' 7"  (1.702 m), weight 73.936 kg (163 lb).Body mass index is 25.52 kg/(m^2).  General Appearance: Fairly Groomed, uses a walker to help himself around  [employer]::  Fair  Speech:  Clear and Coherent  Volume:  Normal  Mood:  Anxious and worried  Affect:  Restricted  Thought Process:  Coherent and  Goal Directed  Orientation:  Full (Time, Place, and Person)  Thought Content:  worries, concerns  Suicidal Thoughts:  No  Homicidal Thoughts:  No  Memory:  Immediate;   Fair Recent;   Fair Remote;   Fair  Judgement:  Fair  Insight:  Present and Shallow  Psychomotor Activity:  Restlessness  Concentration:  Fair  Recall:  Fair  Akathisia:  No  Handed:    AIMS (if indicated):     Assets:  Desire for Improvement  Sleep:  Number of Hours: 6.25   Current Medications: Current Facility-Administered Medications  Medication Dose Route Frequency Provider Last Rate Last Dose  . acetaminophen (TYLENOL) tablet 650 mg  650 mg Oral Q6H PRN Verne Spurr, PA-C      . alum & mag hydroxide-simeth (MAALOX/MYLANTA) 200-200-20 MG/5ML suspension 30 mL  30 mL Oral Q4H PRN Verne Spurr, PA-C   30 mL at 01/31/13 0433  . amLODipine (NORVASC) tablet 10 mg  10 mg Oral Daily Nanine Means, NP   10 mg at 02/03/13 0824  . [START ON 02/04/2013] chlordiazePOXIDE (LIBRIUM) capsule 25 mg  25 mg Oral Daily Verne Spurr, PA-C      . citalopram (CELEXA) tablet 20 mg  20 mg Oral Daily Nanine Means, NP   20 mg at 02/03/13 0824  . clopidogrel (PLAVIX) tablet 75 mg  75 mg Oral Q breakfast Nanine Means, NP   75 mg at 02/03/13 0823  . gabapentin (NEURONTIN) capsule 300 mg  300 mg Oral QID Madie Reno  Jorja Loa, MD   300 mg at 02/03/13 1555  . lisinopril (PRINIVIL,ZESTRIL) tablet 10 mg  10 mg Oral Daily Nanine Means, NP   10 mg at 02/03/13 0824  . magnesium hydroxide (MILK OF MAGNESIA) suspension 30 mL  30 mL Oral Daily PRN Verne Spurr, PA-C      . multivitamin with minerals tablet 1 tablet  1 tablet Oral Daily Verne Spurr, PA-C   1 tablet at 02/03/13 0824  . thiamine (B-1) injection 100 mg  100 mg Intramuscular Once [employer], PA-C      . thiamine (VITAMIN B-1) tablet 100 mg  100 mg Oral Daily Verne Spurr, PA-C   100 mg at 02/03/13 4098    Lab Results: No results found for this or any previous visit (from the past 48  hour(s)).  Physical Findings: AIMS: Facial and Oral Movements Muscles of Facial Expression: None, normal Lips and Perioral Area: None, normal Jaw: None, normal Tongue: None, normal,Extremity Movements Upper (arms, wrists, hands, fingers): None, normal Lower (legs, knees, ankles, toes): None, normal, Trunk Movements Neck, shoulders, hips: None, normal, Overall Severity Severity of abnormal movements (highest score from questions above): None, normal Incapacitation due to abnormal movements: None, normal Patient's awareness of abnormal movements (rate only patient's report): No Awareness, Dental Status Current problems with teeth and/or dentures?: Yes Does patient usually wear dentures?: No  CIWA:  CIWA-Ar Total: 2 COWS:     Treatment Plan Summary: Daily contact with patient to assess and evaluate symptoms and progress in treatment Medication management  Plan: Supportive approach/coping skills/relapse prevention           Address placement options             Medical Decision Making Problem Points:  Review of psycho-social stressors (1) Data Points:  Review of medication regiment & side effects (2)  I certify that inpatient services furnished can reasonably be expected to improve the patient's condition.   Katarzyna Wolven A 02/03/2013, 5:53 PM

## 2013-02-03 NOTE — Progress Notes (Signed)
Patient ID: [name] [name], male   DOB: 12-15-1950, 62 y.o.   MRN: [id number] Pt visible in the milieu.  Minimal yet appropriate interaction with staff and peers.  Pt reported he was depressed and anxious because of not having anywhere to go.  Pt stated "I'll either be here or in the the hospital.  I'm not going to stay outside in 15 degree weather.  Support and encouragement given.  Fifteen minute checks in progress. Pt safe on unit.

## 2013-02-04 MED ORDER — GABAPENTIN 400 MG PO CAPS
400.0000 mg | ORAL_CAPSULE | Freq: Four times a day (QID) | ORAL | Status: DC
  Administered 2013-02-04 – 2013-02-06 (×7): 400 mg via ORAL
  Filled 2013-02-04: qty 1
  Filled 2013-02-04: qty 56
  Filled 2013-02-04: qty 1
  Filled 2013-02-04: qty 56
  Filled 2013-02-04 (×4): qty 1
  Filled 2013-02-04: qty 56
  Filled 2013-02-04 (×4): qty 1
  Filled 2013-02-04: qty 56
  Filled 2013-02-04: qty 1
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
## 2013-02-04 NOTE — Progress Notes (Signed)
[facility] MD Progress Note  02/04/2013 3:12 PM [name] [name]  MRN:  [id number] Subjective:  States that he is pretty depressed, agitated, anxious, feels he is in the middle between a rock and a hard place. He is dealing with a lot of uncertainty. Would like to go to a rehab program but it seems that there options are limited Diagnosis:   DSM5: Schizophrenia Disorders:  none Obsessive-Compulsive Disorders:  none Trauma-Stressor Disorders:  none Substance/Addictive Disorders:  Alcohol Related Disorder - Severe (303.90) Depressive Disorders:  Major Depressive Disorder - Moderate (296.22)  Axis I: Anxiety Disorder NOS  ADL's:  Intact  Sleep: Fair  Appetite:  Fair  Suicidal Ideation:  Plan:  denies Intent:  denies Means:  denies Homicidal Ideation:  Plan:  denies Intent:  denies Means:  denies AEB (as evidenced by):  Psychiatric Specialty Exam: Review of Systems  Constitutional: Negative.   HENT: Negative.   Eyes: Negative.   Respiratory: Negative.   Cardiovascular: Negative.   Gastrointestinal: Negative.   Genitourinary: Negative.   Musculoskeletal: Positive for back pain.       Leg, arm pain, neuropathy  Skin: Negative.   Neurological: Negative.   Endo/Heme/Allergies: Negative.   Psychiatric/Behavioral: Positive for depression and suicidal ideas. The patient is nervous/anxious.     Blood pressure 111/73, pulse 94, temperature 97.6 F (36.4 C), temperature source Oral, resp. rate 20, height 5\' 7"  (1.702 m), weight 73.936 kg (163 lb).Body mass index is 25.52 kg/(m^2).  General Appearance: Disheveled  Eye Solicitor::  Fair  Speech:  Clear and Coherent  Volume:  fluctuates  Mood:  Anxious and Depressed  Affect:  anxious, worried  Thought Process:  Coherent and Goal Directed  Orientation:  Full (Time, Place, and Person)  Thought Content:  worreis, concerns, somatically focused  Suicidal Thoughts:  Intermittent when hopeless  Homicidal Thoughts:  No  Memory:   Immediate;   Fair Recent;   Fair Remote;   Fair  Judgement:  Fair  Insight:  Present  Psychomotor Activity:  Restlessness  Concentration:  Fair  Recall:  Fair  Akathisia:  No  Handed:    AIMS (if indicated):     Assets:  Desire for Improvement  Sleep:  Number of Hours: 5.25   Current Medications: Current Facility-Administered Medications  Medication Dose Route Frequency Provider Last Rate Last Dose  . acetaminophen (TYLENOL) tablet 650 mg  650 mg Oral Q6H PRN Verne Spurr, PA-C      . alum & mag hydroxide-simeth (MAALOX/MYLANTA) 200-200-20 MG/5ML suspension 30 mL  30 mL Oral Q4H PRN Verne Spurr, PA-C   30 mL at 01/31/13 0433  . amLODipine (NORVASC) tablet 10 mg  10 mg Oral Daily Nanine Means, NP   10 mg at 02/04/13 0825  . citalopram (CELEXA) tablet 20 mg  20 mg Oral Daily Nanine Means, NP   20 mg at 02/04/13 0824  . clopidogrel (PLAVIX) tablet 75 mg  75 mg Oral Q breakfast Nanine Means, NP   75 mg at 02/04/13 0825  . gabapentin (NEURONTIN) capsule 300 mg  300 mg Oral QID Rachael Fee, MD   300 mg at 02/04/13 1152  . lisinopril (PRINIVIL,ZESTRIL) tablet 10 mg  10 mg Oral Daily Nanine Means, NP   10 mg at 02/04/13 0825  . magnesium hydroxide (MILK OF MAGNESIA) suspension 30 mL  30 mL Oral Daily PRN Verne Spurr, PA-C      . multivitamin with minerals tablet 1 tablet  1 tablet Oral Daily Verne Spurr, PA-C  1 tablet at 02/04/13 0825  . thiamine (B-1) injection 100 mg  100 mg Intramuscular Once [employer], PA-C      . thiamine (VITAMIN B-1) tablet 100 mg  100 mg Oral Daily Verne Spurr, PA-C   100 mg at 02/04/13 2130    Lab Results: No results found for this or any previous visit (from the past 48 hour(s)).  Physical Findings: AIMS: Facial and Oral Movements Muscles of Facial Expression: None, normal Lips and Perioral Area: None, normal Jaw: None, normal Tongue: None, normal,Extremity Movements Upper (arms, wrists, hands, fingers): None, normal Lower (legs, knees,  ankles, toes): None, normal, Trunk Movements Neck, shoulders, hips: None, normal, Overall Severity Severity of abnormal movements (highest score from questions above): None, normal Incapacitation due to abnormal movements: None, normal Patient's awareness of abnormal movements (rate only patient's report): No Awareness, Dental Status Current problems with teeth and/or dentures?: Yes Does patient usually wear dentures?: No  CIWA:  CIWA-Ar Total: 0 COWS:     Treatment Plan Summary: Daily contact with patient to assess and evaluate symptoms and progress in treatment Medication management  Plan: Supportive approach/coping skills/relapse prevention           Increase the Neurontin to 400 mg QID           Explore other placements rehab options Medical Decision Making Problem Points:  Review of psycho-social stressors (1) Data Points:  Review of medication regiment & side effects (2) Review of new medications or change in dosage (2)  I certify that inpatient services furnished can reasonably be expected to improve the patient's condition.   Real Cona A 02/04/2013, 3:12 PM

## 2013-02-04 NOTE — [facility] Group Notes (Signed)
[facility] LCSW Group Therapy  02/04/2013 3:36 PM  Type of Therapy:  Group Therapy  Participation Level:  Minimal  Participation Quality:  Attentive  Affect:  Irritable  Cognitive:  Alert  Insight:  Limited  Engagement in Therapy:  Limited  Modes of Intervention:  Confrontation, Discussion, Education, Exploration, Socialization and Support  Summary of Progress/Problems: Emotion Regulation: This group focused on both positive and negative emotion identification and allowed group members to process ways to identify feelings, regulate negative emotions, and find healthy ways to manage internal/external emotions. Group members were asked to reflect on a time when their reaction to an emotion led to a negative outcome and explored how alternative responses using emotion regulation would have benefited them. Group members were also asked to discuss a time when emotion regulation was utilized when a negative emotion was experienced. [name] [name] was attentive but disengaged throughout today's therapy group. He presented with irritable mood and anxious affect. [name] [name] was resistant to actively participating during group but listened attentively as others shared their emotional struggles with regulation. Group discussion led to AA as a tool for assisting people with gaining support, maintaining sobriety, and thus learning ways to regulate negative emotions in healthier ways. [name] [name] shows limited progress in the group setting and limited insight AEB his inability to point out any positive skills that he received by attending AA or other support groups in the past. [name] [name] continues to point out only the negative aspects of his experiences and of potential coping methods encouraged by other group members. [name] [name] lacks insight regarding his level of control in regards to his emotions, and overall outlook on his situation.     Smart, Trayonna Bachmeier LCSWA  02/04/2013, 3:36 PM

## 2013-02-04 NOTE — Clinical Social Work Note (Signed)
[facility] referral faxed 12:50PM for this pt. Pt agreeable to referral but stated that even if he gets in, he may not go being that he needs to stay in [place] area in order to look at Senior living homes that he applied to. Pt stated that he is primarily waiting for his Social security check to hit his bank account and plans to live in motel until he gets into Senior living home. Pt minimally motivated to go to inpatient treatment. He is primarily focused on finding stable housing.  [employer], LCSWA  02/04/2013 12:54 PM

## 2013-02-04 NOTE — Progress Notes (Signed)
Pt has been up and active in the milieu today. He rated his depression a 5 hopelessness a 7 and anxiety 10+ on his self-inventory.  Has some passive S/I no plan here and contracts to come to staff.  He is looking into [facility] for his follow-up.  Trying to get into some housing when he gets his Social security money.  He denies any H/I A/V/H.

## 2013-02-04 NOTE — [facility] Group Notes (Signed)
Adult Psychoeducational Group Note  Date:  02/04/2013 Time:  9:42 PM  Group Topic/Focus:  AA Meeting  Participation Level:  Active  Participation Quality:  Appropriate  Affect:  Appropriate  Cognitive:  Appropriate  Insight: Appropriate  Engagement in Group:  Engaged  Modes of Intervention:  Discussion and Education  Additional Comments:  Reymundo attended AA group.  Caroll Rancher A 02/04/2013, 9:42 PM

## 2013-02-04 NOTE — Progress Notes (Signed)
Adult Psychoeducational Group Note  Date:  02/04/2013 Time:  1:27 PM  Group Topic/Focus:  Personal Choices and Values:   The focus of this group is to help patients assess and explore the importance of values in their lives, how their values affect their decisions, how they express their values and what opposes their expression.  Participation Level:  Minimal  Participation Quality:  Attentive  Affect:  Appropriate  Cognitive:  Alert  Insight: Lacking  Engagement in Group:  Engaged  Modes of Intervention:  Activity, Discussion, Exploration, Socialization and Support  Additional Comments:  Pt came into group after the activity, but pt did share that he would like to work on the personal value of getting and asking for help and his strongest personal values are being honest and loyal. Pt was encouraged to think about how these personal values influence their life and how to keep evolving to become a person with many positive personal values.    Cathlean Cower 02/04/2013, 1:27 PM

## 2013-02-04 NOTE — Progress Notes (Signed)
Pt reports he is frustrated today as he is not sure what his discharge plans are.  He says a lady came from [place] and had him sign a paper, but he doesn't know what that meant or where he will go at discharge.  He is primarily focused on getting housing and minimizes his drinking.  He denies any withdrawal symptoms at this time.  He denies SI/HI/AV, although he says at times he has fleeting thoughts to kill himself.  He is using his walker for ambulation.  He makes his needs known to staff.  Assured pt that his concerns for discharge would be relayed to the day shift.  Support and encouragement offered.  Medicated as ordered.  Safety maintained with q15 minute checks.

## 2013-02-05 DIAGNOSIS — F320 Major depressive disorder, single episode, mild: Secondary | ICD-10-CM

## 2013-02-05 MED ORDER — NICOTINE POLACRILEX 2 MG MT GUM
2.0000 mg | CHEWING_GUM | OROMUCOSAL | Status: DC | PRN
  Administered 2013-02-05: 2 mg via ORAL
  Filled 2013-02-05: qty 1

## 2013-02-05 NOTE — Progress Notes (Signed)
Pt attended AA group 

## 2013-02-05 NOTE — Clinical Social Work Note (Signed)
[facility] referral manager called CSW at 2:30PM. Pt not accepted into [facility]. Reason being: pt was at this facility two times in past year. CSW and MD met with pt to inform him that he was not accepted into [facility]. CSW provided pt with list of shelters in [place] area including [facility] and emergency shelter information. Pt became agitated and said "WHATEVER." Pt threw information on his bed and sat in his chair. CSW notified pt that he is scheduled for d/c tomorrow.MD made clear with pt that [facility] is not a hotel to wait for his check to come in, but an acute care facility to assist individuals with SA issues/detox and MH crises. Pt acknowledged understanding but continued to be irritable and rude to CSW when reviewing shelter information. Pt to be d/ced tomorrow at 11AM via cab-cab voucher in chart. Unless pt requests a different destination, pt to be driven to [facility] day shelter.     [employer], LCSWA  02/05/2013 3:37 PM

## 2013-02-05 NOTE — Progress Notes (Signed)
Pt. Denies SI/HI and denies A/V hallucinations.  Pt. Encouraged to attend group.  Pt. Did not attend group Reports that he is angry because he is going to be discharged tomorrow.

## 2013-02-05 NOTE — Clinical Social Work Note (Signed)
CSW contacted Referral Manager at [facility] this morning. She reported that they are still reviewing pt's case being that he was admitted twice in 2014 to their facility. Referral Mgr to call back "today or tomorrow morning" with update/decision.  [employer], LCSWA 02/05/2013 11:44 AM

## 2013-02-05 NOTE — Progress Notes (Signed)
Pt rated his depression a 7 hopelessness 9 and anxiety 10+ in his self-inventory.  Pt may have to discharge to shelter for he has been to other places and they are not willing to take him back for instance [facility]. He denies any S/H ideation or A/V/H.  He was wanting to stay here at [facility] until his check arrives.  He stated,"I will leave here and go straight back to emergency room.

## 2013-02-05 NOTE — Progress Notes (Signed)
Adult Psychoeducational Group Note  Date:  02/05/2013 Time:  11:55 AM  Group Topic/Focus:  Building Self Esteem:   The Focus of this group is helping patients become aware of the effects of self-esteem on their lives, the things they and others do that enhance or undermine their self-esteem, seeing the relationship between their level of self-esteem and the choices they make and learning ways to enhance self-esteem.  Participation Level:  Active  Participation Quality:  Attentive  Affect:  Flat  Cognitive:  Alert  Insight: Good  Engagement in Group:  Engaged  Modes of Intervention:  Activity, Discussion, Exploration, Socialization and Support  Additional Comments:  Pt came to group and shared that drug and alcohol use and the loss of everything in his life are negatively effecting his self-esteem. Pt plans on changing this by loving himself and being surrounded by good people.   Cathlean Cower 02/05/2013, 11:55 AM

## 2013-02-05 NOTE — Progress Notes (Signed)
[facility] MD Progress Note  02/05/2013 3:19 PM Cliff Damiani  MRN:  [id number] Subjective:  [name] [name] was not accepted to go to [facility] as he has been there couple of times this year. He is not interested in participating of the groups or any of the therapeutic activities in the unit. Seems to be here just waiting to get his money so he can get a motel while waiting for the people who are trying to help him to get an apartment "senior living." He exhibits no acute withdrawal Diagnosis:   DSM5: Schizophrenia Disorders:  none Obsessive-Compulsive Disorders:  none Trauma-Stressor Disorders:  none Substance/Addictive Disorders:  Alcohol Related Disorder - Severe (303.90) Depressive Disorders:  Major Depressive Disorder - Mild (296.21)  Axis I: Substance Induced Mood Disorder  ADL's:  Intact  Sleep: Fair  Appetite:  Fair  Suicidal Ideation:  Plan:  denies Intent:  denies Means:  denies Homicidal Ideation:  Plan:  denies Intent:  denies Means:  denies AEB (as evidenced by):  Psychiatric Specialty Exam: Review of Systems  Constitutional: Negative.   HENT: Negative.   Eyes: Negative.   Respiratory: Negative.   Cardiovascular: Negative.   Gastrointestinal: Negative.   Genitourinary: Negative.   Musculoskeletal:       Leg pain  Skin: Negative.   Neurological: Negative.   Endo/Heme/Allergies: Negative.   Psychiatric/Behavioral: Positive for substance abuse.    Blood pressure 116/83, pulse 104, temperature 96.5 F (35.8 C), temperature source Oral, resp. rate 20, height 5\' 7"  (1.702 m), weight 73.936 kg (163 lb).Body mass index is 25.52 kg/(m^2).  General Appearance: Fairly Groomed  Patent attorney::  Fair  Speech:  Clear and Coherent  Volume:  Normal  Mood:  Irritable  Affect:  Restricted  Thought Process:  Coherent and Goal Directed  Orientation:  Full (Time, Place, and Person)  Thought Content:  worries, concerns  Suicidal Thoughts:  No  Homicidal Thoughts:  No  Memory:   Immediate;   Fair Recent;   Fair Remote;   Fair  Judgement:  Fair  Insight:  Present and Shallow  Psychomotor Activity:  Restlessness  Concentration:  Fair  Recall:  Fair  Akathisia:  No  Handed:    AIMS (if indicated):     Assets:  Desire for Improvement  Sleep:  Number of Hours: 6   Current Medications: Current Facility-Administered Medications  Medication Dose Route Frequency Provider Last Rate Last Dose  . acetaminophen (TYLENOL) tablet 650 mg  650 mg Oral Q6H PRN Verne Spurr, PA-C      . alum & mag hydroxide-simeth (MAALOX/MYLANTA) 200-200-20 MG/5ML suspension 30 mL  30 mL Oral Q4H PRN Verne Spurr, PA-C   30 mL at 01/31/13 0433  . amLODipine (NORVASC) tablet 10 mg  10 mg Oral Daily Nanine Means, NP   10 mg at 02/05/13 8295  . citalopram (CELEXA) tablet 20 mg  20 mg Oral Daily Nanine Means, NP   20 mg at 02/05/13 6213  . clopidogrel (PLAVIX) tablet 75 mg  75 mg Oral Q breakfast Nanine Means, NP   75 mg at 02/05/13 0865  . gabapentin (NEURONTIN) capsule 400 mg  400 mg Oral QID Rachael Fee, MD   400 mg at 02/05/13 1153  . lisinopril (PRINIVIL,ZESTRIL) tablet 10 mg  10 mg Oral Daily Nanine Means, NP   10 mg at 02/05/13 7846  . magnesium hydroxide (MILK OF MAGNESIA) suspension 30 mL  30 mL Oral Daily PRN Verne Spurr, PA-C      . multivitamin with minerals  tablet 1 tablet  1 tablet Oral Daily Verne Spurr, PA-C   1 tablet at 02/05/13 [phone]  . thiamine (B-1) injection 100 mg  100 mg Intramuscular Once [employer], PA-C      . thiamine (VITAMIN B-1) tablet 100 mg  100 mg Oral Daily Verne Spurr, PA-C   100 mg at 02/05/13 1914    Lab Results: No results found for this or any previous visit (from the past 48 hour(s)).  Physical Findings: AIMS: Facial and Oral Movements Muscles of Facial Expression: None, normal Lips and Perioral Area: None, normal Jaw: None, normal Tongue: None, normal,Extremity Movements Upper (arms, wrists, hands, fingers): None, normal Lower (legs,  knees, ankles, toes): None, normal, Trunk Movements Neck, shoulders, hips: None, normal, Overall Severity Severity of abnormal movements (highest score from questions above): None, normal Incapacitation due to abnormal movements: None, normal Patient's awareness of abnormal movements (rate only patient's report): No Awareness, Dental Status Current problems with teeth and/or dentures?: Yes Does patient usually wear dentures?: No  CIWA:  CIWA-Ar Total: 1 COWS:     Treatment Plan Summary: Daily contact with patient to assess and evaluate symptoms and progress in treatment Medication management  Plan: Supportive approach/coping skills/relapse prevention           Facilitate temporary placement/shelter while he waits to get his check  Medical Decision Making Problem Points:  Review of psycho-social stressors (1) Data Points:  Review of medication regiment & side effects (2)  I certify that inpatient services furnished can reasonably be expected to improve the patient's condition.   Tayveon Lombardo A 02/05/2013, 3:19 PM

## 2013-02-05 NOTE — Progress Notes (Signed)
Recreation Therapy Notes  Date: 11.20.2014 Time: 2:30pm Location: [address] Dayroom   Group Topic: Software engineer Activities (AAA)  Behavioral Response: Engaged, Attentive  Affect: Euthymic  Clinical Observations/Feedback: Dog Team: [employer] Patient interacted appropriately with peer, dog team, and LRT.   Marykay Lex Lashawn Bromwell, LRT/CTRS  Jearl Klinefelter 02/05/2013 5:21 PM

## 2013-02-05 NOTE — [facility] Group Notes (Signed)
[facility] LCSW Group Therapy  02/05/2013 2:28 PM  Type of Therapy:  Group Therapy  Participation Level:  Active  Participation Quality:  Monopolizing  Affect:  Irritable  Cognitive:  Lacking  Insight:  Limited  Engagement in Therapy:  Limited  Modes of Intervention:  Confrontation, Discussion, Education, Exploration, Socialization and Support  Summary of Progress/Problems:  Finding Balance in Life. Today's group focused on defining balance in one's own words, identifying things that can knock one off balance, and exploring healthy ways to maintain balance in life. Group members were asked to provide an example of a time when they felt off balance, describe how they handled that situation,and process healthier ways to regain balance in the future. Group members were asked to share the most important tool for maintaining balance that they learned while at [facility] and how they plan to apply this method after discharge. [name] [name] was attentive and engaged throughout today's therapy group. [name] [name] stated that he would be in balance if he had a place to live and his "check money." [name] [name] demonstrates limited insight AEB his minimalizing of his alcoholism, stating that he is only in the hospital because of "a medical issue." [name] [name] stated that he plans to get his money, get into a senior living home, and explains that he will be in balance after this. [name] [name] was encouraged to explore what he plans to do with himself once he is in a stable living environment. "I plan to go back to school and become a paralegal. 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 I can argue with superior court judges and win." [name] [name] does not appear to be making progress in the group setting at this time. He tends to monopolize conversation, is resistant to redirection, presents with irritable mood and agitated affect. Pt stated that he does not find the topic of balance helpful. "things are not that simple." He was heavily focused on the wrongdoings of  others and how his money coming in is all that is keeping him from leading a balanced life. Pt was confronted about what happened following his last stay at [facility] used his money to buy alcohol, relapsed and lived in a motel until he went to [facility] and got "kicked out." Pt unable to process how his alcoholism contributes to his imbalance and current mental/physical/financial position.    [name] [name], [name] [name] 02/05/2013, 2:28 PM

## 2013-02-06 MED ORDER — CITALOPRAM HYDROBROMIDE 20 MG PO TABS: 20.0000 mg | ORAL_TABLET | Freq: Every day | ORAL | Status: DC

## 2013-02-06 MED ORDER — LISINOPRIL 10 MG PO TABS: 10.0000 mg | ORAL_TABLET | Freq: Every day | ORAL | Status: DC

## 2013-02-06 MED ORDER — AMLODIPINE BESYLATE 10 MG PO TABS: 10.0000 mg | ORAL_TABLET | Freq: Every day | ORAL | Status: DC

## 2013-02-06 MED ORDER — TRAZODONE HCL 50 MG PO TABS
50.0000 mg | ORAL_TABLET | Freq: Every evening | ORAL | Status: DC | PRN
  Filled 2013-02-06 (×2): qty 28

## 2013-02-06 MED ORDER — CLOPIDOGREL BISULFATE 75 MG PO TABS: 75.0000 mg | ORAL_TABLET | Freq: Every day | ORAL | Status: DC

## 2013-02-06 MED ORDER — GABAPENTIN 400 MG PO CAPS: 400.0000 mg | ORAL_CAPSULE | Freq: Four times a day (QID) | ORAL | Status: DC

## 2013-02-06 NOTE — Progress Notes (Signed)
Pt d/c from hospital with taxi voucher to [facility] and two bus passes. Pt refused to sign d/c papers and reports "other people get to stay here longer than me." D/C instructions given, prescriptions given and samples given. Pt denies si and hi. Pt d/c belongings and information given by Clinical research associate and AC.

## 2013-02-06 NOTE — Progress Notes (Addendum)
[facility] Adult Case Management Discharge Plan :  Will you be returning to the same living situation after discharge: No. Pt has bed available at the [facility].  At discharge, do you have transportation home?:Yes,  cab voucher AND bus pass given Do you have the ability to pay for your medications:Yes,  mental health  Releases signed and turned into medical records.  Patient to Follow up at: Follow-up Information   Follow up with [facility]. (Walk in between 8am-9am Monday through Friday for hospital follow-up/medication management/assessment for therapy services. )    Contact information:   [address], [place] [address] Phone: [phone] Fax: [phone]      Patient denies SI/HI:   Yes,  during group/self report.    Safety Planning and Suicide Prevention discussed:  Yes,  PT refused to consent to family contact. SPE completed with pt and he was encouraged to share information with support network, talk about concerns, and ask questions.   Smart, Shenee Wignall 02/06/2013, 10:57 AM

## 2013-02-06 NOTE — Progress Notes (Signed)
Patient did not attend the evening karaoke group. Pt remained in his room during group time.   

## 2013-02-06 NOTE — [facility] Suicide Risk Assessment (Signed)
Suicide Risk Assessment  Discharge Assessment     Demographic Factors:  Male and Caucasian  Mental Status Per Nursing Assessment::   On Admission:     Current Mental Status by Physician: In full contact with reality. There are no suicidal ideas, plans or intent. There is no acute withdrawal. He is going to the [facility] today and procure a shelter bed for the next several days until he gets his check so he can get some other temporay  placement. He is working with community resources to get a more permanent placement. He is pretty much aware that he will be in the same situation again if he was to relapse and use his retirement money to buy alcohol   Loss Factors: Decline in physical health  Historical Factors: NA  Risk Reduction Factors:   resourceful  Continued Clinical Symptoms:  Alcohol/Substance Abuse/Dependencies  Cognitive Features That Contribute To Risk:  Closed-mindedness Polarized thinking Thought constriction (tunnel vision)    Suicide Risk:  Minimal: No identifiable suicidal ideation.  Patients presenting with no risk factors but with morbid ruminations; may be classified as minimal risk based on the severity of the depressive symptoms  Discharge Diagnoses:   AXIS I:  Alcohol Dependence, Depressive Disorder NOS AXIS II:  Deferred AXIS III:   Past Medical History  Diagnosis Date  . Hypercholesteremia   . Stroke   . Hypertension   . Alcohol abuse   . Seizures   . Depression   . Homelessness    AXIS IV:  housing problems AXIS V:  61-70 mild symptoms  Plan Of Care/Follow-up recommendations:  Activity:  as tolerated Diet:  regular Follow up with the outpatient providers/community resources/work the relapse prevention plan/go to AA Is patient on multiple antipsychotic therapies at discharge:  No   Has Patient had three or more failed trials of antipsychotic monotherapy by history:  No  Recommended Plan for Multiple Antipsychotic Therapies: NA  Arlet Marter  A 02/06/2013, 8:33 AM

## 2013-02-06 NOTE — Plan of Care (Signed)
Problem: Alteration in mood & ability to function due to Goal: LTG-Pt reports reduction in suicidal thoughts (Patient reports reduction in suicidal thoughts and is able to verbalize a safety plan for whenever patient is feeling suicidal)  Outcome: Completed/Met Date Met:  02/06/13 Pt denies si and hi Goal: STG-Patient will report withdrawal symptoms Outcome: Completed/Met Date Met:  02/06/13 No withdrawal symptoms  Problem: Alteration in mood Goal: LTG-Patient reports reduction in suicidal thoughts (Patient reports reduction in suicidal thoughts and is able to verbalize a safety plan for whenever patient is feeling suicidal)  Outcome: Completed/Met Date Met:  02/06/13 Pt denies si  Goal: STG-Patient reports thoughts of self-harm to staff Outcome: Completed/Met Date Met:  02/06/13 Pt denies si Goal: STG-Pt Able to Identify Plan For Continuing Care at D/C Pt. Will be able to identify a plan for continuing care at discharge  Outcome: Completed/Met Date Met:  02/06/13 Pt reports going to [employer]

## 2013-02-06 NOTE — Tx Team (Signed)
Interdisciplinary Treatment Plan Update (Adult)  Date: 02/06/2013   Time Reviewed: 10:43 AM  Progress in Treatment:  Attending groups: Yes  Participating in groups:  Yes, Monopolizing and resistant to redirection at this time.  Taking medication as prescribed: Yes  Tolerating medication: Yes  Family/Significant othe contact made: No. Pt refused to consent to family contact. SPE completed with pt.   Patient understands diagnosis: Yes, AEB seeking treatment for ETOH detox, mood stabilization, and passive SI.  Discussing patient identified problems/goals with staff: Yes  Medical problems stabilized or resolved: Yes  Denies suicidal/homicidal ideation: Yes during group/self report.  Patient has not harmed self or Others: Yes  New problem(s) identified: n/a  Discharge Plan or Barriers: Pt not accepted into [facility] or [facility]. Pt made it clear that he is soley focused on receiving social security check in a few days. Pt being d/ced to [employer] available. He will take cab to [facility] for day shelter and was provided with bus pass to get to [place] house. Pt to follow up at [facility] for med management.  Additional comments: n/a  Reason for Continuation of Hospitalization: D/c today Estimated length of stay: d/c today For review of initial/current patient goals, please see plan of care.  Attendees:  Patient:    Family:    Physician: Geoffery Lyons MD 02/06/2013 10:43 AM   Nursing: Vanessa Kick RN  02/06/2013 10:43 AM   Clinical Social Worker Tyner Codner Smart, LCSWA  02/06/2013 10:43 AM   Other: Philippa Chester RN 02/06/2013 10:43 AM   Other:    Other: Darden Dates Nurse CM  02/06/2013 10:43 AM   Other:    Scribe for Treatment Team:  [employer] LCSWA 02/06/2013 10:43 AM
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
## 2013-02-06 NOTE — B[facility] Group Notes (Signed)
[facility] LCSW Aftercare Discharge Planning Group Note   02/06/2013 9:56 AM  Participation Quality:  Monopolizing   Mood/Affect:  Irritable  Depression Rating:  8  Anxiety Rating:  8  Thoughts of Suicide:  No Will you contract for safety?   NA  Current AVH:  No  Plan for Discharge/Comments:  Pt reports that he did not call any of the numbers (shelter contacts) that he was given by CSW yesterday because "I'm pretty sure I had an issue with most of them and would not get in." CSW reiterated that pt is scheduled for d/c today regardless. Pt provided with [facility] information and encouraged to go there after d/c today being that they have a day shelter and can connect him to other services. Pt still heavily focused on when his money will come in and stated that his walker is still at [facility] along with other belongings. Pt asked if he would prefer to go to [facility] to pick up his belongings. He stated no but asked if he could keep the walker provided to him while at [facility]. CSW explained that this was for hospital use only but would touch base with MD about any options. Pt requested two bus passes in addition to cab voucher.   Transportation Means: cab  Supports: none identified by pt. Pt was set up with Care Coordination through [facility] while at [facility] but states that he does not understand this service. Pt provided with contact info to Care Coordination in order to call and inquire about services available.   Smart, [employer]

## 2013-02-10 NOTE — Progress Notes (Signed)
Patient Discharge Instructions:  After Visit Summary (AVS):   Faxed to:  02/10/13 Discharge Summary Note:   Faxed to:  02/10/13 Psychiatric Admission Assessment Note:   Faxed to:  02/10/13 Suicide Risk Assessment - Discharge Assessment:   Faxed to:  02/10/13 Faxed/Sent to the Next Level Care provider:  02/10/13 Faxed to [facility] @ [phone]  Jerelene Redden, 02/10/2013, 3:26 PM

## 2013-02-20 ENCOUNTER — Inpatient Hospital Stay (HOSPITAL_COMMUNITY)
Admission: EM | Admit: 2013-02-20 | Discharge: 2013-02-25 | DRG: 885 | Disposition: A | Discharge status: Home / Self Care | Payer: Federal, State, Local not specified - Other | Source: Intra-hospital | Attending: Psychiatry | Admitting: Psychiatry

## 2013-02-20 ENCOUNTER — Encounter (HOSPITAL_COMMUNITY): Payer: Self-pay | Admitting: Emergency Medicine

## 2013-02-20 ENCOUNTER — Emergency Department (HOSPITAL_COMMUNITY)
Admission: EM | Admit: 2013-02-20 | Discharge: 2013-02-20 | Disposition: A | Discharge status: Other Acute Inpatient Hospital | Payer: No Typology Code available for payment source | Attending: Emergency Medicine | Admitting: Emergency Medicine

## 2013-02-20 ENCOUNTER — Encounter (HOSPITAL_COMMUNITY): Payer: Self-pay | Admitting: Behavioral Health

## 2013-02-20 DIAGNOSIS — R569 Unspecified convulsions: Secondary | ICD-10-CM

## 2013-02-20 DIAGNOSIS — Z8639 Personal history of other endocrine, nutritional and metabolic disease: Secondary | ICD-10-CM | POA: Insufficient documentation

## 2013-02-20 DIAGNOSIS — Z862 Personal history of diseases of the blood and blood-forming organs and certain disorders involving the immune mechanism: Secondary | ICD-10-CM | POA: Insufficient documentation

## 2013-02-20 DIAGNOSIS — F39 Unspecified mood [affective] disorder: Secondary | ICD-10-CM | POA: Diagnosis present

## 2013-02-20 DIAGNOSIS — Z8673 Personal history of transient ischemic attack (TIA), and cerebral infarction without residual deficits: Secondary | ICD-10-CM | POA: Insufficient documentation

## 2013-02-20 DIAGNOSIS — F329 Major depressive disorder, single episode, unspecified: Secondary | ICD-10-CM | POA: Insufficient documentation

## 2013-02-20 DIAGNOSIS — Z5900 Homelessness unspecified: Secondary | ICD-10-CM | POA: Insufficient documentation

## 2013-02-20 DIAGNOSIS — R45851 Suicidal ideations: Secondary | ICD-10-CM

## 2013-02-20 DIAGNOSIS — G589 Mononeuropathy, unspecified: Secondary | ICD-10-CM | POA: Diagnosis present

## 2013-02-20 DIAGNOSIS — G40901 Epilepsy, unspecified, not intractable, with status epilepticus: Secondary | ICD-10-CM

## 2013-02-20 DIAGNOSIS — I10 Essential (primary) hypertension: Secondary | ICD-10-CM | POA: Insufficient documentation

## 2013-02-20 DIAGNOSIS — F17200 Nicotine dependence, unspecified, uncomplicated: Secondary | ICD-10-CM | POA: Insufficient documentation

## 2013-02-20 DIAGNOSIS — F10220 Alcohol dependence with intoxication, uncomplicated: Secondary | ICD-10-CM

## 2013-02-20 DIAGNOSIS — F332 Major depressive disorder, recurrent severe without psychotic features: Principal | ICD-10-CM | POA: Diagnosis present

## 2013-02-20 DIAGNOSIS — F1020 Alcohol dependence, uncomplicated: Secondary | ICD-10-CM | POA: Diagnosis present

## 2013-02-20 DIAGNOSIS — F3289 Other specified depressive episodes: Secondary | ICD-10-CM | POA: Insufficient documentation

## 2013-02-20 DIAGNOSIS — G40909 Epilepsy, unspecified, not intractable, without status epilepticus: Secondary | ICD-10-CM | POA: Insufficient documentation

## 2013-02-20 DIAGNOSIS — F10939 Alcohol use, unspecified with withdrawal, unspecified: Secondary | ICD-10-CM | POA: Diagnosis present

## 2013-02-20 DIAGNOSIS — I6350 Cerebral infarction due to unspecified occlusion or stenosis of unspecified cerebral artery: Secondary | ICD-10-CM

## 2013-02-20 DIAGNOSIS — Z79899 Other long term (current) drug therapy: Secondary | ICD-10-CM

## 2013-02-20 DIAGNOSIS — R209 Unspecified disturbances of skin sensation: Secondary | ICD-10-CM | POA: Diagnosis present

## 2013-02-20 DIAGNOSIS — E7800 Pure hypercholesterolemia, unspecified: Secondary | ICD-10-CM | POA: Diagnosis present

## 2013-02-20 DIAGNOSIS — F10239 Alcohol dependence with withdrawal, unspecified: Secondary | ICD-10-CM | POA: Diagnosis present

## 2013-02-20 DIAGNOSIS — F1010 Alcohol abuse, uncomplicated: Secondary | ICD-10-CM | POA: Insufficient documentation

## 2013-02-20 DIAGNOSIS — Z7902 Long term (current) use of antithrombotics/antiplatelets: Secondary | ICD-10-CM | POA: Insufficient documentation

## 2013-02-20 LAB — CBC WITH DIFFERENTIAL/PLATELET
Basophils Absolute: 0 10*3/uL (ref 0.0–0.1)
Basophils Relative: 0 % (ref 0–1)
Eosinophils Absolute: 0.2 10*3/uL (ref 0.0–0.7)
Eosinophils Relative: 4 % (ref 0–5)
Lymphocytes Relative: 24 % (ref 12–46)
MCH: 33.8 pg (ref 26.0–34.0)
MCHC: 34.5 g/dL (ref 30.0–36.0)
MCV: 98 fL (ref 78.0–100.0)
Monocytes Absolute: 0.3 10*3/uL (ref 0.1–1.0)
Neutrophils Relative %: 67 % (ref 43–77)
Platelets: 139 10*3/uL — ABNORMAL LOW (ref 150–400)
RBC: 3.96 MIL/uL — ABNORMAL LOW (ref 4.22–5.81)
RDW: 14.8 % (ref 11.5–15.5)
WBC: 5.4 10*3/uL (ref 4.0–10.5)

## 2013-02-20 LAB — ACETAMINOPHEN LEVEL: Acetaminophen (Tylenol), Serum: <15 ug/mL (ref 10–30)

## 2013-02-20 LAB — COMPREHENSIVE METABOLIC PANEL
ALT: 32 U/L (ref 0–53)
AST: 51 U/L — ABNORMAL HIGH (ref 0–37)
CO2: 20 mEq/L (ref 19–32)
Calcium: 9.5 mg/dL (ref 8.4–10.5)
Creatinine, Ser: 0.78 mg/dL (ref 0.50–1.35)
Potassium: 3.5 mEq/L (ref 3.5–5.1)
Sodium: 131 mEq/L — ABNORMAL LOW (ref 135–145)
Total Protein: 8 g/dL (ref 6.0–8.3)

## 2013-02-20 LAB — ETHANOL: Alcohol, Ethyl (B): 266 mg/dL — ABNORMAL HIGH (ref 0–11)

## 2013-02-20 LAB — RAPID URINE DRUG SCREEN, HOSP PERFORMED
Amphetamines: NOT DETECTED
Benzodiazepines: NOT DETECTED
Cocaine: NOT DETECTED
Opiates: NOT DETECTED

## 2013-02-20 MED ORDER — ALUM & MAG HYDROXIDE-SIMETH 200-200-20 MG/5ML PO SUSP: 30.0000 mL | ORAL | Status: DC | PRN

## 2013-02-20 MED ORDER — AMLODIPINE BESYLATE 10 MG PO TABS
10.0000 mg | ORAL_TABLET | Freq: Every day | ORAL | Status: DC
  Administered 2013-02-20: 10 mg via ORAL
  Filled 2013-02-20 (×2): qty 1

## 2013-02-20 MED ORDER — CHLORDIAZEPOXIDE HCL 25 MG PO CAPS
50.0000 mg | ORAL_CAPSULE | Freq: Once | ORAL | Status: AC
  Administered 2013-02-20: 50 mg via ORAL
  Filled 2013-02-20: qty 2

## 2013-02-20 MED ORDER — THIAMINE HCL 100 MG/ML IJ SOLN
100.0000 mg | Freq: Every day | INTRAMUSCULAR | Status: DC
  Filled 2013-02-20: qty 2

## 2013-02-20 MED ORDER — CHLORDIAZEPOXIDE HCL 25 MG PO CAPS
25.0000 mg | ORAL_CAPSULE | Freq: Every day | ORAL | Status: AC
Start: 2013-02-24 — End: 2013-02-23
  Administered 2013-02-23: 25 mg via ORAL

## 2013-02-20 MED ORDER — CHLORDIAZEPOXIDE HCL 25 MG PO CAPS
25.0000 mg | ORAL_CAPSULE | Freq: Four times a day (QID) | ORAL | Status: AC | PRN
  Administered 2013-02-22: 25 mg via ORAL
  Filled 2013-02-20: qty 1

## 2013-02-20 MED ORDER — HYDROXYZINE HCL 25 MG PO TABS: 25.0000 mg | ORAL_TABLET | Freq: Four times a day (QID) | ORAL | Status: AC | PRN

## 2013-02-20 MED ORDER — LOPERAMIDE HCL 2 MG PO CAPS: 2.0000 mg | ORAL_CAPSULE | ORAL | Status: AC | PRN

## 2013-02-20 MED ORDER — LORAZEPAM 1 MG PO TABS: 0.0000 mg | ORAL_TABLET | Freq: Two times a day (BID) | ORAL | Status: DC

## 2013-02-20 MED ORDER — LORAZEPAM 1 MG PO TABS: 0.0000 mg | ORAL_TABLET | Freq: Four times a day (QID) | ORAL | Status: DC

## 2013-02-20 MED ORDER — AMLODIPINE BESYLATE 10 MG PO TABS
10.0000 mg | ORAL_TABLET | Freq: Every day | ORAL | Status: DC
  Administered 2013-02-21 – 2013-02-25 (×5): 10 mg via ORAL
  Filled 2013-02-20 (×7): qty 1
  Filled 2013-02-20: qty 14

## 2013-02-20 MED ORDER — TRAZODONE HCL 50 MG PO TABS
50.0000 mg | ORAL_TABLET | Freq: Every evening | ORAL | Status: DC | PRN
  Filled 2013-02-20: qty 14
  Filled 2013-02-20: qty 1

## 2013-02-20 MED ORDER — CITALOPRAM HYDROBROMIDE 20 MG PO TABS
20.0000 mg | ORAL_TABLET | Freq: Every day | ORAL | Status: DC
  Administered 2013-02-20: 20 mg via ORAL
  Filled 2013-02-20 (×2): qty 1

## 2013-02-20 MED ORDER — VITAMIN B-1 100 MG PO TABS
100.0000 mg | ORAL_TABLET | Freq: Every day | ORAL | Status: DC
  Administered 2013-02-21 – 2013-02-25 (×5): 100 mg via ORAL
  Filled 2013-02-20 (×7): qty 1

## 2013-02-20 MED ORDER — ADULT MULTIVITAMIN W/MINERALS CH
1.0000 | ORAL_TABLET | Freq: Every day | ORAL | Status: DC
  Administered 2013-02-20 – 2013-02-25 (×6): 1 via ORAL
  Filled 2013-02-20 (×9): qty 1

## 2013-02-20 MED ORDER — CHLORDIAZEPOXIDE HCL 25 MG PO CAPS
25.0000 mg | ORAL_CAPSULE | Freq: Three times a day (TID) | ORAL | Status: AC
  Administered 2013-02-22 (×3): 25 mg via ORAL
  Filled 2013-02-20 (×3): qty 1

## 2013-02-20 MED ORDER — CHLORDIAZEPOXIDE HCL 25 MG PO CAPS
25.0000 mg | ORAL_CAPSULE | Freq: Four times a day (QID) | ORAL | Status: AC
  Administered 2013-02-20 – 2013-02-21 (×6): 25 mg via ORAL
  Filled 2013-02-20 (×6): qty 1

## 2013-02-20 MED ORDER — LISINOPRIL 10 MG PO TABS
10.0000 mg | ORAL_TABLET | Freq: Every day | ORAL | Status: DC
  Administered 2013-02-20: 10 mg via ORAL
  Filled 2013-02-20 (×2): qty 1

## 2013-02-20 MED ORDER — CITALOPRAM HYDROBROMIDE 20 MG PO TABS
20.0000 mg | ORAL_TABLET | Freq: Every day | ORAL | Status: DC
  Administered 2013-02-21 – 2013-02-25 (×5): 20 mg via ORAL
  Filled 2013-02-20 (×2): qty 1
  Filled 2013-02-20: qty 14
  Filled 2013-02-20 (×5): qty 1

## 2013-02-20 MED ORDER — MAGNESIUM HYDROXIDE 400 MG/5ML PO SUSP: 30.0000 mL | Freq: Every day | ORAL | Status: DC | PRN

## 2013-02-20 MED ORDER — ACETAMINOPHEN 325 MG PO TABS
650.0000 mg | ORAL_TABLET | Freq: Four times a day (QID) | ORAL | Status: DC | PRN
  Administered 2013-02-23 (×2): 650 mg via ORAL
  Filled 2013-02-20 (×2): qty 2

## 2013-02-20 MED ORDER — VITAMIN B-1 100 MG PO TABS
100.0000 mg | ORAL_TABLET | Freq: Every day | ORAL | Status: DC
  Administered 2013-02-20: 10:00:00 100 mg via ORAL
  Filled 2013-02-20: qty 1

## 2013-02-20 MED ORDER — CLOPIDOGREL BISULFATE 75 MG PO TABS
75.0000 mg | ORAL_TABLET | Freq: Every day | ORAL | Status: DC
  Administered 2013-02-20: 75 mg via ORAL
  Filled 2013-02-20 (×3): qty 1

## 2013-02-20 MED ORDER — GABAPENTIN 400 MG PO CAPS
400.0000 mg | ORAL_CAPSULE | Freq: Four times a day (QID) | ORAL | Status: DC
  Administered 2013-02-20: 400 mg via ORAL
  Filled 2013-02-20 (×5): qty 1

## 2013-02-20 MED ORDER — CLOPIDOGREL BISULFATE 75 MG PO TABS
75.0000 mg | ORAL_TABLET | Freq: Every day | ORAL | Status: DC
  Administered 2013-02-21 – 2013-02-25 (×5): 75 mg via ORAL
  Filled 2013-02-20 (×3): qty 1
  Filled 2013-02-20: qty 14
  Filled 2013-02-20 (×3): qty 1

## 2013-02-20 MED ORDER — GABAPENTIN 400 MG PO CAPS
400.0000 mg | ORAL_CAPSULE | Freq: Four times a day (QID) | ORAL | Status: DC
  Administered 2013-02-20 – 2013-02-22 (×7): 400 mg via ORAL
  Filled 2013-02-20 (×11): qty 1

## 2013-02-20 MED ORDER — ONDANSETRON 4 MG PO TBDP: 4.0000 mg | ORAL_TABLET | Freq: Four times a day (QID) | ORAL | Status: AC | PRN

## 2013-02-20 MED ORDER — CHLORDIAZEPOXIDE HCL 25 MG PO CAPS
25.0000 mg | ORAL_CAPSULE | ORAL | Status: AC
  Administered 2013-02-23 (×2): 25 mg via ORAL
  Filled 2013-02-20 (×2): qty 1

## 2013-02-20 MED ORDER — LISINOPRIL 10 MG PO TABS
10.0000 mg | ORAL_TABLET | Freq: Every day | ORAL | Status: DC
  Administered 2013-02-21 – 2013-02-25 (×5): 10 mg via ORAL
  Filled 2013-02-20 (×7): qty 1
  Filled 2013-02-20: qty 14

## 2013-02-20 MED ORDER — THIAMINE HCL 100 MG/ML IJ SOLN: 100.0000 mg | Freq: Once | INTRAMUSCULAR | Status: DC

## 2013-02-20 NOTE — Progress Notes (Signed)
Patient ID: [name] [name], male   DOB: Jun 09, 1950, 62 y.o.   MRN: [id number] This is a 62 year old male admitted for suicidal ideations and ETOH detox. Pt's mother died recently and his mood has been declining ever since, although he has no contact with other family members. Pt is homeless due to chronic alcoholism. Pt mood is depressed/irritable and his affect is sad. Pt is a high fall risk due to a hx of repeated falls in and out of the hospital setting. Writer encouraged pt to utilize a shower chair or the walk-in tub on the unit for bathing to avoid falls. Pt is dependent on a wheel chair for mobility and may need assistance or supervision when changing position. Writer oriented pt to the milieu and food was provided. 15 minute checks intiated for safety.

## 2013-02-20 NOTE — Consult Note (Signed)
Psychiatric Specialty Exam: Physical Exam  ROS  There were no vitals taken for this visit.There is no weight on file to calculate BMI.  General Appearance: Casual and Disheveled  Eye Contact::  Good  Speech:  Clear and Coherent and Normal Rate  Volume:  Normal  Mood:  Depressed  Affect:  Congruent and Depressed  Thought Process:  Coherent, Goal Directed and Intact  Orientation:  Full (Time, Place, and Person)  Thought Content:  NA  Suicidal Thoughts:  No  Homicidal Thoughts:  No  Memory:  Immediate;   Fair Recent;   Fair Remote;   Fair  Judgement:  Poor  Insight:  Shallow  Psychomotor Activity:  Tremor  Concentration:  Good  Recall:  NA  Akathisia:  NA  Handed:  Right  AIMS (if indicated):     Assets:  Desire for Improvement  Sleep:      Seen by Dr Tawni Carnes and this writer.  Patient is already accepted for another detox treatment.  This is one of his several detox treatment.  Patient states he is ready for treatment because drinking too much mouth wash is beginning to affect his health-Liver.  He also want to resume his anti depressant medication  and may  need more medications for his depression..  We will resume his Celexa when he is admitted.  He denies SI/HI/AVH.  Dahlia Byes  PMHNP-BC

## 2013-02-20 NOTE — Progress Notes (Signed)
Nutrition Brief Note  Wt Readings from Last 15 Encounters:  01/30/13 163 lb (73.936 kg)  11/13/12 152 lb (68.947 kg)  04/27/12 156 lb 12 oz (71.1 kg)  04/23/12 154 lb 4.8 oz (69.99 kg)  03/10/12 159 lb 2.8 oz (72.201 kg)   Malnutrition Screening Tool result is inaccurate. Pt reports poor appetite since leaving [facility] but reports this has resolved since admission. Denies any weight loss. No nutrition concerns identified at this time. Please consult if nutrition needs are identified.  Levon Hedger MS, RD, LDN [phone] Pager [phone] After Hours Pager

## 2013-02-20 NOTE — ED Notes (Signed)
1 pt belongings bag and 1 medication bag sent with [facility] transported.

## 2013-02-20 NOTE — ED Notes (Signed)
[facility] arrived for transportation; Councillors at bedside with pt. Will wait for consultation to end.

## 2013-02-20 NOTE — H&P (Signed)
Psychiatric Admission Assessment Adult  Patient Identification:  [name] [name] Date of Evaluation:  02/20/2013 Chief Complaint:  Substance Induced Mood Disorder History of Present Illness:  62 year old male with a history of alcohol abuse, chronic neuropathy and depression. He is here with a three-day history of suicidal ideation. He states that life has no purpose and everyone that was ever important to him is dead or lives out of town. He states we are only here to die. He has considered taking an overdose of pills. He admits to excessive drinking, notably mouthwash; he admits to being intoxicated at the present time. One of the contributing factors to his suicidality is moderate to severe neuropathy causing pain and numbness in his arms and legs. He is having difficulty ambulating and has to use a walker to get around. He states when his Neurontin dose was 800 mg 3 times a day he was getting good relief but his dose was reduced to 400 mg 3 times a day.   Elements:  Location:  generalized. Quality:  acute. Severity:  severe. Timing:  constant. Duration:  3 days of SI. Context:  stressors. Associated Signs/Synptoms: Depression Symptoms:  depressed mood, feelings of worthlessness/guilt, hopelessness, (Hypo) Manic Symptoms:  None Anxiety Symptoms:  Excessive Worry, Psychotic Symptoms:  None PTSD Symptoms:  None  Psychiatric Specialty Exam: Physical Exam  Constitutional: He is oriented to person, place, and time. He appears well-developed and well-nourished.  HENT:  Head: Normocephalic and atraumatic.  Neck: Normal range of motion.  Respiratory: Effort normal.  GI: Soft.  Neurological: He is alert and oriented to person, place, and time.  Skin: Skin is warm and dry.    Review of Systems  Constitutional: Negative.   HENT: Negative.   Eyes: Negative.   Respiratory: Negative.   Cardiovascular: Negative.   Gastrointestinal: Negative.   Genitourinary: Negative.    Musculoskeletal: Negative.   Skin: Negative.   Neurological: Negative.   Endo/Heme/Allergies: Negative.   Psychiatric/Behavioral: Positive for depression and substance abuse.    Blood pressure 115/70, pulse 73, temperature 97.5 F (36.4 C), temperature source Oral, height 5\' 7"  (1.702 m).There is no weight on file to calculate BMI.  General Appearance: Casual  Eye Contact::  Fair  Speech:  Normal Rate  Volume:  Normal  Mood:  Depressed  Affect:  Congruent  Thought Process:  Coherent  Orientation:  Full (Time, Place, and Person)  Thought Content:  WDL  Suicidal Thoughts:  Yes.  with intent/plan  Homicidal Thoughts:  No  Memory:  Immediate;   Fair Recent;   Fair Remote;   Fair  Judgement:  Poor  Insight:  Lacking  Psychomotor Activity:  Decreased  Concentration:  Fair  Recall:  Fair  Akathisia:  No  Handed:  Right  AIMS (if indicated):     Assets:  Resilience  Sleep:       Past Psychiatric History: Diagnosis:  Depression, alcohol dependency  Hospitalizations:  [facility]  Outpatient Care:  [facility]  Substance Abuse Care:  [facility], [facility]  Self-Mutilation:  None  Suicidal Attempts:  None  Violent Behaviors:  None   Past Medical History:   Past Medical History  Diagnosis Date  . Hypercholesteremia   . Stroke   . Hypertension   . Alcohol abuse   . Seizures   . Depression   . Homelessness    None. Allergies:   Allergies  Allergen Reactions  . Pollen Extract    PTA Medications: Prescriptions prior to admission  Medication Sig Dispense Refill  .  amLODipine (NORVASC) 10 MG tablet Take 1 tablet (10 mg total) by mouth daily.  30 tablet  0  . citalopram (CELEXA) 20 MG tablet Take 1 tablet (20 mg total) by mouth daily.  30 tablet  0  . clopidogrel (PLAVIX) 75 MG tablet Take 1 tablet (75 mg total) by mouth daily with breakfast.  30 tablet  0  . gabapentin (NEURONTIN) 400 MG capsule Take 1 capsule (400 mg total) by mouth 4 (four) times daily.  120 capsule  0  . lisinopril  (PRINIVIL,ZESTRIL) 10 MG tablet Take 1 tablet (10 mg total) by mouth daily. For high blood pressure control  30 tablet  0    Previous Psychotropic Medications:  Medication/Dose    See above     Substance Abuse History in the last 12 months:  yes  Consequences of Substance Abuse: Family Consequences:  estranged relationships  Social History:  reports that he has been smoking Cigarettes.  He has been smoking about 0.25 packs per day. He does not have any smokeless tobacco history on file. He reports that he drinks alcohol. He reports that he does not use illicit drugs. Additional Social History: History of alcohol / drug use?: Yes  Current Place of Residence:   Place of Birth:   Family Members: Marital Status:  Divorced Children:  Sons:  Daughters: Relationships: Education:  Corporate treasurer Problems/Performance: Religious Beliefs/Practices: History of Abuse (Emotional/Phsycial/Sexual) Teacher, music History:  None. Legal History: Hobbies/Interests:  Family History:  History reviewed. No pertinent family history.  Results for orders placed during the hospital encounter of 02/20/13 (from the past 72 hour(s))  ETHANOL     Status: Abnormal   Collection Time    02/20/13  1:45 AM      Result Value Range   Alcohol, Ethyl (B) 266 (*) 0 - 11 mg/dL   Comment:            LOWEST DETECTABLE LIMIT FOR     SERUM ALCOHOL IS 11 mg/dL     FOR MEDICAL PURPOSES ONLY  CBC WITH DIFFERENTIAL     Status: Abnormal   Collection Time    02/20/13  1:45 AM      Result Value Range   WBC 5.4  4.0 - 10.5 K/uL   RBC 3.96 (*) 4.22 - 5.81 MIL/uL   Hemoglobin 13.4  13.0 - 17.0 g/dL   HCT 40.9 (*) 81.1 - 91.4 %   MCV 98.0  78.0 - 100.0 fL   MCH 33.8  26.0 - 34.0 pg   MCHC 34.5  30.0 - 36.0 g/dL   RDW 78.2  95.6 - 21.3 %   Platelets 139 (*) 150 - 400 K/uL   Neutrophils Relative % 67  43 - 77 %   Neutro Abs 3.6  1.7 - 7.7 K/uL   Lymphocytes Relative 24  12 - 46 %    Lymphs Abs 1.3  0.7 - 4.0 K/uL   Monocytes Relative 6  3 - 12 %   Monocytes Absolute 0.3  0.1 - 1.0 K/uL   Eosinophils Relative 4  0 - 5 %   Eosinophils Absolute 0.2  0.0 - 0.7 K/uL   Basophils Relative 0  0 - 1 %   Basophils Absolute 0.0  0.0 - 0.1 K/uL  COMPREHENSIVE METABOLIC PANEL     Status: Abnormal   Collection Time    02/20/13  1:45 AM      Result Value Range   Sodium 131 (*) 135 - 145 mEq/L  Potassium 3.5  3.5 - 5.1 mEq/L   Chloride 92 (*) 96 - 112 mEq/L   CO2 20  19 - 32 mEq/L   Glucose, Bld 84  70 - 99 mg/dL   BUN 12  6 - 23 mg/dL   Creatinine, Ser 7.82  0.50 - 1.35 mg/dL   Calcium 9.5  8.4 - 95.6 mg/dL   Total Protein 8.0  6.0 - 8.3 g/dL   Albumin 4.1  3.5 - 5.2 g/dL   AST 51 (*) 0 - 37 U/L   ALT 32  0 - 53 U/L   Alkaline Phosphatase 79  39 - 117 U/L   Total Bilirubin 0.4  0.3 - 1.2 mg/dL   GFR calc non Af Amer >90  >90 mL/min   GFR calc Af Amer >90  >90 mL/min   Comment: (NOTE)     The eGFR has been calculated using the CKD EPI equation.     This calculation has not been validated in all clinical situations.     eGFR's persistently <90 mL/min signify possible Chronic Kidney     Disease.  SALICYLATE LEVEL     Status: Abnormal   Collection Time    02/20/13  1:45 AM      Result Value Range   Salicylate Lvl <2.0 (*) 2.8 - 20.0 mg/dL  ACETAMINOPHEN LEVEL     Status: None   Collection Time    02/20/13  1:45 AM      Result Value Range   Acetaminophen (Tylenol), Serum <15.0  10 - 30 ug/mL   Comment:            THERAPEUTIC CONCENTRATIONS VARY     SIGNIFICANTLY. A RANGE OF 10-30     ug/mL MAY BE AN EFFECTIVE     CONCENTRATION FOR MANY PATIENTS.     HOWEVER, SOME ARE BEST TREATED     AT CONCENTRATIONS OUTSIDE THIS     RANGE.     ACETAMINOPHEN CONCENTRATIONS     >150 ug/mL AT 4 HOURS AFTER     INGESTION AND >50 ug/mL AT 12     HOURS AFTER INGESTION ARE     OFTEN ASSOCIATED WITH TOXIC     REACTIONS.  URINE RAPID DRUG SCREEN (HOSP PERFORMED)     Status: None    Collection Time    02/20/13  2:27 AM      Result Value Range   Opiates NONE DETECTED  NONE DETECTED   Cocaine NONE DETECTED  NONE DETECTED   Benzodiazepines NONE DETECTED  NONE DETECTED   Amphetamines NONE DETECTED  NONE DETECTED   Tetrahydrocannabinol NONE DETECTED  NONE DETECTED   Barbiturates NONE DETECTED  NONE DETECTED   Comment:            DRUG SCREEN FOR MEDICAL PURPOSES     ONLY.  IF CONFIRMATION IS NEEDED     FOR ANY PURPOSE, NOTIFY LAB     WITHIN 5 DAYS.                LOWEST DETECTABLE LIMITS     FOR URINE DRUG SCREEN     Drug Class       Cutoff (ng/mL)     Amphetamine      1000     Barbiturate      200     Benzodiazepine   200     Tricyclics       300     Opiates  300     Cocaine          300     THC              50   Psychological Evaluations:  Assessment:   DSM5:  Substance/Addictive Disorders:  Alcohol Related Disorder - Severe (303.90), Alcohol Intoxication with Use Disorder - Severe (F10.229) and Alcohol Withdrawal (291.81) Depressive Disorders:  Major Depressive Disorder - Severe (296.23)  AXIS I:  Alcohol Abuse, Anxiety Disorder NOS and Major Depression, Recurrent severe AXIS II:  Deferred AXIS III:   Past Medical History  Diagnosis Date  . Hypercholesteremia   . Stroke   . Hypertension   . Alcohol abuse   . Seizures   . Depression   . Homelessness    AXIS IV:  other psychosocial or environmental problems, problems related to social environment and problems with primary support group AXIS V:  41-50 serious symptoms  Treatment Plan/Recommendations:  Plan:  Review of chart, vital signs, medications, and notes. 1-Admit for crisis management and stabilization.  Estimated length of stay 5-7 days past his current stay of 1 2-Individual and group therapy encouraged 3-Medication management for depression, alcohol withdrawal/detox and anxiety to reduce current symptoms to base line and improve the patient's overall level of functioning:   Medications reviewed with the patient and he stated no untoward effects, home medications in place and Librium protocol started 4-Coping skills for depression, substance abuse, and anxiety developing-- 5-Continue crisis stabilization and management 6-Address health issues--monitoring vital signs, stable  7-Treatment plan in progress to prevent relapse of depression, substance abuse, and anxiety 8-Psychosocial education regarding relapse prevention and self-care 8-Health care follow up as needed for any health concerns  9-Call for consult with hospitalist for additional specialty patient services as needed.  Treatment Plan Summary: Daily contact with patient to assess and evaluate symptoms and progress in treatment Medication management Current Medications:  Current Facility-Administered Medications  Medication Dose Route Frequency Provider Last Rate Last Dose  . acetaminophen (TYLENOL) tablet 650 mg  650 mg Oral Q6H PRN Kristeen Mans, NP      . alum & mag hydroxide-simeth (MAALOX/MYLANTA) 200-200-20 MG/5ML suspension 30 mL  30 mL Oral Q4H PRN Kristeen Mans, NP      . amLODipine (NORVASC) tablet 10 mg  10 mg Oral Daily Nanine Means, NP      . chlordiazePOXIDE (LIBRIUM) capsule 25 mg  25 mg Oral Q6H PRN Kristeen Mans, NP      . chlordiazePOXIDE (LIBRIUM) capsule 25 mg  25 mg Oral QID Kristeen Mans, NP       Followed by  . [START ON 02/22/2013] chlordiazePOXIDE (LIBRIUM) capsule 25 mg  25 mg Oral TID Kristeen Mans, NP       Followed by  . [START ON 02/23/2013] chlordiazePOXIDE (LIBRIUM) capsule 25 mg  25 mg Oral BH-qamhs Kristeen Mans, NP       Followed by  . [START ON 02/24/2013] chlordiazePOXIDE (LIBRIUM) capsule 25 mg  25 mg Oral Daily Kristeen Mans, NP      . citalopram (CELEXA) tablet 20 mg  20 mg Oral Daily Nanine Means, NP      . Melene Muller ON 02/21/2013] clopidogrel (PLAVIX) tablet 75 mg  75 mg Oral Q breakfast Nanine Means, NP      . gabapentin (NEURONTIN) capsule 400 mg  400 mg Oral QID  Nanine Means, NP      . hydrOXYzine (ATARAX/VISTARIL) tablet 25 mg  25 mg  Oral Q6H PRN Kristeen Mans, NP      . lisinopril (PRINIVIL,ZESTRIL) tablet 10 mg  10 mg Oral Daily Nanine Means, NP      . loperamide (IMODIUM) capsule 2-4 mg  2-4 mg Oral PRN Kristeen Mans, NP      . magnesium hydroxide (MILK OF MAGNESIA) suspension 30 mL  30 mL Oral Daily PRN Kristeen Mans, NP      . multivitamin with minerals tablet 1 tablet  1 tablet Oral Daily Kristeen Mans, NP   1 tablet at 02/20/13 1254  . ondansetron (ZOFRAN-ODT) disintegrating tablet 4 mg  4 mg Oral Q6H PRN Kristeen Mans, NP      . thiamine (B-1) injection 100 mg  100 mg Intramuscular Once Kristeen Mans, NP      . Melene Muller ON 02/21/2013] thiamine (VITAMIN B-1) tablet 100 mg  100 mg Oral Daily Kristeen Mans, NP      . traZODone (DESYREL) tablet 50 mg  50 mg Oral QHS PRN Kristeen Mans, NP        Observation Level/Precautions:  15 minute checks  Laboratory:  completed, reviewed, stable  Psychotherapy:  Individual and group therapy  Medications:  Celexa, Trazodone  Consultations:  None  Discharge Concerns:  Housing  Estimated LOS:  5-7 days  Other:     I certify that inpatient services furnished can reasonably be expected to improve the patient's condition.   Nanine Means, PMH-NP 12/5/20143:21 PM  Patient is seen for psychiatric evaluation, suicide risk assessment, this discussed with the treatment team and formulated treatment plan. Reviewed the information documented and agree with the treatment plan.  Curly Mackowski,JANARDHAHA R. 02/23/2013 3:15 PM

## 2013-02-20 NOTE — B[facility] Counselor (Signed)
Adult Psychosocial Assessment Update Interdisciplinary Team  Previous Behavior Health Hospital admissions/discharges:  Admissions Discharges  Date: 01/30/13 Date: 02/06/13  Date: 11/13/12 Date: 11/20/12  Date: Date:  Date: Date:  Date: Date:   Changes since the last Psychosocial Assessment (including adherence to outpatient mental health and/or substance abuse treatment, situational issues contributing to decompensation and/or relapse). Pt states that since last admission he continued to drink, which he reports makes his neuropathy worse.  Pt states that this week he became suicidal.  Pt states that he was at [facility] for 50 days a few months ago but denies this was helpful, stating that there were too many AA meetings.  Pt states that he went to [place] once for outpatient services.              Discharge Plan 1. Will you be returning to the same living situation after discharge?   Yes: X Pt was homeless on admission and will continue to be homeless.  Pt states that he was staying at motels and wants to get his own apartment upon d/c.   No:      If no, what is your plan?           2. Would you like a referral for services when you are discharged? Yes:  X   If yes, for what services? Pt wants to do outpatient treatment at this time.   No:              Summary and Recommendations (to be completed by the evaluator) Patient is a 62 year old Caucasian Male with a diagnosis of Substance Induced Mood Disorder and Alcohol Dependence.  Patient is currently homeless in [place].  Patient will benefit from crisis stabilization, medication evaluation, group therapy and psycho education in addition to case management for discharge planning.                         Signature:  Carmina Miller, 02/20/2013 2:29 PM

## 2013-02-20 NOTE — BH Assessment (Signed)
Assessment Note  [name] [name] is an 62 y.o. male. Patient presents suicidal with a plan to overdose on his home medications and seeking alcohol detox.   Patient states that he called EMS due to his increased depression and increased pain; for the past two weeks he has not had the desire to bathe, eat, or get out of bed ( sleeps 10 hours a day) except to drink. He states that for the last two days he has been thinking, " what is the point of me living to die and what I could do" to kill himself.   Patient states that he has been homeless for the past 8 years. He has no contact with his two sisters or his only daughter, his mother passed away in 08/26/06 and feels that he has no purpose to live. He denies any homicidal ideation or AVH. He is unable to contract for safety and is in need of inpatient crisis stabilization.  Axis I: Substance Induced Mood Disorder and Alcohol Dependence Axis II: Deferred Axis III:  Past Medical History  Diagnosis Date  . Hypercholesteremia   . Stroke   . Hypertension   . Alcohol abuse   . Seizures   . Depression   . Homelessness    Axis IV: economic problems, housing problems, other psychosocial or environmental problems, problems related to social environment, problems with access to health care services and problems with primary support group Axis V: 30  Past Medical History:  Past Medical History  Diagnosis Date  . Hypercholesteremia   . Stroke   . Hypertension   . Alcohol abuse   . Seizures   . Depression   . Homelessness     History reviewed. No pertinent past surgical history.  Family History: History reviewed. No pertinent family history.  Social History:  reports that he has been smoking Cigarettes.  He has been smoking about 0.25 packs per day. He does not have any smokeless tobacco history on file. He reports that he drinks alcohol. He reports that he does not use illicit drugs.  Additional Social History:  Alcohol / Drug Use History  of alcohol / drug use?: Yes Longest period of sobriety (when/how long): 1981/ 6 years of sobriety Negative Consequences of Use: Financial;Personal relationships Withdrawal Symptoms: Blackouts Substance #1 Name of Substance 1: Mouthwash/ Alcohol 1 - Age of First Use: 18 1 - Amount (size/oz): 2-3 pints mouthwash  1 - Frequency: Daily 1 - Duration: since Aug 25, 2005 1 - Last Use / Amount: today/ 2 pints  CIWA: CIWA-Ar BP: 107/59 mmHg Pulse Rate: 86 COWS:    Allergies: No Known Allergies  Home Medications:  (Not in a hospital admission)  OB/GYN Status:  No LMP for male patient.  General Assessment Data Location of Assessment: [facility] Is this a Tele or Face-to-Face Assessment?: Face-to-Face Is this an Initial Assessment or a Re-assessment for this encounter?: Initial Assessment Living Arrangements: Alone;Other (Comment) (lives in [place]) Can pt return to current living arrangement?: Yes Admission Status: Voluntary Is patient capable of signing voluntary admission?: Yes Transfer from: Home Referral Source: MD  Medical Screening Exam [facility] Walk-in ONLY) Medical Exam completed: Yes  [facility] Crisis Care Plan Living Arrangements: Alone;Other (Comment) (lives in [place]) Name of Psychiatrist: None Name of Therapist: None  Education Status Is patient currently in school?: No Current Grade: a Highest grade of school patient has completed: Na Name of school: NA Contact person: None verbalized  Risk to self Suicidal Ideation: Yes-Currently Present Suicidal Intent: Yes-Currently Present Is  patient at risk for suicide?: Yes Suicidal Plan?: Yes-Currently Present Specify Current Suicidal Plan: overdose on medications Access to Means: Yes Specify Access to Suicidal Means: home medications What has been your use of drugs/alcohol within the last 12 months?: Daily Previous Attempts/Gestures: No How many times?:  (None noted) Other Self Harm Risks: None noted Triggers for Past Attempts: Other  (Comment) (intoxication) Intentional Self Injurious Behavior: None Family Suicide History: Yes (Father/ PTSD) Recent stressful life event(s): Turmoil (Comment) (no purpose in life) Persecutory voices/beliefs?: No Depression: Yes Depression Symptoms: Despondent;Insomnia;Isolating;Feeling worthless/self pity Substance abuse history and/or treatment for substance abuse?: Yes Suicide prevention information given to non-admitted patients: Yes  Risk to Others Homicidal Ideation: No Thoughts of Harm to Others: No Current Homicidal Intent: No Current Homicidal Plan: No Access to Homicidal Means: No Identified Victim: Na History of harm to others?: No Assessment of Violence: None Noted Violent Behavior Description: Na Does patient have access to weapons?: No Criminal Charges Pending?: No Does patient have a court date: No  Psychosis Hallucinations: None noted Delusions: None noted  Mental Status Report Appear/Hygiene: Disheveled Eye Contact: Good Motor Activity: Freedom of movement;Unremarkable Speech: Logical/coherent Level of Consciousness: Alert Mood: Depressed;Worthless, low self-esteem Affect: Appropriate to circumstance Anxiety Level: None Thought Processes: Coherent;Relevant Judgement: Impaired Orientation: Person;Place;Time Obsessive Compulsive Thoughts/Behaviors: None  Cognitive Functioning Concentration: Decreased Memory: Recent Intact;Remote Intact IQ: Average Insight: Poor Impulse Control: Poor Appetite: Fair Weight Loss:  (None noted) Weight Gain:  (None noted) Sleep: Increased Total Hours of Sleep:  (10 hours/ day) Vegetative Symptoms: Staying in bed;Not bathing;Decreased grooming  ADLScreening [facility] Assessment Services) Patient's cognitive ability adequate to safely complete daily activities?: Yes Patient able to express need for assistance with ADLs?: Yes Independently performs ADLs?: Yes (appropriate for developmental age)  Prior Inpatient  Therapy Prior Inpatient Therapy: Yes Prior Therapy Dates: 01/30/13 Prior Therapy Facilty/Provider(s): [facility] Reason for Treatment: Detox  Prior Outpatient Therapy Prior Outpatient Therapy: No Prior Therapy Dates: Na Prior Therapy Facilty/Provider(s): Na Reason for Treatment: Na  ADL Screening (condition at time of admission) Patient's cognitive ability adequate to safely complete daily activities?: Yes Is the patient deaf or have difficulty hearing?: No Does the patient have difficulty seeing, even when wearing glasses/contacts?: No Does the patient have difficulty concentrating, remembering, or making decisions?: No Patient able to express need for assistance with ADLs?: Yes Does the patient have difficulty dressing or bathing?: No Independently performs ADLs?: Yes (appropriate for developmental age) Does the patient have difficulty walking or climbing stairs?: Yes Weakness of Legs: Both Weakness of Arms/Hands: None  Home Assistive Devices/Equipment Home Assistive Devices/Equipment: Environmental consultant (specify type)    Abuse/Neglect Assessment (Assessment to be complete while patient is alone) Physical Abuse: Denies Verbal Abuse: Denies Sexual Abuse: Denies Exploitation of patient/patient's resources: Denies Self-Neglect: Denies Values / Beliefs Cultural Requests During Hospitalization: None Spiritual Requests During Hospitalization: None   Advance Directives (For Healthcare) Advance Directive: Patient does not have advance directive;Patient would not like information    Additional Information 1:1 In Past 12 Months?: No CIRT Risk: No Elopement Risk: No Does patient have medical clearance?: Yes     Disposition:  Disposition Initial Assessment Completed for this Encounter: Yes Disposition of Patient: Inpatient treatment program Type of inpatient treatment program: Adult  On Site Evaluation by:   Reviewed with Physician:    Rudi Coco 02/20/2013 4:48 AM

## 2013-02-20 NOTE — Consult Note (Signed)
Pt was interviewed with NP Josephine. Note reviewed. Agree with assessment and plan to admit to [facility].

## 2013-02-20 NOTE — Progress Notes (Signed)
[facility] provided pt with a list of primary care resources, highlighting [facility].  °

## 2013-02-20 NOTE — Progress Notes (Signed)
Patient has been accepted to [facility] by Alberteen Sam NP to the services of Dr. Elsie Saas, 501.1. Patient to arrive at [facility] after 0800.

## 2013-02-20 NOTE — [facility] Group Notes (Signed)
[facility] LCSW Group Therapy  Feelings Around Relapse 1:15 - 2:30 PM  02/20/2013 2:15 PM  Type of Therapy:  Group Therapy  Participation Level:  Did Not Attend    Wynn Banker 02/20/2013, 2:15 PM

## 2013-02-20 NOTE — ED Notes (Signed)
Pt send with [facility] transporter, Angela Nevin.

## 2013-02-20 NOTE — ED Provider Notes (Signed)
CSN: [id number]     Arrival date & time 02/20/13  0023 History   First MD Initiated Contact with Patient 02/20/13 0054     Chief Complaint  Patient presents with  . Suicidal   (Consider location/radiation/quality/duration/timing/severity/associated sxs/prior Treatment) HPI This 62 year old male with a history of alcohol abuse, chronic neuropathy and depression. He is here with a three-day history of suicidal ideation. He states that life has no purpose and everyone that was ever important to him is dead or lives out of town. He states we are only here to die. He has considered taking an overdose of pills. He admits to excessive drinking, notably mouthwash; he admits to being intoxicated at the present time. One of the contributing factors to his suicidality is moderate to severe neuropathy causing pain and numbness in his arms and legs. He is having difficulty ambulating and has to use a walker to get around. He states when his Neurontin dose was 800 mg 3 times a day he was getting good relief but his dose was reduced to 400 mg 3 times a day.  Past Medical History  Diagnosis Date  . Hypercholesteremia   . Stroke   . Hypertension   . Alcohol abuse   . Seizures   . Depression   . Homelessness    History reviewed. No pertinent past surgical history. History reviewed. No pertinent family history. History  Substance Use Topics  . Smoking status: Current Every Day Smoker -- 0.25 packs/day    Types: Cigarettes  . Smokeless tobacco: Not on file  . Alcohol Use: Yes     Comment: drinks bottles of 2 pint  mouthwash daily    Review of Systems  All other systems reviewed and are negative.    Allergies  Review of patient's allergies indicates no known allergies.  Home Medications   Current Outpatient Rx  Name  Route  Sig  Dispense  Refill  . amLODipine (NORVASC) 10 MG tablet   Oral   Take 1 tablet (10 mg total) by mouth daily.   30 tablet   0   . citalopram (CELEXA) 20 MG tablet   Oral   Take 1 tablet (20 mg total) by mouth daily.   30 tablet   0   . clopidogrel (PLAVIX) 75 MG tablet   Oral   Take 1 tablet (75 mg total) by mouth daily with breakfast.   30 tablet   0   . gabapentin (NEURONTIN) 400 MG capsule   Oral   Take 1 capsule (400 mg total) by mouth 4 (four) times daily.   120 capsule   0   . lisinopril (PRINIVIL,ZESTRIL) 10 MG tablet   Oral   Take 1 tablet (10 mg total) by mouth daily. For high blood pressure control   30 tablet   0    BP 107/59  Pulse 86  Temp(Src) 97.8 F (36.6 C) (Oral)  SpO2 95%  Physical Exam General: Well-developed, well-nourished male in no acute distress; appearance consistent with age of record HENT: normocephalic; atraumatic Eyes: pupils equal, round and reactive to light; extraocular muscles intact Neck: supple Heart: regular rate and rhythm Lungs: clear to auscultation bilaterally Abdomen: soft; nondistended; nontender; no masses or hepatosplenomegaly; bowel sounds present Extremities: No deformity; full range of motion; pulses normal Neurologic: Awake, alert; dysarthria; ataxia; motor function intact in all extremities and symmetric; no facial droop Skin: Warm and dry Psychiatric: Intoxicated; suicidal ideation; depression    ED Course  Procedures (including critical care time)  MDM  Nursing notes and vitals signs, including pulse oximetry, reviewed.  Summary of this visit's results, reviewed by myself:  Labs:  Results for orders placed during the hospital encounter of 02/20/13 (from the past 24 hour(s))  ETHANOL     Status: Abnormal   Collection Time    02/20/13  1:45 AM      Result Value Range   Alcohol, Ethyl (B) 266 (*) 0 - 11 mg/dL  CBC WITH DIFFERENTIAL     Status: Abnormal   Collection Time    02/20/13  1:45 AM      Result Value Range   WBC 5.4  4.0 - 10.5 K/uL   RBC 3.96 (*) 4.22 - 5.81 MIL/uL   Hemoglobin 13.4  13.0 - 17.0 g/dL   HCT 29.5 (*) 28.4 - 13.2 %   MCV 98.0  78.0 - 100.0  fL   MCH 33.8  26.0 - 34.0 pg   MCHC 34.5  30.0 - 36.0 g/dL   RDW 44.0  10.2 - 72.5 %   Platelets 139 (*) 150 - 400 K/uL   Neutrophils Relative % 67  43 - 77 %   Neutro Abs 3.6  1.7 - 7.7 K/uL   Lymphocytes Relative 24  12 - 46 %   Lymphs Abs 1.3  0.7 - 4.0 K/uL   Monocytes Relative 6  3 - 12 %   Monocytes Absolute 0.3  0.1 - 1.0 K/uL   Eosinophils Relative 4  0 - 5 %   Eosinophils Absolute 0.2  0.0 - 0.7 K/uL   Basophils Relative 0  0 - 1 %   Basophils Absolute 0.0  0.0 - 0.1 K/uL  COMPREHENSIVE METABOLIC PANEL     Status: Abnormal   Collection Time    02/20/13  1:45 AM      Result Value Range   Sodium 131 (*) 135 - 145 mEq/L   Potassium 3.5  3.5 - 5.1 mEq/L   Chloride 92 (*) 96 - 112 mEq/L   CO2 20  19 - 32 mEq/L   Glucose, Bld 84  70 - 99 mg/dL   BUN 12  6 - 23 mg/dL   Creatinine, Ser 3.66  0.50 - 1.35 mg/dL   Calcium 9.5  8.4 - 44.0 mg/dL   Total Protein 8.0  6.0 - 8.3 g/dL   Albumin 4.1  3.5 - 5.2 g/dL   AST 51 (*) 0 - 37 U/L   ALT 32  0 - 53 U/L   Alkaline Phosphatase 79  39 - 117 U/L   Total Bilirubin 0.4  0.3 - 1.2 mg/dL   GFR calc non Af Amer >90  >90 mL/min   GFR calc Af Amer >90  >90 mL/min  SALICYLATE LEVEL     Status: Abnormal   Collection Time    02/20/13  1:45 AM      Result Value Range   Salicylate Lvl <2.0 (*) 2.8 - 20.0 mg/dL  ACETAMINOPHEN LEVEL     Status: None   Collection Time    02/20/13  1:45 AM      Result Value Range   Acetaminophen (Tylenol), Serum <15.0  10 - 30 ug/mL  URINE RAPID DRUG SCREEN (HOSP PERFORMED)     Status: None   Collection Time    02/20/13  2:27 AM      Result Value Range   Opiates NONE DETECTED  NONE DETECTED   Cocaine NONE DETECTED  NONE DETECTED   Benzodiazepines NONE DETECTED  NONE DETECTED   Amphetamines NONE DETECTED  NONE DETECTED   Tetrahydrocannabinol NONE DETECTED  NONE DETECTED   Barbiturates NONE DETECTED  NONE DETECTED       Hanley Seamen, MD 02/20/13 [phone]

## 2013-02-20 NOTE — ED Notes (Signed)
Suicidal ideation.

## 2013-02-20 NOTE — Progress Notes (Signed)
Patients medication documented an sent to pharmacy by pharmacy technician.

## 2013-02-20 NOTE — Tx Team (Signed)
Initial Interdisciplinary Treatment Plan  PATIENT STRENGTHS: (choose at least two) Average or above average intelligence Capable of independent living  PATIENT STRESSORS: Financial difficulties Health problems Loss of Mother Marital or family conflict Substance abuse   PROBLEM LIST: Problem List/Patient Goals Date to be addressed Date deferred Reason deferred Estimated date of resolution  Homelessness 02/20/2013   02/27/2013  Substance Abuse 02/20/2013   02/27/2013  Suicidal Ideation 02/20/2013   02/27/2013  Depression 02/20/2013   02/27/2013                                 DISCHARGE CRITERIA:  Ability to meet basic life and health needs Adequate post-discharge living arrangements Improved stabilization in mood, thinking, and/or behavior Motivation to continue treatment in a less acute level of care Need for constant or close observation no longer present Reduction of life-threatening or endangering symptoms to within safe limits Verbal commitment to aftercare and medication compliance Withdrawal symptoms are absent or subacute and managed without 24-hour nursing intervention  PRELIMINARY DISCHARGE PLAN: Attend 12-step recovery group Outpatient therapy Placement in alternative living arrangements  PATIENT/FAMIILY INVOLVEMENT: This treatment plan has been presented to and reviewed with the patient, [name] [name], and/or family member.  The patient and family have been given the opportunity to ask questions and make suggestions.  [name] [name] Shari Prows 02/20/2013, 12:05 PM

## 2013-02-20 NOTE — [facility] Group Notes (Signed)
[facility] LCSW Aftercare Discharge Planning Group Note   02/20/2013 12:39 PM  Participation Quality:  Did not attend group.   [name] [name]   

## 2013-02-20 NOTE — ED Notes (Signed)
Bed: WA10 Expected date: 02/19/13 Expected time: 11:54 PM Means of arrival: Ambulance Comments: 62 yo M  ETOH/Suicidal

## 2013-02-21 DIAGNOSIS — F411 Generalized anxiety disorder: Secondary | ICD-10-CM

## 2013-02-21 DIAGNOSIS — F1020 Alcohol dependence, uncomplicated: Secondary | ICD-10-CM

## 2013-02-21 DIAGNOSIS — F1010 Alcohol abuse, uncomplicated: Secondary | ICD-10-CM

## 2013-02-21 DIAGNOSIS — R45851 Suicidal ideations: Secondary | ICD-10-CM

## 2013-02-21 DIAGNOSIS — F332 Major depressive disorder, recurrent severe without psychotic features: Principal | ICD-10-CM

## 2013-02-21 NOTE — [facility] Group Notes (Signed)
[facility] Group Notes:  (Clinical Social Work)  02/21/2013   3:00-4:00PM  Summary of Progress/Problems:   The main focus of today's process group was for the patient to identify ways in which they have sabotaged their own mental health wellness/recovery.  Motivational interviewing was used to explore the reasons they engage in this behavior, and reasons they may have for wanting to change.  The Stages of Change were explained to the group using a handout, and patients identified where they are with regard to changing self-defeating behaviors.  The patient expressed that he is in the hospital with suicidal ideation, and said he self-sabotages with a drinking problem.  He has been in long-term treatment four different times.  He feels he is in the Action Stage of Change, which he states is an "inaction" stage of NOT drinking, NOT going around the same people, NOT repeating the same behaviors.  He was disorganized and monopolizing.  Type of Therapy:  Process Group  Participation Level:  Active  Participation Quality:  Monopolizing, Resistant and Sharing  Affect:  Blunted and Labile  Cognitive:  Disorganized  Insight:  Limited  Engagement in Therapy:  Engaged  Modes of Intervention:  Education, Motivational Interviewing   Ambrose Mantle, LCSW 02/21/2013, 4:00pm

## 2013-02-21 NOTE — Progress Notes (Signed)
[facility] MD Progress Note  02/21/2013 4:32 PM [name] [name]  MRN:  [id number] Subjective:  Sleep was "restless due to machine from other guy, sounds like a wind", appetite is "not too good, don't like some of the stuff they make", constant suicidal ideations, patient interacts with other clients and students, remains fixated on the negative, talkative but tangential. Diagnosis:   DSM5:  Substance/Addictive Disorders:  Alcohol Related Disorder - Severe (303.90), Alcohol Intoxication with Use Disorder - Severe (F10.229) and Alcohol Withdrawal (291.81) Depressive Disorders:  Major Depressive Disorder - Severe (296.23)  Axis I: Alcohol Abuse, Anxiety Disorder NOS and Major Depression, Recurrent severe Axis II: Deferred Axis III:  Past Medical History  Diagnosis Date  . Hypercholesteremia   . Stroke   . Hypertension   . Alcohol abuse   . Seizures   . Depression   . Homelessness    Axis IV: other psychosocial or environmental problems, problems related to social environment and problems with primary support group Axis V: 41-50 serious symptoms  ADL's:  Intact  Sleep: Poor  Appetite:  Fair  Suicidal Ideation:  Plan:  none Intent:  yes Means:  none Homicidal Ideation:  Denies   Psychiatric Specialty Exam: Review of Systems  Constitutional: Negative.   HENT: Negative.   Eyes: Negative.   Respiratory: Negative.   Cardiovascular: Negative.   Gastrointestinal: Negative.   Genitourinary: Negative.   Musculoskeletal: Negative.   Skin: Negative.   Neurological: Negative.   Endo/Heme/Allergies: Negative.   Psychiatric/Behavioral: Positive for depression and substance abuse. The patient is nervous/anxious.     Blood pressure 100/63, pulse 80, temperature 97.3 F (36.3 C), temperature source Oral, resp. rate 20, height 5\' 7"  (1.702 m).There is no weight on file to calculate BMI.  General Appearance: Casual  Eye Contact::  Fair  Speech:  Normal Rate  Volume:  Normal  Mood:   Anxious and Depressed  Affect:  Congruent  Thought Process:  Circumstantial  Orientation:  Full (Time, Place, and Person)  Thought Content:  WDL  Suicidal Thoughts:  Yes.  without intent/plan  Homicidal Thoughts:  No  Memory:  Immediate;   Fair Recent;   Fair Remote;   Fair  Judgement:  Poor  Insight:  Lacking  Psychomotor Activity:  Decreased  Concentration:  Fair  Recall:  Fair  Akathisia:  No  Handed:  Right  AIMS (if indicated):     Assets:  Leisure Time Resilience  Sleep:      Current Medications: Current Facility-Administered Medications  Medication Dose Route Frequency Provider Last Rate Last Dose  . acetaminophen (TYLENOL) tablet 650 mg  650 mg Oral Q6H PRN Kristeen Mans, NP      . alum & mag hydroxide-simeth (MAALOX/MYLANTA) 200-200-20 MG/5ML suspension 30 mL  30 mL Oral Q4H PRN Kristeen Mans, NP      . amLODipine (NORVASC) tablet 10 mg  10 mg Oral Daily Nanine Means, NP   10 mg at 02/21/13 0817  . chlordiazePOXIDE (LIBRIUM) capsule 25 mg  25 mg Oral Q6H PRN Kristeen Mans, NP      . chlordiazePOXIDE (LIBRIUM) capsule 25 mg  25 mg Oral QID Kristeen Mans, NP   25 mg at 02/21/13 1152   Followed by  . [START ON 02/22/2013] chlordiazePOXIDE (LIBRIUM) capsule 25 mg  25 mg Oral TID Kristeen Mans, NP       Followed by  . [START ON 02/23/2013] chlordiazePOXIDE (LIBRIUM) capsule 25 mg  25 mg Oral BH-qamhs Kristeen Mans,  NP       Followed by  . [START ON 02/24/2013] chlordiazePOXIDE (LIBRIUM) capsule 25 mg  25 mg Oral Daily Kristeen Mans, NP      . citalopram (CELEXA) tablet 20 mg  20 mg Oral Daily Nanine Means, NP   20 mg at 02/21/13 0817  . clopidogrel (PLAVIX) tablet 75 mg  75 mg Oral Q breakfast Nanine Means, NP   75 mg at 02/21/13 0818  . gabapentin (NEURONTIN) capsule 400 mg  400 mg Oral QID Nanine Means, NP   400 mg at 02/21/13 1151  . hydrOXYzine (ATARAX/VISTARIL) tablet 25 mg  25 mg Oral Q6H PRN Kristeen Mans, NP      . lisinopril (PRINIVIL,ZESTRIL) tablet 10 mg  10 mg  Oral Daily Nanine Means, NP   10 mg at 02/21/13 0817  . loperamide (IMODIUM) capsule 2-4 mg  2-4 mg Oral PRN Kristeen Mans, NP      . magnesium hydroxide (MILK OF MAGNESIA) suspension 30 mL  30 mL Oral Daily PRN Kristeen Mans, NP      . multivitamin with minerals tablet 1 tablet  1 tablet Oral Daily Kristeen Mans, NP   1 tablet at 02/21/13 0817  . ondansetron (ZOFRAN-ODT) disintegrating tablet 4 mg  4 mg Oral Q6H PRN Kristeen Mans, NP      . thiamine (B-1) injection 100 mg  100 mg Intramuscular Once Kristeen Mans, NP      . thiamine (VITAMIN B-1) tablet 100 mg  100 mg Oral Daily Kristeen Mans, NP   100 mg at 02/21/13 0817  . traZODone (DESYREL) tablet 50 mg  50 mg Oral QHS PRN Kristeen Mans, NP        Lab Results:  Results for orders placed during the hospital encounter of 02/20/13 (from the past 48 hour(s))  ETHANOL     Status: Abnormal   Collection Time    02/20/13  1:45 AM      Result Value Range   Alcohol, Ethyl (B) 266 (*) 0 - 11 mg/dL   Comment:            LOWEST DETECTABLE LIMIT FOR     SERUM ALCOHOL IS 11 mg/dL     FOR MEDICAL PURPOSES ONLY  CBC WITH DIFFERENTIAL     Status: Abnormal   Collection Time    02/20/13  1:45 AM      Result Value Range   WBC 5.4  4.0 - 10.5 K/uL   RBC 3.96 (*) 4.22 - 5.81 MIL/uL   Hemoglobin 13.4  13.0 - 17.0 g/dL   HCT 40.9 (*) 81.1 - 91.4 %   MCV 98.0  78.0 - 100.0 fL   MCH 33.8  26.0 - 34.0 pg   MCHC 34.5  30.0 - 36.0 g/dL   RDW 78.2  95.6 - 21.3 %   Platelets 139 (*) 150 - 400 K/uL   Neutrophils Relative % 67  43 - 77 %   Neutro Abs 3.6  1.7 - 7.7 K/uL   Lymphocytes Relative 24  12 - 46 %   Lymphs Abs 1.3  0.7 - 4.0 K/uL   Monocytes Relative 6  3 - 12 %   Monocytes Absolute 0.3  0.1 - 1.0 K/uL   Eosinophils Relative 4  0 - 5 %   Eosinophils Absolute 0.2  0.0 - 0.7 K/uL   Basophils Relative 0  0 - 1 %   Basophils Absolute 0.0  0.0 -  0.1 K/uL  COMPREHENSIVE METABOLIC PANEL     Status: Abnormal   Collection Time    02/20/13  1:45 AM       Result Value Range   Sodium 131 (*) 135 - 145 mEq/L   Potassium 3.5  3.5 - 5.1 mEq/L   Chloride 92 (*) 96 - 112 mEq/L   CO2 20  19 - 32 mEq/L   Glucose, Bld 84  70 - 99 mg/dL   BUN 12  6 - 23 mg/dL   Creatinine, Ser 1.61  0.50 - 1.35 mg/dL   Calcium 9.5  8.4 - 09.6 mg/dL   Total Protein 8.0  6.0 - 8.3 g/dL   Albumin 4.1  3.5 - 5.2 g/dL   AST 51 (*) 0 - 37 U/L   ALT 32  0 - 53 U/L   Alkaline Phosphatase 79  39 - 117 U/L   Total Bilirubin 0.4  0.3 - 1.2 mg/dL   GFR calc non Af Amer >90  >90 mL/min   GFR calc Af Amer >90  >90 mL/min   Comment: (NOTE)     The eGFR has been calculated using the CKD EPI equation.     This calculation has not been validated in all clinical situations.     eGFR's persistently <90 mL/min signify possible Chronic Kidney     Disease.  SALICYLATE LEVEL     Status: Abnormal   Collection Time    02/20/13  1:45 AM      Result Value Range   Salicylate Lvl <2.0 (*) 2.8 - 20.0 mg/dL  ACETAMINOPHEN LEVEL     Status: None   Collection Time    02/20/13  1:45 AM      Result Value Range   Acetaminophen (Tylenol), Serum <15.0  10 - 30 ug/mL   Comment:            THERAPEUTIC CONCENTRATIONS VARY     SIGNIFICANTLY. A RANGE OF 10-30     ug/mL MAY BE AN EFFECTIVE     CONCENTRATION FOR MANY PATIENTS.     HOWEVER, SOME ARE BEST TREATED     AT CONCENTRATIONS OUTSIDE THIS     RANGE.     ACETAMINOPHEN CONCENTRATIONS     >150 ug/mL AT 4 HOURS AFTER     INGESTION AND >50 ug/mL AT 12     HOURS AFTER INGESTION ARE     OFTEN ASSOCIATED WITH TOXIC     REACTIONS.  URINE RAPID DRUG SCREEN (HOSP PERFORMED)     Status: None   Collection Time    02/20/13  2:27 AM      Result Value Range   Opiates NONE DETECTED  NONE DETECTED   Cocaine NONE DETECTED  NONE DETECTED   Benzodiazepines NONE DETECTED  NONE DETECTED   Amphetamines NONE DETECTED  NONE DETECTED   Tetrahydrocannabinol NONE DETECTED  NONE DETECTED   Barbiturates NONE DETECTED  NONE DETECTED   Comment:             DRUG SCREEN FOR MEDICAL PURPOSES     ONLY.  IF CONFIRMATION IS NEEDED     FOR ANY PURPOSE, NOTIFY LAB     WITHIN 5 DAYS.                LOWEST DETECTABLE LIMITS     FOR URINE DRUG SCREEN     Drug Class       Cutoff (ng/mL)     Amphetamine      1000  Barbiturate      200     Benzodiazepine   200     Tricyclics       300     Opiates          300     Cocaine          300     THC              50    Physical Findings: AIMS: Facial and Oral Movements Muscles of Facial Expression: None, normal Lips and Perioral Area: None, normal Jaw: None, normal Tongue: None, normal,Extremity Movements Upper (arms, wrists, hands, fingers): None, normal Lower (legs, knees, ankles, toes): None, normal, Trunk Movements Neck, shoulders, hips: None, normal, Overall Severity Severity of abnormal movements (highest score from questions above): None, normal Incapacitation due to abnormal movements: None, normal Patient's awareness of abnormal movements (rate only patient's report): No Awareness, Dental Status Current problems with teeth and/or dentures?: No Does patient usually wear dentures?: No  CIWA:  CIWA-Ar Total: 3 COWS:     Treatment Plan Summary: Daily contact with patient to assess and evaluate symptoms and progress in treatment Medication management  Plan:  Review of chart, vital signs, medications, and notes. 1-Individual and group therapy 2-Medication management for depression, alcohol abuse, and anxiety:  Medications reviewed with the patient and no negative side effects, no changes made 3-Coping skills for depression, anxiety, and alcohol abuse 4-Continue crisis stabilization and management 5-Address health issues--monitoring vital signs, stable 6-Treatment plan in progress to prevent relapse of depression, alcohol abuse, and anxiety  Medical Decision Making Problem Points:  Established problem, stable/improving (1) and Review of psycho-social stressors (1) Data Points:  Review of  medication regiment & side effects (2)  I certify that inpatient services furnished can reasonably be expected to improve the patient's condition.   Nanine Means, PMH-NP 02/21/2013, 4:32 PM I agreed with findings and treatment plan of this patient

## 2013-02-21 NOTE — Progress Notes (Signed)
Adult Psychoeducational Group Note  Date:  02/21/2013 Time:  8:00 pm  Group Topic/Focus:  Wrap-Up Group:   The focus of this group is to help patients review their daily goal of treatment and discuss progress on daily workbooks.  Participation Level:  Active  Participation Quality:  Appropriate and Sharing  Affect:  Appropriate  Cognitive:  Appropriate  Insight: Appropriate  Engagement in Group:  Engaged  Modes of Intervention:  Discussion, Education, Socialization and Support  Additional Comments:  Pt stated that he is in the hospital for alcoholism. Pt stated that he needs to get a medication adjustment. Pt could not identify any coping skills that he has learned while at the hospital.   Laural Benes, Veva Holes 02/21/2013, 10:51 PM

## 2013-02-21 NOTE — Progress Notes (Signed)
Patient ID: [name] [name], male   DOB: [birth year], 62 y.o.   MRN: [id number] D)  Has been somewhat irritable this evening, attended group, said he has been through detox 5 or 6 times.  States he attended [facility] but didn't care for their program, stated he felt more depressed after hearing some of their speakers than he had prior to attending, felt more hopeless and didn't want to go back there.  Currently states his sx are minimal, as he is on the librium protocol, and states he hasn't had enough time to feel the withdrawal sx.  Has been in the w/c this evening d/t high fall risk. A) Will continue to monitor for safety, continue POC R)  Safety maintained.

## 2013-02-21 NOTE — [facility] Group Notes (Signed)
[facility] Group Notes:  (Nursing/MHT/Case Management/Adjunct)  Date:  02/21/2013  Time:  2:33 PM  Type of Therapy:  Nurse Education  Participation Level:  Minimal  Participation Quality:  Attentive  Affect:  Appropriate  Cognitive:  Alert  Insight:  Appropriate  Engagement in Group:  Engaged  Modes of Intervention:  Education  Summary of Progress/Problems:pt would like to get his medications fixed  Nicole Cella 02/21/2013, 2:33 PM

## 2013-02-21 NOTE — Progress Notes (Signed)
D) Pt has been attending the groups and interacting with his peers. Mood is pleasant. Reports that he is depressed due to not having a place to live. Also, is upset over the pain in his feet and stated that he himself is the cause of the neuropathy due to his drinking. Is limited in his ability to process. States he is not doing anything until he speaks with the doctor. Rates his depression and hopelessness both at a 10 and admits to thoughts of SI A) Given support and encouragement. Praised for his attendance and partisapation in the groups. Encouraged to speak with the provider about his medications. R) Contracts for safety.

## 2013-02-21 NOTE — Progress Notes (Signed)
Adult Psychoeducational Group Note  Date:  02/21/2013 Time:  2:44 PM  Group Topic/Focus:  Healthy Communication:   The focus of this group is to discuss communication, barriers to communication, as well as healthy ways to communicate with others.  Participation Level:  Active  Participation Quality:  Appropriate  Affect:  Appropriate  Cognitive:  Appropriate  Insight: Good  Engagement in Group:  Developing/Improving  Modes of Intervention:  Activity and Discussion    Elijio Miles 02/21/2013, 2:44 PM

## 2013-02-21 NOTE — [facility] Suicide Risk Assessment (Signed)
Suicide Risk Assessment  Admission Assessment     Nursing information obtained from:  Patient Demographic factors:  Male;Caucasian;Low socioeconomic status;Living alone;Unemployed Current Mental Status:  Suicidal ideation indicated by patient;Suicide plan;Self-harm thoughts Loss Factors:  Loss of significant relationship;Financial problems / change in socioeconomic status;Decline in physical health Historical Factors:  Family history of mental illness or substance abuse Risk Reduction Factors:     CLINICAL FACTORS:   Severe Anxiety and/or Agitation Depression:   Anhedonia Comorbid alcohol abuse/dependence Hopelessness Impulsivity Insomnia Dysthymia Chronic Pain Unstable or Poor Therapeutic Relationship Previous Psychiatric Diagnoses and Treatments Medical Diagnoses and Treatments/Surgeries  COGNITIVE FEATURES THAT CONTRIBUTE TO RISK:  Closed-mindedness Polarized thinking    SUICIDE RISK:   Severe:  Frequent, intense, and enduring suicidal ideation, specific plan, no subjective intent, but some objective markers of intent (i.e., choice of lethal method), the method is accessible, some limited preparatory behavior, evidence of impaired self-control, severe dysphoria/symptomatology, multiple risk factors present, and few if any protective factors, particularly a lack of social support.  PLAN OF CARE: Feeling down, lonely and depressed. Poor insight and needs stabilization.  I certify that inpatient services furnished can reasonably be expected to improve the patient's condition.  Raigan Baria 02/21/2013, 9:55 AM

## 2013-02-22 MED ORDER — GABAPENTIN 400 MG PO CAPS
500.0000 mg | ORAL_CAPSULE | Freq: Four times a day (QID) | ORAL | Status: DC
  Administered 2013-02-22 – 2013-02-23 (×5): 500 mg via ORAL
  Filled 2013-02-22 (×12): qty 1

## 2013-02-22 NOTE — Progress Notes (Signed)
Patient ID: [name] [name], male   DOB: 02-05-1951, 62 y.o.   MRN: [id number] D)  Has been out in the dayroom this evening, sitting in his w/c, attended group and has been watching tv.  Asked about his plans when he leaves, began talking about his mother living in a nursing home and issues that had come up.  He had been her POA, but when he tried to complain about the care, he was barred from being able to come back, and she had died within that year.  Doesn't have much contact with his sisters as he felt they should have been more involved, and doesn't have much contact with his daughter as he doesn't want her to worry about him.  States that's why he began to feel suicidal and hopeless, and doesn't really feel he has much of a discharge plan and his health is declining.  States has stayed at hotels but they are expensive.  Trying to decide where he will go from here.  Has been pleasant, cooperative, sad.   A)  Will continue to monitor for safety, continue POC R)  Safety maintained.

## 2013-02-22 NOTE — Progress Notes (Signed)
Adult Psychoeducational Group Note  Date:  02/22/2013 Time:  2:02 PM  Group Topic/Focus:  Therapeutic Activity  Participation Level:  None  Participation Quality:  Inattentive  Affect:  Irritable  Cognitive:  Appropriate  Insight: Limited  Engagement in Group:  None  Modes of Intervention:  Activity  Additional Comments:  Pt did not appear to enjoy group activity. Pt stated that games where for "children" and did not participate in group session.   Tora Perches N 02/22/2013, 2:02 PM

## 2013-02-22 NOTE — Progress Notes (Addendum)
Patient ID: [name] [name], male   DOB: 1950/03/31, 62 y.o.   MRN: [id number] D)  Pt attended group this evening, stated his father had been a major in the military and was a high functioning alcoholic, started drinking his father's beer at age 20.  Related that he has been thru rehab 5-6 times, always seems to relapse.  Began drinking mouthwash so that the police wouldn't pick him up for an open container, has been living on the streets.  States now dealing with the results of his bad choices, referring to his neuropathy, difficulty walking, realizes he has to make changes.  Would like an apt to live in, tired of the streets.   Later reported to [facility] that he had fallen this afternoon before dinner, but it was busy and he hadn't reported the fall..  Stated his roommate had left paper towels on the floor, and when he bent down to pick tham up so that he wouldn't slip on them, he lost his balance and fell.  Denies hitting his head, stated he landed on rt side and his elbow.  NP and supervisor made aware, fall sheet started.  No skin breaks, no bruising noted, although there is an old scab present on his rt elbow, but no bleeding noted, some swelling noted to elbow.  States rt hip and side tender, no bruising noted at this time.  A)  Will continue to monitor for safety, will reassess as per protocol, will continue POC R)  Safety maintained at this time.  Addendum:  NP and AC here to evaluate, ice pack to rt elbow for swelling,  Also some bruising noted to left arm where pt fell "the other day", Np noted bruise on rt hip also.

## 2013-02-22 NOTE — Progress Notes (Signed)
[facility] MD Progress Note  02/22/2013 9:57 AM Dorman Calderwood  MRN:  [id number] Subjective:  Patient stated his sleep was better, remains "not very good" but his room-mate's machine was still too noisy.  Appetite is "normal", depression is "lousy" with suicidal ideations and "bad" anxiety, irritable mood.  Appears to be saying what he needs to remain here.  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 Diagnosis:   DSM5:  Substance/Addictive Disorders:  Alcohol Related Disorder - Severe (303.90), Alcohol Intoxication with Use Disorder - Severe (F10.229) and Alcohol Withdrawal (291.81) Depressive Disorders:  Major Depressive Disorder - Severe (296.23)  Axis I: Alcohol Abuse, Anxiety Disorder NOS and Major Depression, Recurrent severe Axis II: Deferred Axis III:  Past Medical History  Diagnosis Date  . Hypercholesteremia   . Stroke   . Hypertension   . Alcohol abuse   . Seizures   . Depression   . Homelessness    Axis IV: other psychosocial or environmental problems, problems related to social environment and problems with primary support group Axis V: 41-50 serious symptoms  ADL's:  Intact  Sleep: Poor  Appetite:  Good  Suicidal Ideation:  Plan:  none Intent:  yes Means:  none Homicidal Ideation:  None  Psychiatric Specialty Exam: Review of Systems  Constitutional: Negative.   HENT: Negative.   Eyes: Negative.   Respiratory: Negative.   Cardiovascular: Negative.   Gastrointestinal: Negative.   Genitourinary: Negative.   Musculoskeletal: Negative.   Skin: Negative.   Neurological: Negative.   Endo/Heme/Allergies: Negative.   Psychiatric/Behavioral: Positive for depression, suicidal ideas and substance abuse. The patient is nervous/anxious.     Blood pressure 90/58, pulse 91, temperature 97.8 F (36.6 C), temperature source Oral, resp. rate 16, height 5\' 7"  (1.702 m).There is no weight on file to  calculate BMI.  General Appearance: Casual  Eye Contact::  Fair  Speech:  Normal Rate  Volume:  Increased  Mood:  Irritable  Affect:  Congruent  Thought Process:  Coherent  Orientation:  Full (Time, Place, and Person)  Thought Content:  WDL  Suicidal Thoughts:  Yes.  with intent/plan  Homicidal Thoughts:  No  Memory:  Immediate;   Fair Recent;   Fair Remote;   Fair  Judgement:  Fair  Insight:  Lacking  Psychomotor Activity:  Normal  Concentration:  Fair  Recall:  Fair  Akathisia:  No  Handed:  Right  AIMS (if indicated):     Assets:  Resilience  Sleep:  Number of Hours: 5.25   Current Medications: Current Facility-Administered Medications  Medication Dose Route Frequency Provider Last Rate Last Dose  . acetaminophen (TYLENOL) tablet 650 mg  650 mg Oral Q6H PRN Kristeen Mans, NP      . alum & mag hydroxide-simeth (MAALOX/MYLANTA) 200-200-20 MG/5ML suspension 30 mL  30 mL Oral Q4H PRN Kristeen Mans, NP      . amLODipine (NORVASC) tablet 10 mg  10 mg Oral Daily Nanine Means, NP   10 mg at 02/22/13 0800  . chlordiazePOXIDE (LIBRIUM) capsule 25 mg  25 mg Oral Q6H PRN Kristeen Mans, NP      . chlordiazePOXIDE (LIBRIUM) capsule 25 mg  25 mg Oral TID Kristeen Mans, NP   25 mg at 02/22/13 0801   Followed by  . [START ON 02/23/2013] chlordiazePOXIDE (LIBRIUM) capsule 25 mg  25 mg Oral BH-qamhs Kristeen Mans, NP       Followed by  . [  START ON 02/24/2013] chlordiazePOXIDE (LIBRIUM) capsule 25 mg  25 mg Oral Daily Kristeen Mans, NP      . citalopram (CELEXA) tablet 20 mg  20 mg Oral Daily Nanine Means, NP   20 mg at 02/22/13 0802  . clopidogrel (PLAVIX) tablet 75 mg  75 mg Oral Q breakfast Nanine Means, NP   75 mg at 02/22/13 0803  . gabapentin (NEURONTIN) capsule 500 mg  500 mg Oral QID Nanine Means, NP      . hydrOXYzine (ATARAX/VISTARIL) tablet 25 mg  25 mg Oral Q6H PRN Kristeen Mans, NP      . lisinopril (PRINIVIL,ZESTRIL) tablet 10 mg  10 mg Oral Daily Nanine Means, NP   10 mg at  02/22/13 0804  . loperamide (IMODIUM) capsule 2-4 mg  2-4 mg Oral PRN Kristeen Mans, NP      . magnesium hydroxide (MILK OF MAGNESIA) suspension 30 mL  30 mL Oral Daily PRN Kristeen Mans, NP      . multivitamin with minerals tablet 1 tablet  1 tablet Oral Daily Kristeen Mans, NP   1 tablet at 02/22/13 0805  . ondansetron (ZOFRAN-ODT) disintegrating tablet 4 mg  4 mg Oral Q6H PRN Kristeen Mans, NP      . thiamine (B-1) injection 100 mg  100 mg Intramuscular Once Kristeen Mans, NP      . thiamine (VITAMIN B-1) tablet 100 mg  100 mg Oral Daily Kristeen Mans, NP   100 mg at 02/22/13 0803  . traZODone (DESYREL) tablet 50 mg  50 mg Oral QHS PRN Kristeen Mans, NP        Lab Results: No results found for this or any previous visit (from the past 48 hour(s)).  Physical Findings: AIMS: Facial and Oral Movements Muscles of Facial Expression: None, normal Lips and Perioral Area: None, normal Jaw: None, normal Tongue: None, normal,Extremity Movements Upper (arms, wrists, hands, fingers): None, normal Lower (legs, knees, ankles, toes): None, normal, Trunk Movements Neck, shoulders, hips: None, normal, Overall Severity Severity of abnormal movements (highest score from questions above): None, normal Incapacitation due to abnormal movements: None, normal Patient's awareness of abnormal movements (rate only patient's report): No Awareness, Dental Status Current problems with teeth and/or dentures?: No Does patient usually wear dentures?: No  CIWA:  CIWA-Ar Total: 3 COWS:     Treatment Plan Summary: Daily contact with patient to assess and evaluate symptoms and progress in treatment Medication management  Plan:  Review of chart, vital signs, medications, and notes. 1-Individual and group therapy 2-Medication management for depression, alcohol abuse, and anxiety:  Medications reviewed with the patient and Gabapentin increased for his pain issues 3-Coping skills for depression, anxiety, and alcohol  abuse 4-Continue crisis stabilization and management 5-Address health issues--monitoring vital signs, stable 6-Treatment plan in progress to prevent relapse of depression, alcohol abuse, and anxiety  Medical Decision Making Problem Points:  Established problem, stable/improving (1) and Review of psycho-social stressors (1) Data Points:  Review of new medications or change in dosage (2)  I certify that inpatient services furnished can reasonably be expected to improve the patient's condition.   Nanine Means, PMH-NP 02/22/2013, 9:57 AM I agreed with findings and treatment plan of this patient

## 2013-02-22 NOTE — Progress Notes (Addendum)
Patient ID: [name] [name], male   DOB: 07-Sep-1950, 62 y.o.   MRN: [id number] D:  Patient's self inventory sheet, patient denied SI & HI, contracts for safety.  Rated depression, hopeless and anxiety #10.  Stated he saw shadows 3 days ago, does not see pink elephants today.  Hears voices in his head and images in his brain.  Left leg has hurt in the past usually #4 and left side.  Zero pain goal.   A:  Medications administered per MD orders.  Emotional support and encouragement given patient. R:  Denied SI and HI, contracts for safety.  Last saw shadows 3 days ago.  Voices/images in his head.  Will continue to monitor patient for safety with 15 minute checks.  Safety maintained. Patient pushes his wheelchair or sits in his wheelchair.  Patient statet his 2 sisters are in conflict with him over his mother in a nursing home.  [place] police involved and banned him from nursing home.  Patient became upset because nurse talked to him this morning and filled out his self inventory sheet.  2 hrs later patient did not remember and wanted to fill out his form and planned to call authorities about situation.  Patient tore up self inventory and was given new form for him to fill out.  Stated he wanted to do this himself and he wanted to write a letter about this. Patient also told nurse this morning that he ran out of mouthwash 30% and his left arm started hurting.  "Mouthwash is same stuff in liquor store burboun.  Cops can't touch me.  I go to [employer] and they don't care, buy mouthwash with food stamps."  Patient did go to morning group in wheelchair.  1200  Patient upset because VS were taken from a different dinimap that was used this morning.  Patient went to lunch in dining room and did take 1200 meds willingly.  Patient stated he fell last night while taking a shower.  NP informed.  No bruised, swollen, discolored on back/hips/legs.  Patient denied pain.  Will continue to monitor patient  for safety. 30 minutes later, patient informed [facility] that he fell last night and hurt his left arm, not his back.  Small red area inside left arm with bruise size of quarter around red area.  Scrap down left inner arm approximately 6 inches.  No signs of swelling or drainage.  NP Aggie and Asher Muir informed.  Charge nurse informed.  Patient denied pain/discomfort.

## 2013-02-22 NOTE — [facility] Group Notes (Signed)
[facility] Group Notes: (Clinical Social Work)   02/22/2013   3:00-4:00PM  Summary of Progress/Problems: The main focus of today's process group was to identify the patient's current support system and explore what other supports can be put in place. There was also an extensive discussion about what constitutes a healthy support versus an unhealthy support. With some group members' miscomprehension about the role of counseling in recovery, a great deal of the time was spent on discussing expectations for self and others in treatment. The patient stated the current support in place is Annette Stable, a friend from a [place] that he has known for years.  The patient brought up a specific organization that he felt was unhelpful, and talked at length and was difficult to redirect when talking about cursing at AA/NA meetings he has attended.  He kept coming back to his complaints, despite both CSW redirection and other patients telling him to refocus on the future.  He demonstrated little insight into both his issues as well as the distractions he was making in the group.  Type of Therapy: Process Group with Motivational Interviewing   Participation Level: Active   Participation Quality: Attentive and Sharing, Monopolizing  Affect: Blunted and Anxious  Cognitive: Oriented   Insight: Poor  Engagement in Therapy: Limited  Modes of Intervention: Support and Processing   Ambrose Mantle, LCSW 02/22/2013, 4:27 PM

## 2013-02-22 NOTE — Progress Notes (Signed)
Psychoeducational Group Note  Date:  02/22/2013 Time: 1015 Group Topic/Focus:  Making Healthy Choices:   The focus of this group is to help patients identify negative/unhealthy choices they were using prior to admission and identify positive/healthier coping strategies to replace them upon discharge.  Participation Level:  Active  Participation Quality:  Appropriate  Affect:  Appropriate  Cognitive:  Appropriate  Insight:  Improving  Engagement in Group:  Engaged  Additional Comments:    Rich Brave 7:35 PM. 02/22/2013

## 2013-02-22 NOTE — Progress Notes (Signed)
Adult Psychoeducational Group Note  Date:  02/22/2013 Time:  08:00am Group Topic/Focus:  Wrap-Up Group:   The focus of this group is to help patients review their daily goal of treatment and discuss progress on daily workbooks.  Participation Level:  Active  Participation Quality:  Appropriate and Attentive  Affect:  Appropriate  Cognitive:  Alert and Appropriate  Insight: Appropriate  Engagement in Group:  Engaged  Modes of Intervention:  Discussion and Education  Additional Comments:  Pt attended and participated in group. Wrap up group consisted of going over unit rules and asking how their day went and if they had any concerns or questions. Pt stated his day went  [facility]. Pt stated he was happy about his belongings been sent to him. Prior to that he was depressed.  Shelly Bombard D 02/22/2013, 8:59 PM

## 2013-02-22 NOTE — Progress Notes (Signed)
Psychoeducational Group Note  Psychoeducational Group Note  Date: 02/22/2013 Time:  0930  Group Topic/Focus:  Gratefulness:  The focus of this group is to help patients identify what two things they are most grateful for in their lives. What helps ground them and to center them on their work to their recovery.  Participation Level:  Active  Participation Quality:  Appropriate  Affect:  Appropriate  Cognitive:  Oriented  Insight:  Lacking  Engagement in Group:  Engaged  Additional Comments:   Dione Housekeeper

## 2013-02-23 DIAGNOSIS — F10239 Alcohol dependence with withdrawal, unspecified: Secondary | ICD-10-CM

## 2013-02-23 MED ORDER — IBUPROFEN 600 MG PO TABS
600.0000 mg | ORAL_TABLET | Freq: Four times a day (QID) | ORAL | Status: DC | PRN
  Administered 2013-02-23 – 2013-02-25 (×5): 600 mg via ORAL
  Filled 2013-02-23 (×6): qty 1

## 2013-02-23 MED ORDER — GABAPENTIN 400 MG PO CAPS
800.0000 mg | ORAL_CAPSULE | Freq: Four times a day (QID) | ORAL | Status: DC
  Administered 2013-02-23 – 2013-02-25 (×8): 800 mg via ORAL
  Filled 2013-02-23 (×15): qty 2

## 2013-02-23 NOTE — Progress Notes (Signed)
D-Pt c/o rt sided pain due to a fall he had yesterday,  Pt has had three falls in the last three days, Pt c/o high anxiety and depression, pt wants to find an apartment to live in and plans to stop drinking alcohol, Pt has concerns on how he will pay for his medications when hes d/ced from here A-Pt takes his medications and attends groups, pt cont. To get out of wheelchair to walk around R-pt will be closely monitored for safety

## 2013-02-23 NOTE — Progress Notes (Signed)
Patient ID: [name] [name], male   DOB: [birth year], 62 y.o.   MRN: [id number]  Assessed patient after patient sustained an unwitnessed fall earlier in the shift before going to dinner. Patient notified his nurse that he was having elbow pain and hip pain. No deformity noted to either elbow. Full flexion and extension bilaterally. No crepitus palpated. Soft tissue swelling palpated to right olecranon.  Rates pain to right elbow 6/10. Bruising noted to left antecubital fossa. Area over greater trochanter non-tender to touch. No deformity noted. No swelling noted. No external rotation or leg length shortening noted at this time.    Plan: Apply ice pack to right elbow as needed for comfort. May use Tylenol or Motrin as needed for pain.

## 2013-02-23 NOTE — Progress Notes (Signed)
Patient ID: [name] [name], male   DOB: 05/25/1950, 62 y.o.   MRN: [id number] [facility] MD Progress Note  02/23/2013 3:07 PM [name] [name]  MRN:  [id number]  Subjective:  Patient is seen and chart reviewed. Patient was admitted for alcohol dependence and needed alcohol detox treatment. Patient also suffering with the major depressive disorder. Patient reported he has a chronic back pain needed to increase his medication Neurontin 800 mg which had seemed to walk better. Patient stated that he lives in a motel and drinks one fifth of whiskey regularly. Patient stated depression is "lousy" with suicidal ideations and "bad" anxiety.  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  Diagnosis:   DSM5:  Substance/Addictive Disorders:  Alcohol Related Disorder - Severe (303.90), Alcohol Intoxication with Use Disorder - Severe (F10.229) and Alcohol Withdrawal (291.81) Depressive Disorders:  Major Depressive Disorder - Severe (296.23)  Axis I: Alcohol Abuse, Anxiety Disorder NOS and Major Depression, Recurrent severe Axis II: Deferred Axis III:  Past Medical History  Diagnosis Date  . Hypercholesteremia   . Stroke   . Hypertension   . Alcohol abuse   . Seizures   . Depression   . Homelessness    Axis IV: other psychosocial or environmental problems, problems related to social environment and problems with primary support group Axis V: 41-50 serious symptoms  ADL's:  Intact  Sleep: Poor  Appetite:  Good  Suicidal Ideation:  Plan:  none Intent:  yes Means:  none Homicidal Ideation:  None  Psychiatric Specialty Exam: Review of Systems  Constitutional: Negative.   HENT: Negative.   Eyes: Negative.   Respiratory: Negative.   Cardiovascular: Negative.   Gastrointestinal: Negative.   Genitourinary: Negative.   Musculoskeletal: Negative.   Skin: Negative.   Neurological: Negative.   Endo/Heme/Allergies:  Negative.   Psychiatric/Behavioral: Positive for depression, suicidal ideas and substance abuse. The patient is nervous/anxious.     Blood pressure 114/74, pulse 70, temperature 97.9 F (36.6 C), temperature source Oral, resp. rate 18, height 5\' 8"  (1.727 m), weight 72.576 kg (160 lb).Body mass index is 24.33 kg/(m^2).  General Appearance: Casual  Eye Contact::  Fair  Speech:  Normal Rate  Volume:  Increased  Mood:  Irritable  Affect:  Congruent  Thought Process:  Coherent  Orientation:  Full (Time, Place, and Person)  Thought Content:  WDL  Suicidal Thoughts:  Yes.  with intent/plan  Homicidal Thoughts:  No  Memory:  Immediate;   Fair Recent;   Fair Remote;   Fair  Judgement:  Fair  Insight:  Lacking  Psychomotor Activity:  Normal  Concentration:  Fair  Recall:  Fair  Akathisia:  No  Handed:  Right  AIMS (if indicated):     Assets:  Resilience  Sleep:  Number of Hours: 5.75   Current Medications: Current Facility-Administered Medications  Medication Dose Route Frequency Provider Last Rate Last Dose  . acetaminophen (TYLENOL) tablet 650 mg  650 mg Oral Q6H PRN Kristeen Mans, NP   650 mg at 02/23/13 1110  . alum & mag hydroxide-simeth (MAALOX/MYLANTA) 200-200-20 MG/5ML suspension 30 mL  30 mL Oral Q4H PRN Kristeen Mans, NP      . amLODipine (NORVASC) tablet 10 mg  10 mg Oral Daily Nanine Means, NP   10 mg at 02/23/13 0820  . chlordiazePOXIDE (LIBRIUM) capsule 25 mg  25 mg Oral BH-qamhs Kristeen Mans, NP   25 mg at 02/23/13 0865  .  citalopram (CELEXA) tablet 20 mg  20 mg Oral Daily Nanine Means, NP   20 mg at 02/23/13 0820  . clopidogrel (PLAVIX) tablet 75 mg  75 mg Oral Q breakfast Nanine Means, NP   75 mg at 02/23/13 0820  . gabapentin (NEURONTIN) capsule 800 mg  800 mg Oral QID Nehemiah Settle, MD      . ibuprofen (ADVIL,MOTRIN) tablet 600 mg  600 mg Oral Q6H PRN Kristeen Mans, NP   600 mg at 02/23/13 0827  . lisinopril (PRINIVIL,ZESTRIL) tablet 10 mg  10 mg Oral  Daily Nanine Means, NP   10 mg at 02/23/13 0820  . magnesium hydroxide (MILK OF MAGNESIA) suspension 30 mL  30 mL Oral Daily PRN Kristeen Mans, NP      . multivitamin with minerals tablet 1 tablet  1 tablet Oral Daily Kristeen Mans, NP   1 tablet at 02/23/13 0820  . thiamine (B-1) injection 100 mg  100 mg Intramuscular Once Kristeen Mans, NP      . thiamine (VITAMIN B-1) tablet 100 mg  100 mg Oral Daily Kristeen Mans, NP   100 mg at 02/23/13 0820  . traZODone (DESYREL) tablet 50 mg  50 mg Oral QHS PRN Kristeen Mans, NP        Lab Results: No results found for this or any previous visit (from the past 48 hour(s)).  Physical Findings: AIMS: Facial and Oral Movements Muscles of Facial Expression: None, normal Lips and Perioral Area: None, normal Jaw: None, normal Tongue: None, normal,Extremity Movements Upper (arms, wrists, hands, fingers): None, normal Lower (legs, knees, ankles, toes): None, normal, Trunk Movements Neck, shoulders, hips: None, normal, Overall Severity Severity of abnormal movements (highest score from questions above): None, normal Incapacitation due to abnormal movements: None, normal Patient's awareness of abnormal movements (rate only patient's report): No Awareness, Dental Status Current problems with teeth and/or dentures?: No Does patient usually wear dentures?: No  CIWA:  CIWA-Ar Total: 1 COWS:  COWS Total Score: 3  Treatment Plan Summary: Daily contact with patient to assess and evaluate symptoms and progress in treatment Medication management  Plan:  Review of chart, vital signs, medications, and notes. 1-Individual and group therapy 2-Medication management for depression, alcohol abuse, and anxiety:  Medications reviewed with the patient and Increase Neurontin 800 mg 4 times A day for chronic back pain and continue the detox protocol as scheduled.  3-Coping skills for depression, anxiety, and alcohol abuse 4-Continue crisis stabilization and  management 5-Address health issues--monitoring vital signs, stable 6-Treatment plan in progress to prevent relapse of depression, alcohol abuse, and anxiety  Medical Decision Making Problem Points:  Established problem, stable/improving (1) and Review of psycho-social stressors (1) Data Points:  Review of new medications or change in dosage (2)  I certify that inpatient services furnished can reasonably be expected to improve the patient's condition.   Nehemiah Settle.,  MD.  02/23/2013, 3:07 PM

## 2013-02-23 NOTE — B[facility] Group Notes (Addendum)
[facility] LCSW Aftercare Discharge Planning Group Note   02/23/2013 8:45 AM  Participation Quality:  Alert but Intrusive  Mood/Affect:  Irritable, Intrusive  Depression Rating:  10  Anxiety Rating:  10  Thoughts of Suicide:  Pt denies SI/HI  Will you contract for safety?   Yes  Current AVH:  Pt denies  Plan for Discharge/Comments:  Pt attended discharge planning group and actively participated in group.  CSW provided pt with today's workbook.  Pt states that he had two falls and is very sore from them and doesn't feel his pain is being helped.  Pt states that he is believes if he leaves too early he will be suicidal.  Pt states that if he leaves too early, he will have to live on the streets or motels but if he has more money he can get an apartment.  CSW will assess for appropriate referrals.  No further needs voiced by pt at this time.    Transportation Means: Pt reports access to transportation  Supports: No supports mentioned at this time  [name] Ivan, LCSW 02/23/2013 9:36 AM

## 2013-02-23 NOTE — [facility] Group Notes (Signed)
[facility] LCSW Group Therapy  02/23/2013  1:15 PM   Type of Therapy:  Group Therapy  Participation Level:  Minimal  Participation Quality:  Drowsy  Affect:  Irritable  Cognitive:  Lacking  Insight:  Lacking and Limited  Engagement in Therapy:  Lacking and Limited  Modes of Intervention:  Clarification, Confrontation, Discussion, Education, Exploration, Limit-setting, Orientation, Problem-solving, Rapport Building, Dance movement psychotherapist, Socialization and Support  Summary of Progress/Problems: Pt identified obstacles faced currently and processed barriers involved in overcoming these obstacles. Pt identified steps necessary for overcoming these obstacles and explored motivation (internal and external) for facing these difficulties head on. Pt further identified one area of concern in their lives and chose a goal to focus on for today.  Pt shared that he has been living on the street for the past 8 years and guesses he will still be homeless upon discharge.  Pt states that a Child psychotherapist is supposed to solve their problems and fix things for them.  CSW educated pt on a social worker's role.  Pt was than taken out of group to meet with the doctor.    [name] Ivan, LCSW 02/23/2013 2:54 PM

## 2013-02-23 NOTE — Progress Notes (Signed)
Recreation Therapy Notes  Date: 12.08.2014 Time: 3:00pm Location: [address] Day[address]  Group Topic: Wellness  Goal Area(s) Addresses:  Patient will define components of whole wellness. Patient will verbalize benefit to self of whole wellness.  Behavioral Response: Sleeping. Patient was seated in wheel chair in dayroom when LRT arrived to unit. Patient did not wake during recreation therapy group session.   Marykay Lex Takeila Thayne, LRT/CTRS  Jearl Klinefelter 02/23/2013 4:15 PM

## 2013-02-23 NOTE — Progress Notes (Signed)
Pt was bending down to wipe up coffee and fell back on his bottom and then leaned back and bumped the back of his head.  This is the third day in a row that he has fallen.  Fransisca Kaufmann NP notified.  VSS.  No change from initial assessment this am.  Will continue to closely monitor patient.

## 2013-02-23 NOTE — Progress Notes (Signed)
D:  Patient up and present in the milieu.  At the medication window frequently.  Continues to complain of pain on his right side in the rib cage area.  States he has had increased pain since his fall yesterday.   A:  Medications given as prescribed.  Also medicated for pain as requested.  Offered support and encouragement.   R:  Intrusive and irritable at times, but able to be redirected.  Interacting well with peers.  Safety is maintained with 15 checks.

## 2013-02-23 NOTE — Tx Team (Signed)
Interdisciplinary Treatment Plan Update (Adult)  Date: 02/23/2013  Time Reviewed:  9:45 AM  Progress in Treatment: Attending groups: Yes Participating in groups:  Yes Taking medication as prescribed:  Yes Tolerating medication:  Yes Family/Significant othe contact made: CSW assessing  Patient understands diagnosis:  Yes Discussing patient identified problems/goals with staff:  Yes Medical problems stabilized or resolved:  Yes Denies suicidal/homicidal ideation: Yes Issues/concerns per patient self-inventory:  Yes Other:  New problem(s) identified: N/A  Discharge Plan or Barriers: CSW assessing for appropriate referrals.  Reason for Continuation of Hospitalization: Anxiety Depression Medication Stabilization  Comments: N/A  Estimated length of stay: 3-5 days  For review of initial/current patient goals, please see plan of care.  Attendees: Patient:     Family:     Physician:  Dr. Javier Glazier 02/23/2013 10:43 AM   Nursing:   Neill Loft, RN 02/23/2013 10:43 AM   Clinical Social Worker:  Reyes Ivan, LCSW 02/23/2013 10:43 AM   Other: Verne Spurr, PA 02/23/2013 10:43 AM   Other:  Elizbeth Squires, care coordination 02/23/2013 10:43 AM   Other:  Linton Rump, RN 02/23/2013 10:43 AM   Other:  Quintella Reichert, RN 02/23/2013 10:43 AM   Other:    Other:    Other:    Other:    Other:    Other:     Scribe for Treatment Team:   Carmina Miller, 02/23/2013 10:43 AM

## 2013-02-23 NOTE — Progress Notes (Signed)
Adult Psychoeducational Group Note  Date:  02/23/2013 Time:  9:03 PM  Group Topic/Focus:  Wrap-Up Group:   The focus of this group is to help patients review their daily goal of treatment and discuss progress on daily workbooks.  Participation Level:  Active  Participation Quality:  Appropriate  Affect:  Appropriate  Cognitive:  Appropriate  Insight: Appropriate  Engagement in Group:  Improving  Modes of Intervention:  Support  Additional Comments:  Pt stated that one positive thing that happened today was tha he was able to get someone to pick up is stuff from the hotel he is staying at and that the guys plans to bring it to him when he can. Pt states that he is relieved that this is being taken care of  Kennard Fildes 02/23/2013, 9:03 PM

## 2013-02-23 NOTE — Progress Notes (Signed)
Adult Psychoeducational Group Note  Date:  02/23/2013 Time:  11:00 AM  Group Topic/Focus:  Dimensions of Wellness:   The focus of this group is to introduce the topic of wellness and discuss the role each dimension of wellness plays in total health.  Participation Level:  None  Participation Quality:  Appropriate  Affect:  Appropriate  Cognitive:  Alert  Insight: Appropriate  Engagement in Group:  None  Modes of Intervention:  Education  Additional Comments:  Patient attended group but did not participate in wellness activity even when prompted.  Merleen Milliner 02/23/2013, 2:12 PM

## 2013-02-24 MED ORDER — CHLORDIAZEPOXIDE HCL 25 MG PO CAPS
25.0000 mg | ORAL_CAPSULE | Freq: Once | ORAL | Status: AC
  Administered 2013-02-24: 25 mg via ORAL
  Filled 2013-02-24: qty 1

## 2013-02-24 MED ORDER — NICOTINE POLACRILEX 2 MG MT GUM
2.0000 mg | CHEWING_GUM | OROMUCOSAL | Status: DC | PRN
  Administered 2013-02-24: 2 mg via ORAL
  Filled 2013-02-24: qty 1

## 2013-02-24 NOTE — Progress Notes (Signed)
The focus of this group is to educate the patient on the purpose and policies of crisis stabilization and provide a format to answer questions about their admission.  The group details unit policies and expectations of patients while admitted. Patient attended group but did not forward positive information.

## 2013-02-24 NOTE — Progress Notes (Signed)
Adult Psychoeducational Group Note  Date:  02/24/2013 Time:  8:00 pm  Group Topic/Focus:  Wrap-Up Group:   The focus of this group is to help patients review their daily goal of treatment and discuss progress on daily workbooks.  Participation Level:  Active  Participation Quality:  Appropriate and Sharing  Affect:  Appropriate  Cognitive:  Appropriate  Insight: Appropriate  Engagement in Group:  Engaged  Modes of Intervention:  Discussion, Education, Socialization and Support  Additional Comments: Pt stated that he is in the hospital for alcohol and suicidal thoughts. Pt stated that he a honest and generous person.   Teddi Badalamenti 02/24/2013, 11:21 PM

## 2013-02-24 NOTE — Progress Notes (Signed)
[facility] MD Progress Note  02/24/2013 9:09 AM [name] [name]  MRN:  [id number]  Subjective:  Patient irritable, demanding pain medications for his legs.  Sleep "fair", appetite "good", depression and hopelessness is "shot to hell", suicidal ideations "comes and goes, it is a constant for the past eight years".  He has never acted upon these thoughts. His discharge is planned for tomorrow.  Diagnosis:   DSM5:  Substance/Addictive Disorders:  Alcohol Related Disorder - Severe (303.90), Alcohol Intoxication with Use Disorder - Severe (F10.229) and Alcohol Withdrawal (291.81) Depressive Disorders:  Major Depressive Disorder - Severe (296.23)  Axis I: Alcohol Abuse and Major Depression, Recurrent severe Axis II: Deferred Axis III:  Past Medical History  Diagnosis Date  . Hypercholesteremia   . Stroke   . Hypertension   . Alcohol abuse   . Seizures   . Depression   . Homelessness    Axis IV: economic problems, housing problems, other psychosocial or environmental problems, problems related to social environment and problems with primary support group Axis V: 41-50 serious symptoms  ADL's:  Intact  Sleep: Fair  Appetite:  Good  Suicidal Ideation:  Plan:  none Intent:  none Means:  none Homicidal Ideation:  Denies   Psychiatric Specialty Exam: Review of Systems  Constitutional: Negative.   HENT: Negative.   Eyes: Negative.   Respiratory: Negative.   Cardiovascular: Negative.   Gastrointestinal: Negative.   Genitourinary: Negative.   Musculoskeletal:       Leg pains  Skin: Negative.   Neurological: Negative.   Endo/Heme/Allergies: Negative.   Psychiatric/Behavioral: Positive for depression and substance abuse. The patient is nervous/anxious.     Blood pressure 104/71, pulse 67, temperature 97.9 F (36.6 C), temperature source Oral, resp. rate 18, height 5\' 8"  (1.727 m), weight 72.576 kg (160 lb).Body mass index is 24.33 kg/(m^2).  General Appearance: Casual  Eye  Contact::  Fair  Speech:  Normal Rate  Volume:  Normal  Mood:  Anxious  Affect:  Congruent  Thought Process:  Coherent  Orientation:  Full (Time, Place, and Person)  Thought Content:  WDL  Suicidal Thoughts:  Yes.  without intent/plan  Homicidal Thoughts:  No  Memory:  Immediate;   Fair Recent;   Fair Remote;   Fair  Judgement:  Fair  Insight:  Fair  Psychomotor Activity:  Normal  Concentration:  Fair  Recall:  Fair  Akathisia:  No  Handed:  Right  AIMS (if indicated):     Assets:  Resilience  Sleep:  Number of Hours: 5.75   Current Medications: Current Facility-Administered Medications  Medication Dose Route Frequency Provider Last Rate Last Dose  . acetaminophen (TYLENOL) tablet 650 mg  650 mg Oral Q6H PRN Kristeen Mans, NP   650 mg at 02/23/13 1911  . alum & mag hydroxide-simeth (MAALOX/MYLANTA) 200-200-20 MG/5ML suspension 30 mL  30 mL Oral Q4H PRN Kristeen Mans, NP      . amLODipine (NORVASC) tablet 10 mg  10 mg Oral Daily Nanine Means, NP   10 mg at 02/24/13 0824  . citalopram (CELEXA) tablet 20 mg  20 mg Oral Daily Nanine Means, NP   20 mg at 02/24/13 0823  . clopidogrel (PLAVIX) tablet 75 mg  75 mg Oral Q breakfast Nanine Means, NP   75 mg at 02/24/13 0823  . gabapentin (NEURONTIN) capsule 800 mg  800 mg Oral QID Nehemiah Settle, MD   800 mg at 02/24/13 0824  . ibuprofen (ADVIL,MOTRIN) tablet 600 mg  600  mg Oral Q6H PRN Kristeen Mans, NP   600 mg at 02/24/13 4696  . lisinopril (PRINIVIL,ZESTRIL) tablet 10 mg  10 mg Oral Daily Nanine Means, NP   10 mg at 02/24/13 0823  . magnesium hydroxide (MILK OF MAGNESIA) suspension 30 mL  30 mL Oral Daily PRN Kristeen Mans, NP      . multivitamin with minerals tablet 1 tablet  1 tablet Oral Daily Kristeen Mans, NP   1 tablet at 02/24/13 0824  . thiamine (B-1) injection 100 mg  100 mg Intramuscular Once Kristeen Mans, NP      . thiamine (VITAMIN B-1) tablet 100 mg  100 mg Oral Daily Kristeen Mans, NP   100 mg at 02/24/13  2952  . traZODone (DESYREL) tablet 50 mg  50 mg Oral QHS PRN Kristeen Mans, NP        Lab Results: No results found for this or any previous visit (from the past 48 hour(s)).  Physical Findings: AIMS: Facial and Oral Movements Muscles of Facial Expression: None, normal Lips and Perioral Area: None, normal Jaw: None, normal Tongue: None, normal,Extremity Movements Upper (arms, wrists, hands, fingers): None, normal Lower (legs, knees, ankles, toes): None, normal, Trunk Movements Neck, shoulders, hips: None, normal, Overall Severity Severity of abnormal movements (highest score from questions above): None, normal Incapacitation due to abnormal movements: None, normal Patient's awareness of abnormal movements (rate only patient's report): No Awareness, Dental Status Current problems with teeth and/or dentures?: No Does patient usually wear dentures?: No  CIWA:  CIWA-Ar Total: 0 COWS:  COWS Total Score: 3  Treatment Plan Summary: Daily contact with patient to assess and evaluate symptoms and progress in treatment Medication management  Plan:  Review of chart, vital signs, medications, and notes. 1-Individual and group therapy 2-Medication management for depression and anxiety:  Medications reviewed with the patient and he stated no untoward effects, no changes made 3-Coping skills for depression, anxiety 4-Continue crisis stabilization and management 5-Address health issues--monitoring vital signs, stable 6-Treatment plan in progress to prevent relapse of depression and anxiety  Medical Decision Making Problem Points:  Established problem, stable/improving (1) and Review of psycho-social stressors (1) Data Points:  Review of medication regiment & side effects (2)  I certify that inpatient services furnished can reasonably be expected to improve the patient's condition.   Nanine Means, PMH-NP 02/24/2013, 9:09 AM  Reviewed the information documented and agree with the treatment  plan.  Zayon Trulson,JANARDHAHA R. 02/24/2013 3:45 PM

## 2013-02-24 NOTE — B[facility] Suicide Risk Assessment (Signed)
[facility] Adult Inpatient Family/Significant Other Suicide Prevention Education  Suicide Prevention Education:   Patient Refusal for Family/Significant Other Suicide Prevention Education: The patient has refused to provide written consent for family/significant other to be provided Family/Significant Other Suicide Prevention Education during admission and/or prior to discharge.  Physician notified.  CSW provided suicide prevention information with patient.    The suicide prevention education provided includes the following:  Suicide risk factors  Suicide prevention and interventions  National Suicide Hotline telephone number  [facility] assessment telephone number  [facility] Emergency Assistance 911  [facility] and/or Residential Mobile Crisis Unit telephone number   [name] [name], [place] 02/24/2013 2:12 PM

## 2013-02-24 NOTE — Progress Notes (Signed)
Recreation Therapy Notes  Animal-Assisted Activity/Therapy (AAA/T) Program Checklist/Progress Notes Patient Eligibility Criteria Checklist & Daily Group note for Rec Tx Intervention  Date: 12.09.2014  Time: 2:45pm Location: [address]    AAA/T Program Assumption of Risk Form signed by Patient/ or Parent Legal Guardian yes  Patient is free of allergies or sever asthma  yes  Patient reports no fear of animals yes  Patient reports no history of cruelty to animals yes  Patient understands his/her participation is voluntary yes  Patient washes hands before animal contact yes  Patient washes hands after animal contact yes  Behavioral Response: Appropriate  Education: Hand Washing, Appropriate Animal Interaction   Education Outcome: Acknowledges understanding.   Marykay Lex Fate Caster, LRT/CTRS  Rudell Marlowe L 02/24/2013 4:14 PM

## 2013-02-24 NOTE — [facility] Group Notes (Signed)
[facility] LCSW Group Therapy  02/24/2013  1:15 PM   Type of Therapy:  Group Therapy  Participation Level:  Active  Participation Quality:  Attentive, Sharing and Supportive  Affect:  Depressed and Flat  Cognitive:  Alert and Oriented  Insight:  Developing/Improving and Engaged  Engagement in Therapy:  Developing/Improving and Engaged  Modes of Intervention:  Clarification, Confrontation, Discussion, Education, Exploration, Limit-setting, Orientation, Problem-solving, Rapport Building, Dance movement psychotherapist, Socialization and Support  Summary of Progress/Problems: The topic for group therapy was feelings about diagnosis.  Pt actively participated in group discussion on their past and current diagnosis and how they feel towards this.  Pt also identified how society and family members judge them, based on their diagnosis as well as stereotypes and stigmas.   Pt shared that he is chronically suicidal but has never acted on it, giving the example of the pills sitting next to him but choosing to get help instead.  Pt processed feelings of having no purpose in life due to not having anyone and feeling isolated.  Pt states that he feels he is living to die.  Pt actively participated and was engaged in group discussion.    [name] Ivan, LCSW 02/24/2013 2:54 PM

## 2013-02-24 NOTE — Progress Notes (Signed)
D: Pt is using a W/C to get around.Pt denies SI, HI, & AVH. A: Pupils are equal & reactive to light. A & O x,s 4. Continues on 15 minute checks. Supported & encouraged. R: Pt safety maintained.

## 2013-02-24 NOTE — Progress Notes (Signed)
Adult Psychoeducational Group Note  Date:  02/24/2013 Time:  1:26 PM  Group Topic/Focus:  Recovery Goals:   The focus of this group is to identify appropriate goals for recovery and establish a plan to achieve them.  Participation Level:  None   Participation Quality:    Affect:    Cognitive:    Insight:   Engagement in Group:    Modes of Intervention:    Additional Comments:  Pt attended group but did not participated with peers. Pt was sleeping in group.   Areg Bialas A 02/24/2013, 1:26 PM

## 2013-02-25 ENCOUNTER — Emergency Department (HOSPITAL_COMMUNITY)
Admission: EM | Admit: 2013-02-25 | Discharge: 2013-02-26 | Disposition: A | Discharge status: Home / Self Care | Payer: No Typology Code available for payment source | Attending: Emergency Medicine | Admitting: Emergency Medicine

## 2013-02-25 ENCOUNTER — Encounter (HOSPITAL_COMMUNITY): Payer: Self-pay | Admitting: Emergency Medicine

## 2013-02-25 DIAGNOSIS — F3289 Other specified depressive episodes: Secondary | ICD-10-CM | POA: Insufficient documentation

## 2013-02-25 DIAGNOSIS — R209 Unspecified disturbances of skin sensation: Secondary | ICD-10-CM | POA: Insufficient documentation

## 2013-02-25 DIAGNOSIS — Z7902 Long term (current) use of antithrombotics/antiplatelets: Secondary | ICD-10-CM | POA: Insufficient documentation

## 2013-02-25 DIAGNOSIS — Z5900 Homelessness unspecified: Secondary | ICD-10-CM | POA: Insufficient documentation

## 2013-02-25 DIAGNOSIS — I10 Essential (primary) hypertension: Secondary | ICD-10-CM | POA: Insufficient documentation

## 2013-02-25 DIAGNOSIS — G40909 Epilepsy, unspecified, not intractable, without status epilepticus: Secondary | ICD-10-CM | POA: Insufficient documentation

## 2013-02-25 DIAGNOSIS — E7800 Pure hypercholesterolemia, unspecified: Secondary | ICD-10-CM | POA: Insufficient documentation

## 2013-02-25 DIAGNOSIS — F329 Major depressive disorder, single episode, unspecified: Secondary | ICD-10-CM | POA: Insufficient documentation

## 2013-02-25 DIAGNOSIS — G629 Polyneuropathy, unspecified: Secondary | ICD-10-CM

## 2013-02-25 DIAGNOSIS — R531 Weakness: Secondary | ICD-10-CM

## 2013-02-25 DIAGNOSIS — F17200 Nicotine dependence, unspecified, uncomplicated: Secondary | ICD-10-CM | POA: Insufficient documentation

## 2013-02-25 DIAGNOSIS — F1020 Alcohol dependence, uncomplicated: Secondary | ICD-10-CM | POA: Insufficient documentation

## 2013-02-25 DIAGNOSIS — M6281 Muscle weakness (generalized): Secondary | ICD-10-CM | POA: Insufficient documentation

## 2013-02-25 DIAGNOSIS — Z8673 Personal history of transient ischemic attack (TIA), and cerebral infarction without residual deficits: Secondary | ICD-10-CM | POA: Insufficient documentation

## 2013-02-25 DIAGNOSIS — G609 Hereditary and idiopathic neuropathy, unspecified: Secondary | ICD-10-CM | POA: Insufficient documentation

## 2013-02-25 DIAGNOSIS — Z79899 Other long term (current) drug therapy: Secondary | ICD-10-CM | POA: Insufficient documentation

## 2013-02-25 LAB — COMPREHENSIVE METABOLIC PANEL
ALT: 63 U/L — ABNORMAL HIGH (ref 0–53)
AST: 67 U/L — ABNORMAL HIGH (ref 0–37)
Albumin: 3.9 g/dL (ref 3.5–5.2)
CO2: 25 mEq/L (ref 19–32)
Calcium: 9.3 mg/dL (ref 8.4–10.5)
Creatinine, Ser: 0.97 mg/dL (ref 0.50–1.35)
GFR calc non Af Amer: 87 mL/min — ABNORMAL LOW (ref >90)
Potassium: 3.6 mEq/L (ref 3.5–5.1)

## 2013-02-25 LAB — CBC WITH DIFFERENTIAL/PLATELET
Basophils Absolute: 0 10*3/uL (ref 0.0–0.1)
Basophils Relative: 0 % (ref 0–1)
Eosinophils Absolute: 0.3 10*3/uL (ref 0.0–0.7)
Eosinophils Relative: 4 % (ref 0–5)
HCT: 37.3 % — ABNORMAL LOW (ref 39.0–52.0)
MCH: 34.1 pg — ABNORMAL HIGH (ref 26.0–34.0)
MCHC: 33.8 g/dL (ref 30.0–36.0)
MCV: 101.1 fL — ABNORMAL HIGH (ref 78.0–100.0)
Monocytes Absolute: 0.8 10*3/uL (ref 0.1–1.0)
RDW: 15.3 % (ref 11.5–15.5)

## 2013-02-25 LAB — URINALYSIS, ROUTINE W REFLEX MICROSCOPIC
Bilirubin Urine: NEGATIVE
Glucose, UA: NEGATIVE mg/dL
Ketones, ur: NEGATIVE mg/dL
Leukocytes, UA: NEGATIVE
Protein, ur: NEGATIVE mg/dL

## 2013-02-25 MED ORDER — GABAPENTIN 400 MG PO CAPS: 800.0000 mg | ORAL_CAPSULE | Freq: Four times a day (QID) | ORAL | Status: DC

## 2013-02-25 MED ORDER — AMLODIPINE BESYLATE 10 MG PO TABS: 10.0000 mg | ORAL_TABLET | Freq: Every day | ORAL | Status: DC

## 2013-02-25 MED ORDER — TRAZODONE HCL 50 MG PO TABS: 50.0000 mg | ORAL_TABLET | Freq: Every evening | ORAL | Status: DC | PRN

## 2013-02-25 MED ORDER — CLOPIDOGREL BISULFATE 75 MG PO TABS: 75.0000 mg | ORAL_TABLET | Freq: Every day | ORAL | Status: DC

## 2013-02-25 MED ORDER — GABAPENTIN 800 MG PO TABS
800.0000 mg | ORAL_TABLET | Freq: Four times a day (QID) | ORAL | Status: DC
  Filled 2013-02-25: qty 54

## 2013-02-25 MED ORDER — CITALOPRAM HYDROBROMIDE 20 MG PO TABS: 20.0000 mg | ORAL_TABLET | Freq: Every day | ORAL | Status: DC

## 2013-02-25 NOTE — Progress Notes (Signed)
Patient ID: [name] [name], male   DOB: Jan 17, 1951, 62 y.o.   MRN: [id number] Writer reviewed pt discharge instructions with pt including medications, follow up care and crisis intervention. Pt acknowledged understanding of instructions and states that he has no reservations about leaving [facility] at this time. Pt denies SI/HI and AVH. Pt mood and affect are appropriate to the situation. Writer returned pt belongings from locker, and the pt is released into his own care into a taxi for transportation.

## 2013-02-25 NOTE — Progress Notes (Signed)
Patient ID: [name] [name], male   DOB: 04-12-1950, 62 y.o.   MRN: [id number]  Patient asleep; no s/s of distress noted. Respirations regular and unlabored.

## 2013-02-25 NOTE — Progress Notes (Signed)
[facility] Adult Case Management Discharge Plan :  Will you be returning to the same living situation after discharge: Yes,  pt was homeless prior to admission and continues to be homeless at discharge.  Pt was given the opportunity, on numerous occasions, to discuss shelter options, transitional team for assistance, the [facility] but pt declines everything.  At discharge, do you have transportation home?:Yes,  provided pt with a taxi voucher Do you have the ability to pay for your medications:Yes,  access to meds  Release of information consent forms completed and in the chart;  Patient's signature needed at discharge.  Patient to Follow up at: Follow-up Information   Follow up with [facility] On 02/27/2013. (Walk in on this date for hospital discharge appointment, for medication management and therapy. Walk in clinic is Monday - Friday 8 am - 3 pm.)    Contact information:   [address], [place] [address] Phone: [phone] Fax: [phone]      Patient denies SI/HI:   Yes,  denies SI/HI    Safety Planning and Suicide Prevention discussed:  Yes,  discussed with pt.  Pt refused consent to provide suicide prevention information with family/friend.  See suicide prevention education note.   Pt states that he will go to the ED to present for medical issues.  Pt does not participate in group discussion and is intrusive and disruptive to the therapeutic milieu.  Pt has reached maximum benefit of inpatient hospitalization here.    Carmina Miller 02/25/2013, 2:59 PM

## 2013-02-25 NOTE — Discharge Summary (Signed)
Physician Discharge Summary Note  Patient:  [name] [name] is an 62 y.o., male MRN:  [id number] DOB:  1950-11-25 Patient phone:  [phone] (home)  Patient address:   [address] [place] [address],   Date of Admission:  02/20/2013 Date of Discharge: 02/25/13  Reason for Admission:  Alcohol abuse, Depression   Discharge Diagnoses: Principal Problem:   Alcohol dependence Active Problems:   Major depressive disorder, recurrent episode, severe, without mention of psychotic behavior   Alcohol withdrawal  Review of Systems  Constitutional: Negative.   HENT: Negative.   Eyes: Negative.   Respiratory: Negative.   Cardiovascular: Negative.   Gastrointestinal: Negative.   Genitourinary: Negative.   Musculoskeletal: Positive for joint pain.  Skin: Negative.   Neurological: Negative.   Endo/Heme/Allergies: Negative.   Psychiatric/Behavioral: Positive for depression. Negative for suicidal ideas, hallucinations, memory loss and substance abuse. The patient is not nervous/anxious and does not have insomnia.     DSM5: Axis I: Alcohol Abuse and Major Depression, Recurrent severe  Axis II: Deferred  Axis III:  Past Medical History   Diagnosis  Date   .  Hypercholesteremia    .  Stroke    .  Hypertension    .  Alcohol abuse    .  Seizures    .  Depression    .  Homelessness     Axis IV: economic problems, housing problems, other psychosocial or environmental problems, problems related to social environment and problems with primary support group  Axis V: 41-50 serious symptoms  Level of Care:  OP  Hospital Course:  62 year old male with a history of alcohol abuse, chronic neuropathy and depression. He is here with a three-day history of suicidal ideation. He states that life has no purpose and everyone that was ever important to him is dead or lives out of town. He states we are only here to die. He has considered taking an overdose of pills. He admits to excessive  drinking, notably mouthwash; he admits to being intoxicated at the present time. One of the contributing factors to his suicidality is moderate to severe neuropathy causing pain and numbness in his arms and legs. He is having difficulty ambulating and has to use a walker to get around. He states when his Neurontin dose was 800 mg 3 times a day he was getting good relief but his dose was reduced to 400 mg 3 times a day.          [name] [name] was admitted to the adult unit. He was evaluated and his symptoms were identified. Medication management was discussed and initiated. He was oriented to the unit and encouraged to participate in unit programming. Medical problems were identified and treated appropriately. Home medication was restarted as needed.        The patient was evaluated each day by a clinical provider to ascertain the patient's response to treatment.  Improvement was noted by the patient's report of decreasing symptoms, improved sleep and appetite, affect, medication tolerance, behavior, and participation in unit programming.  [name] Hubbardwas asked each day to complete a self inventory noting mood, mental status, pain, new symptoms, anxiety and concerns.         He responded well to medication and being in a therapeutic and supportive environment. Positive and appropriate behavior was noted and the patient was motivated for recovery.  [name] Hubbardworked closely with the treatment team and case manager to develop a discharge plan with appropriate goals. Coping skills, problem solving  as well as relaxation therapies were also part of the unit programming.         By the day of discharge [name] [name] was in much improved condition than upon admission.  Symptoms were reported as significantly decreased or resolved completely.  The patient denied SI/HI and voiced no AVH. He was motivated to continue taking medication with a goal of continued improvement in mental health.           [name] [name] was discharged home with a plan to follow up as noted below.   Consults:  psychiatry  Significant Diagnostic Studies:  labs: Admission labs completed   Discharge Vitals:   Blood pressure 118/73, pulse 97, temperature 97.6 F (36.4 C), temperature source Oral, resp. rate 16, height 5\' 8"  (1.727 m), weight 72.576 kg (160 lb). Body mass index is 24.33 kg/(m^2). Lab Results:   No results found for this or any previous visit (from the past 72 hour(s)).  Physical Findings: AIMS: Facial and Oral Movements Muscles of Facial Expression: None, normal Lips and Perioral Area: None, normal Jaw: None, normal Tongue: None, normal,Extremity Movements Upper (arms, wrists, hands, fingers): None, normal Lower (legs, knees, ankles, toes): None, normal, Trunk Movements Neck, shoulders, hips: None, normal, Overall Severity Severity of abnormal movements (highest score from questions above): None, normal Incapacitation due to abnormal movements: None, normal Patient's awareness of abnormal movements (rate only patient's report): No Awareness, Dental Status Current problems with teeth and/or dentures?: No Does patient usually wear dentures?: No  CIWA:  CIWA-Ar Total: 0 COWS:  COWS Total Score: 3  Psychiatric Specialty Exam: See Psychiatric Specialty Exam and Suicide Risk Assessment completed by Attending Physician prior to discharge.  Discharge destination:  Home  Is patient on multiple antipsychotic therapies at discharge:  No   Has Patient had three or more failed trials of antipsychotic monotherapy by history:  No  Recommended Plan for Multiple Antipsychotic Therapies: NA  Discharge Orders   Future Orders Complete By Expires   Discharge instructions  As directed    Comments:     Follow up with your Primary Care Provider for further management of medical problems.       Medication List       Indication   amLODipine 10 MG tablet  Commonly known as:  NORVASC   Take 1 tablet (10 mg total) by mouth daily.   Indication:  High Blood Pressure     citalopram 20 MG tablet  Commonly known as:  CELEXA  Take 1 tablet (20 mg total) by mouth daily.   Indication:  Depression     clopidogrel 75 MG tablet  Commonly known as:  PLAVIX  Take 1 tablet (75 mg total) by mouth daily with breakfast.   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   Indication:  Nerve Pain, Neuropathic Pain, Pain, neuropathic pain     lisinopril 10 MG tablet  Commonly known as:  PRINIVIL,ZESTRIL  Take 1 tablet (10 mg total) by mouth daily. For high blood pressure control   Indication:  High Blood Pressure     traZODone 50 MG tablet  Commonly known as:  DESYREL  Take 1 tablet (50 mg total) by mouth at bedtime as needed for sleep.   Indication:  Trouble Sleeping           Follow-up Information  Follow up with [facility] On 02/27/2013. (Walk in on this date for hospital discharge appointment, for medication management and therapy. Walk in clinic is Monday - Friday 8 am - 3 pm.)    Contact information:   [address] [place] [address] Phone: [phone] Fax: [phone]      Follow-up recommendations:  Activity: As tolerated  Diet: Regular   Comments:   Take all your medications as prescribed by your mental healthcare provider.  Report any adverse effects and or reactions from your medicines to your outpatient provider promptly.  Patient is instructed and cautioned to not engage in alcohol and or illegal drug use while on prescription medicines.  In the event of worsening symptoms, patient is instructed to call the crisis hotline, 911 and or go to the nearest ED for appropriate evaluation and treatment of symptoms.  Follow-up with your primary care provider for your other medical issues, concerns and or health care needs.   Total  Discharge Time:  Greater than 30 minutes.  SignedFransisca Kaufmann NP-C 02/25/2013, 11:34 AM  The patient was seen for psychiatric evaluation, suicide risk assessment and formulated treatment plan, after discussing the case with the treatment team made disposition and treatment plan. Reviewed the information documented and agree with the treatment plan.  Alexandre Lightsey,JANARDHAHA R. 02/26/2013 1:30 PM

## 2013-02-25 NOTE — Progress Notes (Signed)
   CARE MANAGEMENT ED NOTE 02/25/2013  Patient:  [name] [name]   Account Number:  [id number]  Date Initiated:  02/25/2013  Documentation initiated by:  Radford Pax  Subjective/Objective Assessment:   Patient presents to ED with being unable to walk.  Patient was discharged from [facility] this afternoon.     Subjective/Objective Assessment Detail:     Action/Plan:   Action/Plan Detail:   Anticipated DC Date:       Status Recommendation to Physician:   Result of Recommendation:    Other ED Services  Consult Working Plan    DC Planning Services  OP Neuro Rehab  PCP issues    Choice offered to / List presented to:            Status of service:  Completed, signed off  ED Comments:   ED Comments Detail:  EDCM spoke to patient at bedside.  Patient reports he is homeless.  He states, "I do have income coming in.  824 dollars comes in a month.  I have been staying at a hotel. I've been trying to call my friend [name] [name] for my walker. It is a 300 dollar walker [facility] provided for me."  Patient states he is supposed to go to [facility].  Patient reports he has had a stroke in the past and his right leg drags when he walks.  The patient claims to be very unsteady when he walks.  Patient asked EDCM to look in the paper bag on the bedside table to see all the medications he is on.  "I'm going to overdose.  I'm going to take all of those medications all at once and end it all."  [facility] asked patient if he still felt like killing himself now.  Patient replied, "Yes I do."  Patient's EDRN made aware.  [facility] provided patient with a list of pcps who accept self pay patients, list of financial assistance in the community such as [employer] and local churches, urban ministries, and a list of local shelters if needed.  No further CM needs at this time.

## 2013-02-25 NOTE — ED Notes (Signed)
Per EMS report: pt was discharged from behavior health today at 15:00 and told [facility] to not discharge him because he cannot walk and has no place to go.  Pt was found at [facility] and told EMS that he walked around [place] to be use the restroom as well as walking to the [employer] to by mouthwash.  Pt called EMS d/t a "sudden new onset of not being able to walk."  On arrival to ED, pt reports being weak in both legs and falling multiples at [facility] and after discharge.  Pt alert.  Skin warm and dry.

## 2013-02-25 NOTE — ED Notes (Signed)
Bed: WU98 Expected date:  Expected time:  Means of arrival:  Comments: EMS 62yo M,  ETOH

## 2013-02-25 NOTE — BHH Suicide Risk Assessment (Signed)
Suicide Risk Assessment  Discharge Assessment     Demographic Factors:  Male, Age 62 or older, Caucasian, Low socioeconomic status, Unemployed and homeless  Mental Status Per Nursing Assessment::   On Admission:  Suicidal ideation indicated by patient;Suicide plan;Self-harm thoughts  Current Mental Status by Physician: Mental Status Examination: Patient appeared as per his stated age, casually dressed, and fairly groomed, and maintaining good eye contact. Patient has good mood and his affect was constricted. He has normal rate, rhythm, and volume of speech. His thought process is linear and goal directed. Patient has denied suicidal, homicidal ideations, intentions or plans. Patient has no evidence of auditory or visual hallucinations, delusions, and paranoia. Patient has fair insight judgment and impulse control.  Loss Factors: Decline in physical health and Financial problems/change in socioeconomic status  Historical Factors: Family history of mental illness or substance abuse and Impulsivity  Risk Reduction Factors:   Sense of responsibility to family, Religious beliefs about death and Positive coping skills or problem solving skills  Continued Clinical Symptoms:  Depression:   Recent sense of peace/wellbeing Alcohol/Substance Abuse/Dependencies Previous Psychiatric Diagnoses and Treatments Medical Diagnoses and Treatments/Surgeries  Cognitive Features That Contribute To Risk:  Polarized thinking    Suicide Risk:  Mild:  Suicidal ideation of limited frequency, intensity, duration, and specificity.  There are no identifiable plans, no associated intent, mild dysphoria and related symptoms, good self-control (both objective and subjective assessment), few other risk factors, and identifiable protective factors, including available and accessible social support.  Discharge Diagnoses:   AXIS I:  Substance Induced Mood Disorder and Alcohol dependence AXIS II:  Deferred AXIS III:    Past Medical History  Diagnosis Date  . Hypercholesteremia   . Stroke   . Hypertension   . Alcohol abuse   . Seizures   . Depression   . Homelessness    AXIS IV:  economic problems, housing problems, other psychosocial or environmental problems, problems related to social environment and problems with primary support group AXIS V:  51-60 moderate symptoms  Plan Of Care/Follow-up recommendations:  Activity:  As tolerated Diet:  Regular  Is patient on multiple antipsychotic therapies at discharge:  No   Has Patient had three or more failed trials of antipsychotic monotherapy by history:  No  Recommended Plan for Multiple Antipsychotic Therapies: NA  Nehemiah Settle., M.D. 02/25/2013, 12:12 PM

## 2013-02-25 NOTE — [facility] Group Notes (Signed)
[facility] LCSW Aftercare Discharge Planning Group Note   02/25/2013 8:45 AM  Participation Quality:  Alert, Appropriate and Oriented  Mood/Affect:  Irritable  Depression Rating:  10  Anxiety Rating:  10  Thoughts of Suicide:  Pt denies SI/HI - pt denies SI/HI, stating "not in the hospital"  Will you contract for safety?   Yes  Current AVH:  Pt denies  Plan for Discharge/Comments:  Pt attended discharge planning group and actively participated in group.  CSW provided pt with today's workbook.  Pt states that he is not doing well today.  Pt states that his discharge plans are to go to the street, drink and come back to the hospital, as he continues to do.  CSW offered pt to talk to Cathleen Fears, homeless liaison about the shelter but pt declined, stating that he will be more suicidal if he has to stay in a shelter.  CSW explained that we have offered pt everything we can, and he is unable to go back to any treatment center due to conflict there, such as at [facility] on last admission.  Pt is unable to go to [facility] due to being unable to walk.  Pt declines any further help, but then states no one is helping here.  Pt did agree to follow up at [facility] for medication management and therapy.  No further needs voiced by pt at this time.    Transportation Means: Pt denies access to transportation - CSW will assist  Supports: No supports mentioned at this time  Reyes Ivan, LCSW 02/25/2013 10:06 AM

## 2013-02-25 NOTE — Tx Team (Signed)
Interdisciplinary Treatment Plan Update (Adult)  Date: 02/25/2013  Time Reviewed:  9:45 AM  Progress in Treatment: Attending groups: Yes Participating in groups:  Yes Taking medication as prescribed:  Yes Tolerating medication:  Yes Family/Significant othe contact made: No, pt refused Patient understands diagnosis:  Yes Discussing patient identified problems/goals with staff:  Yes Medical problems stabilized or resolved:  Yes Denies suicidal/homicidal ideation: Yes Issues/concerns per patient self-inventory:  Yes Other:  New problem(s) identified: Pt presented in treatment team with concerns of medical issues, stating he can't walk.  Pt plans to go to [facility] to address these issues.  Pt felt like no one addressed his needs while here, despite CSW and [facility] transitional team meeting with pt numerous times to see how we could be of assistance to pt.  Pt declined all services and help.   Discharge Plan or Barriers: Pt will follow up at [facility] for medication management and therapy.    Reason for Continuation of Hospitalization: Stable to d/c today  Comments: N/A  Estimated length of stay: D/C today  For review of initial/current patient goals, please see plan of care.  Attendees: Patient:  [name] [name] 02/25/2013 10:30 AM   Family:     Physician:  Dr. Javier Glazier 02/25/2013 10:30 AM   Nursing:  Robbie Louis, RN 02/25/2013 10:30 AM   Clinical Social Worker:  Reyes Ivan, LCSW 02/25/2013 10:30 AM   Other: Harold Barban, RN 02/25/2013 10:30 AM   Other:  Elizbeth Squires, care coordination 02/25/2013 10:30 AM   Other:  Juline Patch, LCSW 02/25/2013 10:30 AM   Other:  Marzetta Board, RN 02/25/2013 10:31 AM   Other:    Other:    Other:    Other:    Other:      Scribe for Treatment Team:   Carmina Miller, 02/25/2013 , 10:30 AM

## 2013-02-25 NOTE — B[facility] Group Notes (Signed)
[facility] LCSW Group Therapy  02/25/2013  1:15 PM   Type of Therapy:  Group Therapy  Participation Level:  Active  Participation Quality:  Attentive, Sharing but Intrusive  Affect:  Irritable  Cognitive:  Alert and Oriented  Insight:  Limited, Lacking  Engagement in Therapy:  Limited, Lacking  Modes of Intervention:  Clarification, Confrontation, Discussion, Education, Exploration, Limit-setting, Orientation, Problem-solving, Rapport Building, Dance movement psychotherapist, Socialization and Support  Summary of Progress/Problems: The topic for group today was emotional regulation.  This group focused on both positive and negative emotion identification and allowed group members to process ways to identify feelings, regulate negative emotions, and find healthy ways to manage internal/external emotions. Group members were asked to reflect on a time when their reaction to an emotion led to a negative outcome and explored how alternative responses using emotion regulation would have benefited them. Group members were also asked to discuss a time when emotion regulation was utilized when a negative emotion was experienced.  Pt shared that the only answer to depression is suicide.  Pt participated in group discussion in regards to other ways to cope with depression, positively, instead of thinking the only answer is suicide.  Pt was pessimistic and shot down what peers tried to share with him.  Pt states that his only hobby is drinking.  CSW confronted pt on the fact that it appears pt doesn't want to get better and doesn't make an attempt to take the help that is offered to him.  Pt had to be redirected from interrupting peers.    [name] Ivan, LCSW 02/25/2013 2:16 PM

## 2013-02-25 NOTE — ED Provider Notes (Signed)
CSN: [id number]     Arrival date & time 02/25/13  2213 History   First MD Initiated Contact with Patient 02/25/13 2243     Chief Complaint  Patient presents with  . Extremity Weakness   HPI  History provided by the patient. Patient is a 62 year old male with history of hypertension, hypercholesterolemia, reports of previous CVA in 2010 with slight persistent right-sided weakness, heavy alcohol abuse and homelessness who presents with complaints of bilateral lower extremity weakness and recent falls. Patient was recently admitted to [facility] for alcohol detox. He spent 5 days there was discharged earlier today. Patient admits that he could not stay sober long and was at [employer] in the [employer] where he purchased mouthwash to drink. He states that while he was at she had 4 falls due to weakness and unsteadiness in his lower legs. He denies having any injuries from the falls or LOC. He began to have similar weakness and unsteadiness while at [facility] and fell. He was helped up and began to walk towards the bus stop when he began to have weakness again. EMS was called and he was brought to the emergency department. His last drink of alcohol was 3 hours ago. He denies any other complaints. Denies any headache, vision change or confusion. Denies any chest pain or shortness of breath. No recent fever, cough or cold.    Past Medical History  Diagnosis Date  . Hypercholesteremia   . Stroke   . Hypertension   . Alcohol abuse   . Seizures   . Depression   . Homelessness    History reviewed. No pertinent past surgical history. No family history on file. History  Substance Use Topics  . Smoking status: Current Every Day Smoker -- 0.25 packs/day    Types: Cigarettes  . Smokeless tobacco: Not on file  . Alcohol Use: Yes     Comment: drinks bottles of 2 pint  mouthwash daily    Review of Systems  Constitutional: Negative for fever.  Eyes: Negative for visual disturbance.  Respiratory: Negative  for shortness of breath.   Cardiovascular: Negative for chest pain.  Gastrointestinal: Negative for nausea, vomiting, diarrhea and constipation.  Musculoskeletal: Negative for back pain.  Neurological: Positive for weakness and numbness. Negative for headaches.  All other systems reviewed and are negative.    Allergies  Pollen extract  Home Medications   Current Outpatient Rx  Name  Route  Sig  Dispense  Refill  . amLODipine (NORVASC) 10 MG tablet   Oral   Take 1 tablet (10 mg total) by mouth daily.   30 tablet   0   . citalopram (CELEXA) 20 MG tablet   Oral   Take 1 tablet (20 mg total) by mouth daily.   30 tablet   0   . clopidogrel (PLAVIX) 75 MG tablet   Oral   Take 1 tablet (75 mg total) by mouth daily with breakfast.   30 tablet   0   . gabapentin (NEURONTIN) 400 MG capsule   Oral   Take 2 capsules (800 mg total) by mouth 4 (four) times daily.   240 capsule   0   . lisinopril (PRINIVIL,ZESTRIL) 10 MG tablet   Oral   Take 1 tablet (10 mg total) by mouth daily. For high blood pressure control   30 tablet   0   . traZODone (DESYREL) 50 MG tablet   Oral   Take 1 tablet (50 mg total) by mouth at bedtime  as needed for sleep.   30 tablet   0    BP 124/76  Temp(Src) 98.2 F (36.8 C) (Oral)  Resp 18  Ht 5\' 8"  (1.727 m)  Wt 160 lb (72.576 kg)  BMI 24.33 kg/m2  SpO2 96% Physical Exam  Nursing note and vitals reviewed. Constitutional: He is oriented to person, place, and time. He appears well-developed and well-nourished.  HENT:  Head: Normocephalic.  Eyes: Conjunctivae and EOM are normal. Pupils are equal, round, and reactive to light.  Cardiovascular: Normal rate and regular rhythm.   Pulmonary/Chest: Effort normal and breath sounds normal.  Abdominal: Soft.  Neurological: He is alert and oriented to person, place, and time. He has normal strength. No cranial nerve deficit or sensory deficit.  Reflex Scores:      Patellar reflexes are 2+ on the  right side and 2+ on the left side. Unsteady gait  Skin: Skin is warm.  Psychiatric: He has a normal mood and affect. His behavior is normal.    ED Course  Procedures   DIAGNOSTIC STUDIES: Oxygen Saturation is 96% on room air.    COORDINATION OF CARE:  Nursing notes reviewed. Vital signs reviewed. Initial pt interview and examination performed.   11:01 PM-patient seen and evaluated. He appears well in no acute distress. He is normal nonfocal neuro exam. He is able to stand but has unsteady gait. Discussed work up plan with pt at bedside, which includes labs. Pt agrees with plan.  Patient able to ambulate in the hallway but does require assistance. He is unsteady. Nurse reported to me that patient was seen by social services and may have mentioned possible depression and SI. I asked the patient directly and he denies any SI or HI. There was also concerns of him mentioning overdose. Patient denies any intentional overdose or thoughts of overdose. He does mentioned a week ago possibly taking trazodone while in a hotel room and remembers waking up on the floor. He denies any suicide attempt.  6:00AM pt discussed in sign out with [employer] PA-C.  Pt will attempt to call for a ride home and to have his walker brought to him.  He is otherwise able to be d/c home.  If unable to contact for a ride he may need social work consult.   Results for orders placed during the hospital encounter of 02/25/13  ETHANOL      Result Value Range   Alcohol, Ethyl (B) 95 (*) 0 - 11 mg/dL  CBC WITH DIFFERENTIAL      Result Value Range   WBC 7.4  4.0 - 10.5 K/uL   RBC 3.69 (*) 4.22 - 5.81 MIL/uL   Hemoglobin 12.6 (*) 13.0 - 17.0 g/dL   HCT 65.7 (*) 84.6 - 96.2 %   MCV 101.1 (*) 78.0 - 100.0 fL   MCH 34.1 (*) 26.0 - 34.0 pg   MCHC 33.8  30.0 - 36.0 g/dL   RDW 95.2  84.1 - 32.4 %   Platelets 176  150 - 400 K/uL   Neutrophils Relative % 68  43 - 77 %   Neutro Abs 5.1  1.7 - 7.7 K/uL   Lymphocytes Relative  18  12 - 46 %   Lymphs Abs 1.3  0.7 - 4.0 K/uL   Monocytes Relative 11  3 - 12 %   Monocytes Absolute 0.8  0.1 - 1.0 K/uL   Eosinophils Relative 4  0 - 5 %   Eosinophils Absolute 0.3  0.0 -  0.7 K/uL   Basophils Relative 0  0 - 1 %   Basophils Absolute 0.0  0.0 - 0.1 K/uL  COMPREHENSIVE METABOLIC PANEL      Result Value Range   Sodium 137  135 - 145 mEq/L   Potassium 3.6  3.5 - 5.1 mEq/L   Chloride 95 (*) 96 - 112 mEq/L   CO2 25  19 - 32 mEq/L   Glucose, Bld 85  70 - 99 mg/dL   BUN 14  6 - 23 mg/dL   Creatinine, Ser 1.61  0.50 - 1.35 mg/dL   Calcium 9.3  8.4 - 09.6 mg/dL   Total Protein 7.8  6.0 - 8.3 g/dL   Albumin 3.9  3.5 - 5.2 g/dL   AST 67 (*) 0 - 37 U/L   ALT 63 (*) 0 - 53 U/L   Alkaline Phosphatase 72  39 - 117 U/L   Total Bilirubin 0.1 (*) 0.3 - 1.2 mg/dL   GFR calc non Af Amer 87 (*) >90 mL/min   GFR calc Af Amer >90  >90 mL/min  MAGNESIUM      Result Value Range   Magnesium 2.4  1.5 - 2.5 mg/dL  URINALYSIS, ROUTINE W REFLEX MICROSCOPIC      Result Value Range   Color, Urine YELLOW  YELLOW   APPearance CLEAR  CLEAR   Specific Gravity, Urine 1.005  1.005 - 1.030   pH 5.5  5.0 - 8.0   Glucose, UA NEGATIVE  NEGATIVE mg/dL   Hgb urine dipstick NEGATIVE  NEGATIVE   Bilirubin Urine NEGATIVE  NEGATIVE   Ketones, ur NEGATIVE  NEGATIVE mg/dL   Protein, ur NEGATIVE  NEGATIVE mg/dL   Urobilinogen, UA 0.2  0.0 - 1.0 mg/dL   Nitrite NEGATIVE  NEGATIVE   Leukocytes, UA NEGATIVE  NEGATIVE      Imaging Review Ct Head Wo Contrast  02/26/2013   CLINICAL DATA:  Sudden onset of lower extremity weakness.  EXAM: CT HEAD WITHOUT CONTRAST  TECHNIQUE: Contiguous axial images were obtained from the base of the skull through the vertex without intravenous contrast.  COMPARISON:  CT of the head performed 11/04/2012  FINDINGS: There is no evidence of acute infarction, mass lesion, or intra- or extra-axial hemorrhage on CT.  Prominence of the ventricles and sulci reflects mild cortical  volume loss. Mild cerebellar atrophy is noted. Scattered periventricular white matter change likely reflects small vessel ischemic microangiopathy. A chronic lacunar infarct is noted at the left mid corona radiata.  The brainstem and fourth ventricle are within normal limits. The basal ganglia are unremarkable in appearance. The cerebral hemispheres demonstrate grossly normal gray-white differentiation. No mass effect or midline shift is seen.  There is no evidence of fracture; visualized osseous structures are unremarkable in appearance. The orbits are within normal limits. The paranasal sinuses and mastoid air cells are well-aerated. No significant soft tissue abnormalities are seen.  IMPRESSION: 1. No acute intracranial pathology seen on CT. 2. Mild cortical volume loss and scattered small vessel ischemic microangiopathy. 3. Chronic lacunar infarct at the left mid corona radiata.   Electronically Signed   By: Roanna Raider M.D.   On: 02/26/2013 02:54      MDM   1. Weakness   2. Alcohol dependence   3. Peripheral neuropathy        Angus Seller, PA-C 02/26/13 0600

## 2013-02-26 ENCOUNTER — Encounter (HOSPITAL_COMMUNITY): Payer: Self-pay | Admitting: Emergency Medicine

## 2013-02-26 ENCOUNTER — Emergency Department (HOSPITAL_COMMUNITY)
Admission: EM | Admit: 2013-02-26 | Discharge: 2013-02-26 | Disposition: A | Discharge status: Home / Self Care | Payer: No Typology Code available for payment source | Attending: Emergency Medicine | Admitting: Emergency Medicine

## 2013-02-26 ENCOUNTER — Emergency Department (HOSPITAL_COMMUNITY): Payer: No Typology Code available for payment source

## 2013-02-26 DIAGNOSIS — Z862 Personal history of diseases of the blood and blood-forming organs and certain disorders involving the immune mechanism: Secondary | ICD-10-CM | POA: Insufficient documentation

## 2013-02-26 DIAGNOSIS — F17200 Nicotine dependence, unspecified, uncomplicated: Secondary | ICD-10-CM | POA: Insufficient documentation

## 2013-02-26 DIAGNOSIS — Z79899 Other long term (current) drug therapy: Secondary | ICD-10-CM | POA: Insufficient documentation

## 2013-02-26 DIAGNOSIS — Z8639 Personal history of other endocrine, nutritional and metabolic disease: Secondary | ICD-10-CM | POA: Insufficient documentation

## 2013-02-26 DIAGNOSIS — F10929 Alcohol use, unspecified with intoxication, unspecified: Secondary | ICD-10-CM

## 2013-02-26 DIAGNOSIS — F3289 Other specified depressive episodes: Secondary | ICD-10-CM | POA: Insufficient documentation

## 2013-02-26 DIAGNOSIS — R531 Weakness: Secondary | ICD-10-CM

## 2013-02-26 DIAGNOSIS — I10 Essential (primary) hypertension: Secondary | ICD-10-CM | POA: Insufficient documentation

## 2013-02-26 DIAGNOSIS — F329 Major depressive disorder, single episode, unspecified: Secondary | ICD-10-CM | POA: Insufficient documentation

## 2013-02-26 DIAGNOSIS — Z8673 Personal history of transient ischemic attack (TIA), and cerebral infarction without residual deficits: Secondary | ICD-10-CM | POA: Insufficient documentation

## 2013-02-26 DIAGNOSIS — G40909 Epilepsy, unspecified, not intractable, without status epilepticus: Secondary | ICD-10-CM | POA: Insufficient documentation

## 2013-02-26 DIAGNOSIS — Z7902 Long term (current) use of antithrombotics/antiplatelets: Secondary | ICD-10-CM | POA: Insufficient documentation

## 2013-02-26 DIAGNOSIS — F1010 Alcohol abuse, uncomplicated: Secondary | ICD-10-CM | POA: Insufficient documentation

## 2013-02-26 DIAGNOSIS — R5381 Other malaise: Secondary | ICD-10-CM | POA: Insufficient documentation

## 2013-02-26 LAB — BASIC METABOLIC PANEL
BUN: 13 mg/dL (ref 6–23)
Chloride: 99 mEq/L (ref 96–112)
GFR calc Af Amer: >90 mL/min (ref >90)
Potassium: 4.2 mEq/L (ref 3.5–5.1)
Sodium: 137 mEq/L (ref 135–145)

## 2013-02-26 LAB — CBC
HCT: 36.6 % — ABNORMAL LOW (ref 39.0–52.0)
MCH: 33.5 pg (ref 26.0–34.0)
Platelets: 199 10*3/uL (ref 150–400)
RDW: 15.4 % (ref 11.5–15.5)
WBC: 5 10*3/uL (ref 4.0–10.5)

## 2013-02-26 LAB — ETHANOL: Alcohol, Ethyl (B): 168 mg/dL — ABNORMAL HIGH (ref 0–11)

## 2013-02-26 MED ORDER — VITAMIN B-1 100 MG PO TABS
100.0000 mg | ORAL_TABLET | Freq: Every day | ORAL | Status: DC
  Administered 2013-02-26: 100 mg via ORAL
  Filled 2013-02-26: qty 1

## 2013-02-26 MED ORDER — LORAZEPAM 1 MG PO TABS
1.0000 mg | ORAL_TABLET | Freq: Once | ORAL | Status: AC
  Administered 2013-02-26: 1 mg via ORAL
  Filled 2013-02-26: qty 1

## 2013-02-26 MED ORDER — LORAZEPAM 1 MG PO TABS: 0.0000 mg | ORAL_TABLET | Freq: Four times a day (QID) | ORAL | Status: DC

## 2013-02-26 MED ORDER — THIAMINE HCL 100 MG/ML IJ SOLN: 100.0000 mg | Freq: Every day | INTRAMUSCULAR | Status: DC

## 2013-02-26 MED ORDER — LORAZEPAM 1 MG PO TABS: 0.0000 mg | ORAL_TABLET | Freq: Two times a day (BID) | ORAL | Status: DC

## 2013-02-26 NOTE — ED Provider Notes (Signed)
CSN: [id number]     Arrival date & time 02/26/13  1644 History   First MD Initiated Contact with Patient 02/26/13 1728     Chief Complaint  Patient presents with  . Alcohol Intoxication   (Consider location/radiation/quality/duration/timing/severity/associated sxs/prior Treatment) HPI Pt presenting with c/o feeling weak in his legs.  This is a chronic problem.  He walks with a walker which he has been using.  No change in the weakness. He was discharged earlier today- he states that after he was discharged he drank alcohol.  He also states that he called EMS because he still didn't know why his legs were weak.  He states he wonders if he need to take potassium supplements.  There is no change in his symptoms.  No fever, no trauma, no fainting.  He has been walking as per his usual with a walker.  There are no other associated systemic symptoms, there are no other alleviating or modifying factors.   Past Medical History  Diagnosis Date  . Hypercholesteremia   . Stroke   . Hypertension   . Alcohol abuse   . Seizures   . Depression   . Homelessness    History reviewed. No pertinent past surgical history. No family history on file. History  Substance Use Topics  . Smoking status: Current Every Day Smoker -- 0.25 packs/day    Types: Cigarettes  . Smokeless tobacco: Not on file  . Alcohol Use: Yes     Comment: drinks bottles of 2 pint  mouthwash daily    Review of Systems ROS reviewed and all otherwise negative except for mentioned in HPI  Allergies  Pollen extract  Home Medications   Current Outpatient Rx  Name  Route  Sig  Dispense  Refill  . amLODipine (NORVASC) 10 MG tablet   Oral   Take 1 tablet (10 mg total) by mouth daily.   30 tablet   0   . citalopram (CELEXA) 20 MG tablet   Oral   Take 1 tablet (20 mg total) by mouth daily.   30 tablet   0   . clopidogrel (PLAVIX) 75 MG tablet   Oral   Take 1 tablet (75 mg total) by mouth daily with breakfast.   30  tablet   0   . gabapentin (NEURONTIN) 400 MG capsule   Oral   Take 2 capsules (800 mg total) by mouth 4 (four) times daily.   240 capsule   0   . lisinopril (PRINIVIL,ZESTRIL) 10 MG tablet   Oral   Take 1 tablet (10 mg total) by mouth daily. For high blood pressure control   30 tablet   0   . traZODone (DESYREL) 50 MG tablet   Oral   Take 1 tablet (50 mg total) by mouth at bedtime as needed for sleep.   30 tablet   0    BP 93/57  Pulse 72  Temp(Src) 97.6 F (36.4 C) (Oral)  Resp 20  SpO2 94% Vitals reviewed Physical Exam Physical Examination: General appearance - alert, well appearing, and in no distress Mental status - alert, oriented to person, place, and time Eyes - no conjunctival injection, no scleral icterus Mouth - mucous membranes moist, pharynx normal without lesions Chest - clear to auscultation, no wheezes, rales or rhonchi, symmetric air entry Heart - normal rate, regular rhythm, normal S1, S2, no murmurs, rubs, clicks or gallops Abdomen - soft, nontender, nondistended, no masses or organomegaly Neurological - alert, oriented, normal speech, 5/5 strength  in lower extremities bilaterally, slow gait, no tremor Extremities - peripheral pulses normal, no pedal edema, no clubbing or cyanosis Skin - normal coloration and turgor, no rashes Psych- calm and cooperative  ED Course  Procedures (including critical care time) Labs Review Labs Reviewed  CBC - Abnormal; Notable for the following:    RBC 3.64 (*)    Hemoglobin 12.2 (*)    HCT 36.6 (*)    MCV 100.5 (*)    All other components within normal limits  BASIC METABOLIC PANEL - Abnormal; Notable for the following:    GFR calc non Af Amer 90 (*)    All other components within normal limits  ETHANOL - Abnormal; Notable for the following:    Alcohol, Ethyl (B) 168 (*)    All other components within normal limits   Imaging Review Ct Head Wo Contrast  02/26/2013   CLINICAL DATA:  Sudden onset of lower  extremity weakness.  EXAM: CT HEAD WITHOUT CONTRAST  TECHNIQUE: Contiguous axial images were obtained from the base of the skull through the vertex without intravenous contrast.  COMPARISON:  CT of the head performed 11/04/2012  FINDINGS: There is no evidence of acute infarction, mass lesion, or intra- or extra-axial hemorrhage on CT.  Prominence of the ventricles and sulci reflects mild cortical volume loss. Mild cerebellar atrophy is noted. Scattered periventricular white matter change likely reflects small vessel ischemic microangiopathy. A chronic lacunar infarct is noted at the left mid corona radiata.  The brainstem and fourth ventricle are within normal limits. The basal ganglia are unremarkable in appearance. The cerebral hemispheres demonstrate grossly normal gray-white differentiation. No mass effect or midline shift is seen.  There is no evidence of fracture; visualized osseous structures are unremarkable in appearance. The orbits are within normal limits. The paranasal sinuses and mastoid air cells are well-aerated. No significant soft tissue abnormalities are seen.  IMPRESSION: 1. No acute intracranial pathology seen on CT. 2. Mild cortical volume loss and scattered small vessel ischemic microangiopathy. 3. Chronic lacunar infarct at the left mid corona radiata.   Electronically Signed   By: Roanna Raider M.D.   On: 02/26/2013 02:54    EKG Interpretation   None       MDM   1. Alcohol intoxication   2. Generalized weakness    Pt presenting several hours after discharge this morning, he has been drinking alcohol and has continued c/o leg weakness which per chart review appears chronic and unchanged.  Labs rechecked and reassuring.  D/w patient that alcohol consumption would contribute to sensation of unsteady gait and weakness.     Ethelda Chick, MD 02/26/13 2008

## 2013-02-26 NOTE — ED Notes (Signed)
Pt unsteady on feet but is able to walk with a stand-by assist. Earlier this evening, case management informed this writer that pt reports SI but pt has continued to deny SI to Clinical research associate and to [place], [place].

## 2013-02-26 NOTE — ED Notes (Signed)
Pt able to ambulate to restroom with a stand by assist.  Pt called friend up who will bring pt's walker around 10:00 and 11:00.

## 2013-02-26 NOTE — ED Provider Notes (Signed)
Patient examined while in the emergency department with physician assistant, as chronic ataxia, is supposed to walk with a walker but refuses to do this, he has frequent falls related to his ataxia which is thought to be related to peripheral neuropathy resulting from his chronic alcohol use. The patient even states that this is not a new problem for him. I have watched him ambulate in the hallways, he does require minimal assistance, there is no signs of external injury, the patient is stable to be discharged into the care of a friend who he states as his walker.  Medical screening examination/treatment/procedure(s) were conducted as a shared visit with non-physician practitioner(s) and myself.  I personally evaluated the patient during the encounter.       Vida Roller, MD 02/26/13 [phone]

## 2013-02-26 NOTE — ED Notes (Signed)
Per EMS: pt coming from [employer] with c/o leg pain/weakness. ETOH on board. Pt states SI with no plan.

## 2013-02-26 NOTE — Progress Notes (Signed)
[facility] provided pt with a list of primary care resources, highlighting [facility]. Patient stated that he did not like the [facility] and said that he used to have a [employer] with [facility] [place]. Provided pt with [facility] at [facility] contact information, so they can help patient re-enroll into the [facility] [employer].

## 2013-02-26 NOTE — ED Provider Notes (Signed)
Medical screening examination/treatment/procedure(s) were conducted as a shared visit with non-physician practitioner(s) and myself.  I personally evaluated the patient during the encounter  Please see my separate respective documentation pertaining to this patient encounter   Vida Roller, MD 02/26/13 2326

## 2013-02-26 NOTE — ED Notes (Signed)
Pt up to restroom assisted by pt's walker

## 2013-02-26 NOTE — ED Provider Notes (Signed)
6:00 - Patient care assumed from [facility], PA-C at shift change. Patient presented to ED today for subjective weakness. He has been ambulated in ED without difficulty when holding on to an individual. Patient with a walker at home for ambulation. Plan to d/c patient after he is able to call his friend and have him bring his walker to the ED.   8:00 - Went in to speak with the patient. He states his friend "can only bring his walker between 10 and 11 AM today". As I am speaking with the patient he ambulates to the sink to wash his hands without difficulty. He does not appear to be unsteady. Patient hemodynamically stable at this time.  10:50 - Patient out front. Patient stable and appropriate for d/c. Resource guide provided. Patient ambulates to waiting room with walker without difficulty.  Antony Madura, PA-C 02/26/13 1051

## 2013-03-02 ENCOUNTER — Encounter (HOSPITAL_COMMUNITY): Payer: Self-pay | Admitting: Emergency Medicine

## 2013-03-02 ENCOUNTER — Emergency Department (HOSPITAL_COMMUNITY)
Admission: EM | Admit: 2013-03-02 | Discharge: 2013-03-04 | Disposition: A | Discharge status: Home / Self Care | Payer: No Typology Code available for payment source | Attending: Emergency Medicine | Admitting: Emergency Medicine

## 2013-03-02 DIAGNOSIS — Y9229 Other specified public building as the place of occurrence of the external cause: Secondary | ICD-10-CM | POA: Insufficient documentation

## 2013-03-02 DIAGNOSIS — W19XXXA Unspecified fall, initial encounter: Secondary | ICD-10-CM | POA: Insufficient documentation

## 2013-03-02 DIAGNOSIS — Z79899 Other long term (current) drug therapy: Secondary | ICD-10-CM | POA: Insufficient documentation

## 2013-03-02 DIAGNOSIS — Z7902 Long term (current) use of antithrombotics/antiplatelets: Secondary | ICD-10-CM | POA: Insufficient documentation

## 2013-03-02 DIAGNOSIS — Z862 Personal history of diseases of the blood and blood-forming organs and certain disorders involving the immune mechanism: Secondary | ICD-10-CM | POA: Insufficient documentation

## 2013-03-02 DIAGNOSIS — Z8639 Personal history of other endocrine, nutritional and metabolic disease: Secondary | ICD-10-CM | POA: Insufficient documentation

## 2013-03-02 DIAGNOSIS — I10 Essential (primary) hypertension: Secondary | ICD-10-CM | POA: Insufficient documentation

## 2013-03-02 DIAGNOSIS — Y939 Activity, unspecified: Secondary | ICD-10-CM | POA: Insufficient documentation

## 2013-03-02 DIAGNOSIS — F1020 Alcohol dependence, uncomplicated: Secondary | ICD-10-CM

## 2013-03-02 DIAGNOSIS — S8990XA Unspecified injury of unspecified lower leg, initial encounter: Secondary | ICD-10-CM | POA: Insufficient documentation

## 2013-03-02 DIAGNOSIS — F10239 Alcohol dependence with withdrawal, unspecified: Secondary | ICD-10-CM | POA: Insufficient documentation

## 2013-03-02 DIAGNOSIS — R45851 Suicidal ideations: Secondary | ICD-10-CM | POA: Insufficient documentation

## 2013-03-02 DIAGNOSIS — R5381 Other malaise: Secondary | ICD-10-CM | POA: Insufficient documentation

## 2013-03-02 DIAGNOSIS — Z5900 Homelessness unspecified: Secondary | ICD-10-CM | POA: Insufficient documentation

## 2013-03-02 DIAGNOSIS — Z8673 Personal history of transient ischemic attack (TIA), and cerebral infarction without residual deficits: Secondary | ICD-10-CM | POA: Insufficient documentation

## 2013-03-02 DIAGNOSIS — F332 Major depressive disorder, recurrent severe without psychotic features: Secondary | ICD-10-CM | POA: Insufficient documentation

## 2013-03-02 DIAGNOSIS — F17200 Nicotine dependence, unspecified, uncomplicated: Secondary | ICD-10-CM | POA: Insufficient documentation

## 2013-03-02 DIAGNOSIS — F10939 Alcohol use, unspecified with withdrawal, unspecified: Secondary | ICD-10-CM | POA: Insufficient documentation

## 2013-03-02 DIAGNOSIS — R269 Unspecified abnormalities of gait and mobility: Secondary | ICD-10-CM | POA: Insufficient documentation

## 2013-03-02 LAB — COMPREHENSIVE METABOLIC PANEL
ALT: 37 U/L (ref 0–53)
AST: 34 U/L (ref 0–37)
Albumin: 4 g/dL (ref 3.5–5.2)
Alkaline Phosphatase: 91 U/L (ref 39–117)
BUN: 9 mg/dL (ref 6–23)
CO2: 20 mEq/L (ref 19–32)
Calcium: 9 mg/dL (ref 8.4–10.5)
Chloride: 97 mEq/L (ref 96–112)
Creatinine, Ser: 0.82 mg/dL (ref 0.50–1.35)
GFR calc Af Amer: >90 mL/min (ref >90)
GFR calc non Af Amer: >90 mL/min (ref >90)
Glucose, Bld: 92 mg/dL (ref 70–99)
Potassium: 4.4 mEq/L (ref 3.5–5.1)
Sodium: 135 mEq/L (ref 135–145)
Total Bilirubin: 0.3 mg/dL (ref 0.3–1.2)
Total Protein: 7.8 g/dL (ref 6.0–8.3)

## 2013-03-02 LAB — CBC
HCT: 38.2 % — ABNORMAL LOW (ref 39.0–52.0)
Hemoglobin: 12.8 g/dL — ABNORMAL LOW (ref 13.0–17.0)
MCH: 33.4 pg (ref 26.0–34.0)
MCHC: 33.5 g/dL (ref 30.0–36.0)
MCV: 99.7 fL (ref 78.0–100.0)
Platelets: 310 10*3/uL (ref 150–400)
RBC: 3.83 MIL/uL — ABNORMAL LOW (ref 4.22–5.81)
RDW: 15.8 % — ABNORMAL HIGH (ref 11.5–15.5)
WBC: 7 10*3/uL (ref 4.0–10.5)

## 2013-03-02 LAB — ACETAMINOPHEN LEVEL: Acetaminophen (Tylenol), Serum: <15 ug/mL (ref 10–30)

## 2013-03-02 LAB — ETHANOL: Alcohol, Ethyl (B): 319 mg/dL — ABNORMAL HIGH (ref 0–11)

## 2013-03-02 LAB — SALICYLATE LEVEL: Salicylate Lvl: 2.1 mg/dL — ABNORMAL LOW (ref 2.8–20.0)

## 2013-03-02 LAB — D-DIMER, QUANTITATIVE: D-Dimer, Quant: 2.15 ug/mL-FEU — ABNORMAL HIGH (ref 0.00–0.48)

## 2013-03-02 MED ORDER — LORAZEPAM 1 MG PO TABS
1.0000 mg | ORAL_TABLET | Freq: Three times a day (TID) | ORAL | Status: DC | PRN
  Administered 2013-03-03: 1 mg via ORAL
  Filled 2013-03-02: qty 1

## 2013-03-02 MED ORDER — NICOTINE 21 MG/24HR TD PT24
21.0000 mg | MEDICATED_PATCH | Freq: Every day | TRANSDERMAL | Status: DC
  Administered 2013-03-02 – 2013-03-04 (×3): 21 mg via TRANSDERMAL
  Filled 2013-03-02 (×3): qty 1

## 2013-03-02 MED ORDER — CLOPIDOGREL BISULFATE 75 MG PO TABS
75.0000 mg | ORAL_TABLET | Freq: Every day | ORAL | Status: DC
  Administered 2013-03-03 – 2013-03-04 (×2): 75 mg via ORAL
  Filled 2013-03-02 (×3): qty 1

## 2013-03-02 MED ORDER — ZOLPIDEM TARTRATE 5 MG PO TABS: 5.0000 mg | ORAL_TABLET | Freq: Every evening | ORAL | Status: DC | PRN

## 2013-03-02 MED ORDER — ENOXAPARIN SODIUM 80 MG/0.8ML ~~LOC~~ SOLN
1.0000 mg/kg | Freq: Once | SUBCUTANEOUS | Status: AC
  Administered 2013-03-03: 02:00:00 75 mg via SUBCUTANEOUS
  Filled 2013-03-02: qty 0.8

## 2013-03-02 MED ORDER — AMLODIPINE BESYLATE 10 MG PO TABS
10.0000 mg | ORAL_TABLET | Freq: Every day | ORAL | Status: DC
  Administered 2013-03-03 – 2013-03-04 (×2): 10 mg via ORAL
  Filled 2013-03-02 (×2): qty 1

## 2013-03-02 MED ORDER — LISINOPRIL 10 MG PO TABS
10.0000 mg | ORAL_TABLET | Freq: Every day | ORAL | Status: DC
  Administered 2013-03-03 – 2013-03-04 (×2): 10 mg via ORAL
  Filled 2013-03-02 (×2): qty 1

## 2013-03-02 MED ORDER — GABAPENTIN 400 MG PO CAPS
800.0000 mg | ORAL_CAPSULE | Freq: Four times a day (QID) | ORAL | Status: DC
  Administered 2013-03-03 – 2013-03-04 (×5): 800 mg via ORAL
  Filled 2013-03-02 (×9): qty 2

## 2013-03-02 MED ORDER — ONDANSETRON HCL 4 MG PO TABS: 4.0000 mg | ORAL_TABLET | Freq: Three times a day (TID) | ORAL | Status: DC | PRN

## 2013-03-02 MED ORDER — ACETAMINOPHEN 325 MG PO TABS: 650.0000 mg | ORAL_TABLET | ORAL | Status: DC | PRN

## 2013-03-02 MED ORDER — ALUM & MAG HYDROXIDE-SIMETH 200-200-20 MG/5ML PO SUSP: 30.0000 mL | ORAL | Status: DC | PRN

## 2013-03-02 MED ORDER — IBUPROFEN 200 MG PO TABS: 600.0000 mg | ORAL_TABLET | Freq: Three times a day (TID) | ORAL | Status: DC | PRN

## 2013-03-02 NOTE — Progress Notes (Signed)
Patient Discharge Instructions:  After Visit Summary (AVS):   Faxed to:  03/02/13 Discharge Summary Note:   Faxed to:  03/02/13 Psychiatric Admission Assessment Note:   Faxed to:  03/02/13 Suicide Risk Assessment - Discharge Assessment:   Faxed to:  03/02/13 Faxed/Sent to the Next Level Care provider:  03/02/13 Faxed to [facility] @ [phone]  Jerelene Redden, 03/02/2013, 3:49 PM

## 2013-03-02 NOTE — ED Provider Notes (Signed)
CSN: [id number]     Arrival date & time 03/02/13  1839 History   First MD Initiated Contact with Patient 03/02/13 1845     Chief Complaint  Patient presents with  . Weakness  . Leg Pain  . Medical Clearance   (Consider location/radiation/quality/duration/timing/severity/associated sxs/prior Treatment) HPI Pt is a 62yo male with hx of stroke, HTN. Seizures, alcohol abuse, and depression, BIB EMS after fall in [employer] earlier today, did not hit head, no LOC.  Pt states he has neuropathy and took his neurontin which did help his left leg feel better but states he has sharp right calf pain, 8/10, constant that started 2-3 days ago. Pt also states "I have nothing to live for" States he has been suicidal for several days now and plans on taking all of his medication at once. Has been to [facility] and [facility] in the past but states they cannot help him.  Pt is not HI. Admits to drinking "too much" about 1 quart of liquor a day.  Denies fever, n/v/d.   Past Medical History  Diagnosis Date  . Hypercholesteremia   . Stroke   . Hypertension   . Alcohol abuse   . Seizures   . Depression   . Homelessness    History reviewed. No pertinent past surgical history. History reviewed. No pertinent family history. History  Substance Use Topics  . Smoking status: Current Every Day Smoker -- 0.25 packs/day    Types: Cigarettes  . Smokeless tobacco: Not on file  . Alcohol Use: Yes     Comment: drinks bottles of 2 pint  mouthwash daily    Review of Systems  Constitutional: Positive for fatigue. Negative for fever and chills.  Gastrointestinal: Negative for nausea, vomiting, abdominal pain and diarrhea.  Musculoskeletal: Positive for gait problem and myalgias ( right calf). Negative for joint swelling.  Skin: Negative for color change and rash.  Neurological: Positive for weakness and numbness. Negative for tremors and syncope.  Psychiatric/Behavioral: Positive for suicidal ideas. Negative for  hallucinations, behavioral problems, confusion, self-injury and agitation.  All other systems reviewed and are negative.    Allergies  Pollen extract  Home Medications   Current Outpatient Rx  Name  Route  Sig  Dispense  Refill  . amLODipine (NORVASC) 10 MG tablet   Oral   Take 1 tablet (10 mg total) by mouth daily.   30 tablet   0   . clopidogrel (PLAVIX) 75 MG tablet   Oral   Take 1 tablet (75 mg total) by mouth daily with breakfast.   30 tablet   0   . gabapentin (NEURONTIN) 400 MG capsule   Oral   Take 2 capsules (800 mg total) by mouth 4 (four) times daily.   240 capsule   0   . lisinopril (PRINIVIL,ZESTRIL) 10 MG tablet   Oral   Take 1 tablet (10 mg total) by mouth daily. For high blood pressure control   30 tablet   0    BP 120/81  Pulse 76  Temp(Src) 97.3 F (36.3 C) (Oral)  Resp 16  Ht 5\' 8"  (1.727 m)  Wt 160 lb (72.576 kg)  BMI 24.33 kg/m2  SpO2 92% Physical Exam  Nursing note and vitals reviewed. Constitutional: He appears well-developed and well-nourished.  Pt lying comfortably in exam bed, NAD.   HENT:  Head: Normocephalic and atraumatic.  Eyes: Conjunctivae are normal. No scleral icterus.  Neck: Normal range of motion.  Cardiovascular: Normal rate, regular rhythm and normal  heart sounds.   Pulmonary/Chest: Effort normal and breath sounds normal. No respiratory distress. He has no wheezes. He has no rales. He exhibits no tenderness.  Abdominal: Soft. Bowel sounds are normal. He exhibits no distension and no mass. There is no tenderness. There is no rebound and no guarding.  Musculoskeletal: Normal range of motion. He exhibits tenderness ( right calf). He exhibits no edema.  Mild tenderness in right calf, no edema, ecchymosis or erythema.   Neurological: He is alert.  Skin: Skin is warm and dry.  Psychiatric: His speech is normal and behavior is normal. He exhibits a depressed mood. He expresses suicidal ideation. He expresses no homicidal  ideation. He expresses suicidal plans ( "I would just take all of my pills"). He expresses no homicidal plans.    ED Course  Procedures (including critical care time) Labs Review Labs Reviewed  CBC - Abnormal; Notable for the following:    RBC 3.83 (*)    Hemoglobin 12.8 (*)    HCT 38.2 (*)    RDW 15.8 (*)    All other components within normal limits  ETHANOL - Abnormal; Notable for the following:    Alcohol, Ethyl (B) 319 (*)    All other components within normal limits  SALICYLATE LEVEL - Abnormal; Notable for the following:    Salicylate Lvl 2.1 (*)    All other components within normal limits  D-DIMER, QUANTITATIVE - Abnormal; Notable for the following:    D-Dimer, Quant 2.15 (*)    All other components within normal limits  ACETAMINOPHEN LEVEL  COMPREHENSIVE METABOLIC PANEL  URINE RAPID DRUG SCREEN (HOSP PERFORMED)   Imaging Review No results found.  EKG Interpretation   None       MDM  No diagnosis found.  Pt is a 62yo male with hx of weakness, depression, alcohol dependence presenting today with leg weakness and pain, concern for DVT in right leg due to pt's hx of stroke. Will give venous ultrasound.    Will also consult TTS as pt has SI, stating he plans to take all of his medication at once.  Denies HI.  Pt has walker to get around.    Venous U/S not available in ED at this time.  D-dimer performed due to suspicion for DVT.   D-dimer: 2.15, will tx empirically for DVT and order venous U/S for a.m.  Assessment from TTS still pending.   12:59 AM Signed out to Dr. Dierdre Highman at shift change. Plan is to have pt be evaluated by TTS and get venous doppler for likely DVT.        Junius Finner, PA-C 03/03/13 0100

## 2013-03-02 NOTE — BH Assessment (Signed)
Consulted with AC Thurman Coyer and Donell Sievert who are requesting that patient be assessed by Psychiatrist tomorrow morning as patient was recently D/C from ER on 02-26-13 and possible malingering suspected.   Glorious Peach, MS, LCASA  Assessment Counselor

## 2013-03-02 NOTE — ED Notes (Signed)
Pt states that his legs are weak and painful. Took neurontin and states that it is feeling better. Fell in [place]. Did not hit head or LOC. Pt also states that he is suicidal and wants to take all his meds at once.

## 2013-03-02 NOTE — ED Notes (Signed)
Bed: WA03 Expected date: 03/02/13 Expected time: 6:36 PM Means of arrival:  Comments: EMS

## 2013-03-03 ENCOUNTER — Encounter (HOSPITAL_COMMUNITY): Payer: Self-pay | Admitting: Registered Nurse

## 2013-03-03 DIAGNOSIS — F10239 Alcohol dependence with withdrawal, unspecified: Secondary | ICD-10-CM

## 2013-03-03 DIAGNOSIS — F1020 Alcohol dependence, uncomplicated: Secondary | ICD-10-CM

## 2013-03-03 LAB — RAPID URINE DRUG SCREEN, HOSP PERFORMED
Amphetamines: NOT DETECTED
Barbiturates: NOT DETECTED
Benzodiazepines: POSITIVE — AB
Cocaine: NOT DETECTED
Opiates: NOT DETECTED
Tetrahydrocannabinol: NOT DETECTED

## 2013-03-03 MED ORDER — CHLORDIAZEPOXIDE HCL 25 MG PO CAPS
25.0000 mg | ORAL_CAPSULE | Freq: Four times a day (QID) | ORAL | Status: DC | PRN
  Filled 2013-03-03: qty 1

## 2013-03-03 MED ORDER — CHLORDIAZEPOXIDE HCL 25 MG PO CAPS: 25.0000 mg | ORAL_CAPSULE | Freq: Every day | ORAL | Status: DC

## 2013-03-03 MED ORDER — HYDROXYZINE HCL 25 MG PO TABS: 25.0000 mg | ORAL_TABLET | Freq: Four times a day (QID) | ORAL | Status: DC | PRN

## 2013-03-03 MED ORDER — LOPERAMIDE HCL 2 MG PO CAPS: 2.0000 mg | ORAL_CAPSULE | ORAL | Status: DC | PRN

## 2013-03-03 MED ORDER — ADULT MULTIVITAMIN W/MINERALS CH
1.0000 | ORAL_TABLET | Freq: Every day | ORAL | Status: DC
  Administered 2013-03-03 – 2013-03-04 (×2): 1 via ORAL
  Filled 2013-03-03 (×2): qty 1

## 2013-03-03 MED ORDER — ONDANSETRON 4 MG PO TBDP: 4.0000 mg | ORAL_TABLET | Freq: Four times a day (QID) | ORAL | Status: DC | PRN

## 2013-03-03 MED ORDER — CHLORDIAZEPOXIDE HCL 25 MG PO CAPS
25.0000 mg | ORAL_CAPSULE | Freq: Once | ORAL | Status: AC
  Administered 2013-03-03: 25 mg via ORAL
  Filled 2013-03-03: qty 1

## 2013-03-03 MED ORDER — CHLORDIAZEPOXIDE HCL 25 MG PO CAPS
25.0000 mg | ORAL_CAPSULE | Freq: Four times a day (QID) | ORAL | Status: DC
  Administered 2013-03-03 – 2013-03-04 (×4): 25 mg via ORAL
  Filled 2013-03-03 (×3): qty 1

## 2013-03-03 MED ORDER — CHLORDIAZEPOXIDE HCL 25 MG PO CAPS: 25.0000 mg | ORAL_CAPSULE | ORAL | Status: DC

## 2013-03-03 MED ORDER — CHLORDIAZEPOXIDE HCL 25 MG PO CAPS: 25.0000 mg | ORAL_CAPSULE | Freq: Three times a day (TID) | ORAL | Status: DC

## 2013-03-03 NOTE — Progress Notes (Signed)
ANTICOAGULATION CONSULT NOTE - Initial Consult  Pharmacy Consult for Enoxaparin Indication: DVT  Allergies  Allergen Reactions  . Pollen Extract     Patient Measurements: Height: 5\' 8"  (172.7 cm) Weight: 160 lb (72.576 kg) IBW/kg (Calculated) : 68.4 Heparin Dosing Weight:   Vital Signs: Temp: 97.3 F (36.3 C) (12/15 2233) Temp src: Oral (12/15 2233) BP: 107/63 mmHg (12/16 0135) Pulse Rate: 77 (12/16 0135)  Labs:  Recent Labs  03/02/13 1945  HGB 12.8*  HCT 38.2*  PLT 310  CREATININE 0.82    Estimated Creatinine Clearance: 90.4 ml/min (by C-G formula based on Cr of 0.82).   Medical History: Past Medical History  Diagnosis Date  . Hypercholesteremia   . Stroke   . Hypertension   . Alcohol abuse   . Seizures   . Depression   . Homelessness     Medications:  Scheduled:  . amLODipine  10 mg Oral Daily  . clopidogrel  75 mg Oral Q breakfast  . gabapentin  800 mg Oral QID  . lisinopril  10 mg Oral Daily  . nicotine  21 mg Transdermal Daily    Assessment: Patient with possible DVT.  PA wishes for one dose of lovenox for now until dopplers completed.  Goal of Therapy:  Anti-Xa level 0.6-1 units/ml 4hrs after LMWH dose given    Plan:  Dosed 1mg /kg = 75mg  sq enoxaparin x1 MD/PA to reorder lovenox if needed further than one dose.  Darlina Guys, Jacquenette Shone Crowford 03/03/2013,6:19 AM

## 2013-03-03 NOTE — Progress Notes (Signed)
1610 03-03-13 [name] [name] denied the patient based on the MD recommendation of detox and the facility does not offer detox treatment.

## 2013-03-03 NOTE — Progress Notes (Signed)
Pre request of Ava, [facility] ED, the following facilities have been contacted or a referral has been sent for review:  [facility] per [facility] Moh per [facility] per [facility] [place] follow-up with bed availability; writer was placed on hold for more than 10 mins
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
## 2013-03-03 NOTE — Progress Notes (Addendum)
Writer consulted with the Psychiatrist and the NP [facility] regarding the patient meeting criteria for inpatient hospitalization to a 300 Hall Bed.   Writer has been declined to [facility].  MHT (Tia) will send out referrals.

## 2013-03-03 NOTE — Consult Note (Signed)
[facility] Face-to-Face Psychiatry Consult   Reason for Consult:  Alcoholism and depression  Referring Physician:  EDP  [name] [name] is an 62 y.o. male.  Assessment: AXIS I:  Alcohol Abuse and Major Depressive Disorder AXIS II:  Deferred AXIS III:   Past Medical History  Diagnosis Date  . Hypercholesteremia   . Stroke   . Hypertension   . Alcohol abuse   . Seizures   . Depression   . Homelessness    AXIS IV:  other psychosocial or environmental problems, problems related to social environment and problems with primary support group AXIS V:  41-50 serious symptoms  Plan:  Recommend psychiatric Inpatient admission when medically cleared.  Subjective:   [name] [name] is a 62 y.o. male.  HPI:  Patient states that he came to the hospital because of frequent falls.  Patient also states "I stay depressed; every since my mother died, I have no one.  I have no reason to live.  I am so depressed that I don't even brush my teeth; comb my hair.  I don't want to kill my self but I'm not doing anything to live either.  I have nothing to live for.  I have not family that I am close to.  I am going to keep drinking.  I need something that can help me stay away form the alcohol.  I know if I go back out I will be back to doing the same thing." Patient denies homicidal ideation, psychosis, and paranoia.  Patient does endorse passive suicidal ideation.   HPI Elements:   Location:  [facility]. Quality:  Affecting patient mentally and psychally. Severity:  Alcoholism.  Past Psychiatric History: Past Medical History  Diagnosis Date  . Hypercholesteremia   . Stroke   . Hypertension   . Alcohol abuse   . Seizures   . Depression   . Homelessness     reports that he has been smoking Cigarettes.  He has been smoking about 0.25 packs per day. He does not have any smokeless tobacco history on file. He reports that he drinks alcohol. He reports that he does not use illicit  drugs. History reviewed. No pertinent family history.         Allergies:   Allergies  Allergen Reactions  . Pollen Extract     ACT Assessment Complete:  No:   Past Psychiatric History: Diagnosis:  Alcoholism and Major depressive disorder  Hospitalizations:  Yes  Outpatient Care:  Denies  Substance Abuse Care:  Alcohol  Self-Mutilation:  Denies  Suicidal Attempts:  In past  Homicidal Behaviors:  Denies   Violent Behaviors:  Denies   Place of Residence:  [place] Marital Status:  Divorced Employed/Unemployed:  Disability Education:   Family Supports:  None Objective: Blood pressure 104/65, pulse 74, temperature 98.5 F (36.9 C), temperature source Oral, resp. rate 18, height 5\' 8"  (1.727 m), weight 72.576 kg (160 lb), SpO2 96.00%.Body mass index is 24.33 kg/(m^2). Results for orders placed during the hospital encounter of 03/02/13 (from the past 72 hour(s))  ACETAMINOPHEN LEVEL     Status: None   Collection Time    03/02/13  7:45 PM      Result Value Range   Acetaminophen (Tylenol), Serum <15.0  10 - 30 ug/mL   Comment:            THERAPEUTIC CONCENTRATIONS VARY     SIGNIFICANTLY. A RANGE OF 10-30     ug/mL MAY BE AN EFFECTIVE  CONCENTRATION FOR MANY PATIENTS.     HOWEVER, SOME ARE BEST TREATED     AT CONCENTRATIONS OUTSIDE THIS     RANGE.     ACETAMINOPHEN CONCENTRATIONS     >150 ug/mL AT 4 HOURS AFTER     INGESTION AND >50 ug/mL AT 12     HOURS AFTER INGESTION ARE     OFTEN ASSOCIATED WITH TOXIC     REACTIONS.  CBC     Status: Abnormal   Collection Time    03/02/13  7:45 PM      Result Value Range   WBC 7.0  4.0 - 10.5 K/uL   RBC 3.83 (*) 4.22 - 5.81 MIL/uL   Hemoglobin 12.8 (*) 13.0 - 17.0 g/dL   HCT 40.9 (*) 81.1 - 91.4 %   MCV 99.7  78.0 - 100.0 fL   MCH 33.4  26.0 - 34.0 pg   MCHC 33.5  30.0 - 36.0 g/dL   RDW 78.2 (*) 95.6 - 21.3 %   Platelets 310  150 - 400 K/uL  COMPREHENSIVE METABOLIC PANEL     Status: None   Collection Time    03/02/13   7:45 PM      Result Value Range   Sodium 135  135 - 145 mEq/L   Potassium 4.4  3.5 - 5.1 mEq/L   Chloride 97  96 - 112 mEq/L   CO2 20  19 - 32 mEq/L   Glucose, Bld 92  70 - 99 mg/dL   BUN 9  6 - 23 mg/dL   Creatinine, Ser 0.86  0.50 - 1.35 mg/dL   Calcium 9.0  8.4 - 57.8 mg/dL   Total Protein 7.8  6.0 - 8.3 g/dL   Albumin 4.0  3.5 - 5.2 g/dL   AST 34  0 - 37 U/L   Comment: SLIGHT HEMOLYSIS     HEMOLYSIS AT THIS LEVEL MAY AFFECT RESULT   ALT 37  0 - 53 U/L   Alkaline Phosphatase 91  39 - 117 U/L   Total Bilirubin 0.3  0.3 - 1.2 mg/dL   GFR calc non Af Amer >90  >90 mL/min   GFR calc Af Amer >90  >90 mL/min   Comment: (NOTE)     The eGFR has been calculated using the CKD EPI equation.     This calculation has not been validated in all clinical situations.     eGFR's persistently <90 mL/min signify possible Chronic Kidney     Disease.  ETHANOL     Status: Abnormal   Collection Time    03/02/13  7:45 PM      Result Value Range   Alcohol, Ethyl (B) 319 (*) 0 - 11 mg/dL   Comment:            LOWEST DETECTABLE LIMIT FOR     SERUM ALCOHOL IS 11 mg/dL     FOR MEDICAL PURPOSES ONLY  SALICYLATE LEVEL     Status: Abnormal   Collection Time    03/02/13  7:45 PM      Result Value Range   Salicylate Lvl 2.1 (*) 2.8 - 20.0 mg/dL  D-DIMER, QUANTITATIVE     Status: Abnormal   Collection Time    03/02/13 10:10 PM      Result Value Range   D-Dimer, Quant 2.15 (*) 0.00 - 0.48 ug/mL-FEU   Comment:            AT THE INHOUSE ESTABLISHED CUTOFF     VALUE  OF 0.48 ug/mL FEU,     THIS ASSAY HAS BEEN DOCUMENTED     IN THE LITERATURE TO HAVE     A SENSITIVITY AND NEGATIVE     PREDICTIVE VALUE OF AT LEAST     98 TO 99%.  THE TEST RESULT     SHOULD BE CORRELATED WITH     AN ASSESSMENT OF THE CLINICAL     PROBABILITY OF DVT / VTE.  URINE RAPID DRUG SCREEN (HOSP PERFORMED)     Status: Abnormal   Collection Time    03/03/13 12:43 AM      Result Value Range   Opiates NONE DETECTED  NONE  DETECTED   Cocaine NONE DETECTED  NONE DETECTED   Benzodiazepines POSITIVE (*) NONE DETECTED   Amphetamines NONE DETECTED  NONE DETECTED   Tetrahydrocannabinol NONE DETECTED  NONE DETECTED   Barbiturates NONE DETECTED  NONE DETECTED   Comment:            DRUG SCREEN FOR MEDICAL PURPOSES     ONLY.  IF CONFIRMATION IS NEEDED     FOR ANY PURPOSE, NOTIFY LAB     WITHIN 5 DAYS.                LOWEST DETECTABLE LIMITS     FOR URINE DRUG SCREEN     Drug Class       Cutoff (ng/mL)     Amphetamine      1000     Barbiturate      200     Benzodiazepine   200     Tricyclics       300     Opiates          300     Cocaine          300     THC              50     Current Facility-Administered Medications  Medication Dose Route Frequency Provider Last Rate Last Dose  . acetaminophen (TYLENOL) tablet 650 mg  650 mg Oral Q4H PRN Junius Finner, PA-C      . alum & mag hydroxide-simeth (MAALOX/MYLANTA) 200-200-20 MG/5ML suspension 30 mL  30 mL Oral PRN Junius Finner, PA-C      . amLODipine (NORVASC) tablet 10 mg  10 mg Oral Daily Junius Finner, PA-C   10 mg at 03/03/13 1610  . clopidogrel (PLAVIX) tablet 75 mg  75 mg Oral Q breakfast Junius Finner, PA-C   75 mg at 03/03/13 0729  . gabapentin (NEURONTIN) capsule 800 mg  800 mg Oral QID Junius Finner, PA-C   800 mg at 03/03/13 1307  . ibuprofen (ADVIL,MOTRIN) tablet 600 mg  600 mg Oral Q8H PRN Junius Finner, PA-C      . lisinopril (PRINIVIL,ZESTRIL) tablet 10 mg  10 mg Oral Daily Junius Finner, PA-C   10 mg at 03/03/13 0920  . LORazepam (ATIVAN) tablet 1 mg  1 mg Oral Q8H PRN Junius Finner, PA-C   1 mg at 03/03/13 1306  . nicotine (NICODERM CQ - dosed in mg/24 hours) patch 21 mg  21 mg Transdermal Daily Junius Finner, PA-C   21 mg at 03/03/13 0917  . ondansetron (ZOFRAN) tablet 4 mg  4 mg Oral Q8H PRN Junius Finner, PA-C      . zolpidem (AMBIEN) tablet 5 mg  5 mg Oral QHS PRN Junius Finner, PA-C       Current Outpatient Prescriptions  Medication Sig  Dispense Refill  . amLODipine (NORVASC) 10 MG tablet Take 1 tablet (10 mg total) by mouth daily.  30 tablet  0  . clopidogrel (PLAVIX) 75 MG tablet Take 1 tablet (75 mg total) by mouth daily with breakfast.  30 tablet  0  . gabapentin (NEURONTIN) 400 MG capsule Take 2 capsules (800 mg total) by mouth 4 (four) times daily.  240 capsule  0  . lisinopril (PRINIVIL,ZESTRIL) 10 MG tablet Take 1 tablet (10 mg total) by mouth daily. For high blood pressure control  30 tablet  0    Psychiatric Specialty Exam:     Blood pressure 104/65, pulse 74, temperature 98.5 F (36.9 C), temperature source Oral, resp. rate 18, height 5\' 8"  (1.727 m), weight 72.576 kg (160 lb), SpO2 96.00%.Body mass index is 24.33 kg/(m^2).  General Appearance: Disheveled  Eye Contact::  Good  Speech:  Clear and Coherent and Normal Rate  Volume:  Normal  Mood:  Anxious, Depressed and Hopeless  Affect:  Congruent, Depressed and Flat  Thought Process:  Circumstantial  Orientation:  Full (Time, Place, and Person)  Thought Content:  Rumination  Suicidal Thoughts:  Yes.  without intent/plan  Homicidal Thoughts:  No  Memory:  Immediate;   Good Recent;   Good  Judgement:  Impaired  Insight:  Lacking  Psychomotor Activity:  Tremor  Concentration:  Fair  Recall:  Good  Akathisia:  No  Handed:  Right  AIMS (if indicated):     Assets:  Communication Skills  Sleep:      Face to face consult/interview with Dr. Ladona Ridgel  Treatment Plan Summary: Daily contact with patient to assess and evaluate symptoms and progress in treatment Medication management  Disposition:  Inpatient treatment recommended.  Start Librium protocol.  Monitor for safety and stabilization until inpatient bed is located.  Assunta Found, FNP-BC 03/03/2013 2:38 PM

## 2013-03-03 NOTE — ED Notes (Signed)
Walked with all of pts belongings are in conference room beside of room 5

## 2013-03-03 NOTE — Progress Notes (Signed)
Writer sent referral to [facility] per [place] and [place] per [place].

## 2013-03-03 NOTE — Consult Note (Signed)
Note reviewed and agreed with  

## 2013-03-03 NOTE — Progress Notes (Signed)
Per Morrie Sheldon of [facility] patient was denied due to medical acuity. Oncoming [facility] will follow-up with placement.

## 2013-03-03 NOTE — Progress Notes (Signed)
*  Preliminary Results* Right lower extremity venous duplex completed. Right lower extremity is negative for deep vein thrombosis. There is no evidence of right Baker's cyst.  Preliminary results discussed with Dr.Steinl.  03/03/2013 9:05 AM  Gertie Fey, RVT, RDCS, RDMS

## 2013-03-04 DIAGNOSIS — F332 Major depressive disorder, recurrent severe without psychotic features: Secondary | ICD-10-CM

## 2013-03-04 MED ORDER — FLUOXETINE HCL 20 MG PO CAPS: 20.0000 mg | ORAL_CAPSULE | Freq: Every day | ORAL | Status: DC

## 2013-03-04 MED ORDER — GABAPENTIN 400 MG PO CAPS: 800.0000 mg | ORAL_CAPSULE | Freq: Four times a day (QID) | ORAL | Status: DC

## 2013-03-04 MED ORDER — LORAZEPAM 2 MG/ML IJ SOLN: 0.5000 mg | INTRAMUSCULAR | Status: DC | PRN

## 2013-03-04 MED ORDER — AMLODIPINE BESYLATE 10 MG PO TABS: 10.0000 mg | ORAL_TABLET | Freq: Every day | ORAL | Status: DC

## 2013-03-04 MED ORDER — FLUOXETINE HCL 20 MG PO CAPS
20.0000 mg | ORAL_CAPSULE | Freq: Every day | ORAL | Status: DC
  Filled 2013-03-04: qty 1

## 2013-03-04 MED ORDER — LORAZEPAM BOLUS VIA INFUSION: 0.5000 mg | INTRAVENOUS | Status: DC | PRN

## 2013-03-04 MED ORDER — LISINOPRIL 10 MG PO TABS: 10.0000 mg | ORAL_TABLET | Freq: Every day | ORAL | Status: DC

## 2013-03-04 MED ORDER — ADULT MULTIVITAMIN W/MINERALS CH: 1.0000 | ORAL_TABLET | Freq: Every day | ORAL | Status: DC

## 2013-03-04 MED ORDER — CLOPIDOGREL BISULFATE 75 MG PO TABS: 75.0000 mg | ORAL_TABLET | Freq: Every day | ORAL | Status: DC

## 2013-03-04 NOTE — Progress Notes (Addendum)
Pt accepted to [facility] away from home group home. Patient alf will assist with pt medicaid application. Patient assigned to be be evaluated for level II pasarr which will be completed at the assisted living. CSW awaiting to hear from [place] must evaluator, and will provide the address of [address] [place], [place]. Pt to be transported by alf.  Catha Gosselin, LCSW [phone]  ED CSW .03/04/2013 1406pm

## 2013-03-04 NOTE — ED Provider Notes (Signed)
Pt has been seen by psychiatry today who feel he can be discharged. He states he is living in a motel, he has had a stroke and uses a walker. He was feeling SI when he came in but states he is feeling better now. Social Work has found him a ALF placement today.   Devoria Albe, MD, Armando Gang   Ward Givens, MD 03/04/13 [phone]

## 2013-03-04 NOTE — ED Notes (Signed)
Took pt a cup of coffee 

## 2013-03-04 NOTE — Consult Note (Signed)
  Psychiatric Specialty Exam: Physical Exam  ROS  Blood pressure 130/82, pulse 85, temperature 97.7 F (36.5 C), temperature source Oral, resp. rate 18, height 5\' 8"  (1.727 m), weight 72.576 kg (160 lb), SpO2 95.00%.Body mass index is 24.33 kg/(m^2).  General Appearance: Well Groomed  Patent attorney::  Good  Speech:  Clear and Coherent  Volume:  Normal  Mood:  Depressed  Affect:  Appropriate  Thought Process:  Goal Directed and Logical  Orientation:  Full (Time, Place, and Person)  Thought Content:  Negative  Suicidal Thoughts:  No  Homicidal Thoughts:  No  Memory:  Immediate;   Good Recent;   Good Remote;   Good  Judgement:  Good  Insight:  Fair  Psychomotor Activity:  walks with a walker  Concentration:  Good  Recall:  Good  Akathisia:  Negative  Handed:  Right  AIMS (if indicated):     Assets:  Communication Skills  Sleep:   good   Mr Kawabata is sober and less hopeless.  He would like to go to assisted living but he does not have Medicaid.  The plan is to discharge him home today with the recommendation of pursuing Medicaid.

## 2013-03-05 ENCOUNTER — Emergency Department (HOSPITAL_COMMUNITY)
Admission: EM | Admit: 2013-03-05 | Discharge: 2013-03-05 | Disposition: A | Discharge status: Home / Self Care | Payer: No Typology Code available for payment source | Attending: Emergency Medicine | Admitting: Emergency Medicine

## 2013-03-05 ENCOUNTER — Encounter (HOSPITAL_COMMUNITY): Payer: Self-pay | Admitting: Emergency Medicine

## 2013-03-05 ENCOUNTER — Emergency Department (HOSPITAL_COMMUNITY): Payer: No Typology Code available for payment source

## 2013-03-05 DIAGNOSIS — F17200 Nicotine dependence, unspecified, uncomplicated: Secondary | ICD-10-CM | POA: Insufficient documentation

## 2013-03-05 DIAGNOSIS — Y9229 Other specified public building as the place of occurrence of the external cause: Secondary | ICD-10-CM | POA: Insufficient documentation

## 2013-03-05 DIAGNOSIS — F1010 Alcohol abuse, uncomplicated: Secondary | ICD-10-CM | POA: Insufficient documentation

## 2013-03-05 DIAGNOSIS — S0100XA Unspecified open wound of scalp, initial encounter: Secondary | ICD-10-CM | POA: Insufficient documentation

## 2013-03-05 DIAGNOSIS — I10 Essential (primary) hypertension: Secondary | ICD-10-CM | POA: Insufficient documentation

## 2013-03-05 DIAGNOSIS — Z79899 Other long term (current) drug therapy: Secondary | ICD-10-CM | POA: Insufficient documentation

## 2013-03-05 DIAGNOSIS — Y9389 Activity, other specified: Secondary | ICD-10-CM | POA: Insufficient documentation

## 2013-03-05 DIAGNOSIS — F10929 Alcohol use, unspecified with intoxication, unspecified: Secondary | ICD-10-CM

## 2013-03-05 DIAGNOSIS — Z5900 Homelessness unspecified: Secondary | ICD-10-CM | POA: Insufficient documentation

## 2013-03-05 DIAGNOSIS — W1809XA Striking against other object with subsequent fall, initial encounter: Secondary | ICD-10-CM | POA: Insufficient documentation

## 2013-03-05 DIAGNOSIS — Z7902 Long term (current) use of antithrombotics/antiplatelets: Secondary | ICD-10-CM | POA: Insufficient documentation

## 2013-03-05 DIAGNOSIS — Z8639 Personal history of other endocrine, nutritional and metabolic disease: Secondary | ICD-10-CM | POA: Insufficient documentation

## 2013-03-05 DIAGNOSIS — S0101XA Laceration without foreign body of scalp, initial encounter: Secondary | ICD-10-CM

## 2013-03-05 DIAGNOSIS — F329 Major depressive disorder, single episode, unspecified: Secondary | ICD-10-CM | POA: Insufficient documentation

## 2013-03-05 DIAGNOSIS — Z8673 Personal history of transient ischemic attack (TIA), and cerebral infarction without residual deficits: Secondary | ICD-10-CM | POA: Insufficient documentation

## 2013-03-05 DIAGNOSIS — F3289 Other specified depressive episodes: Secondary | ICD-10-CM | POA: Insufficient documentation

## 2013-03-05 DIAGNOSIS — Z862 Personal history of diseases of the blood and blood-forming organs and certain disorders involving the immune mechanism: Secondary | ICD-10-CM | POA: Insufficient documentation

## 2013-03-05 DIAGNOSIS — G40909 Epilepsy, unspecified, not intractable, without status epilepticus: Secondary | ICD-10-CM | POA: Insufficient documentation

## 2013-03-05 LAB — BASIC METABOLIC PANEL
BUN: 10 mg/dL (ref 6–23)
Creatinine, Ser: 0.93 mg/dL (ref 0.50–1.35)
GFR calc Af Amer: >90 mL/min (ref >90)
GFR calc non Af Amer: 88 mL/min — ABNORMAL LOW (ref >90)
Potassium: 3.9 mEq/L (ref 3.5–5.1)

## 2013-03-05 LAB — ETHANOL: Alcohol, Ethyl (B): 146 mg/dL — ABNORMAL HIGH (ref 0–11)

## 2013-03-05 LAB — CBC
MCHC: 33.1 g/dL (ref 30.0–36.0)
MCV: 101.5 fL — ABNORMAL HIGH (ref 78.0–100.0)
Platelets: 271 10*3/uL (ref 150–400)
RDW: 15.6 % — ABNORMAL HIGH (ref 11.5–15.5)
WBC: 4.9 10*3/uL (ref 4.0–10.5)

## 2013-03-05 MED ORDER — ACETAMINOPHEN 325 MG PO TABS
650.0000 mg | ORAL_TABLET | Freq: Once | ORAL | Status: AC
Start: 2013-03-05 — End: 2013-03-05
  Administered 2013-03-05: 650 mg via ORAL
  Filled 2013-03-05: qty 2

## 2013-03-05 NOTE — ED Provider Notes (Signed)
CSN: [id number]     Arrival date & time 03/05/13  1436 History   First MD Initiated Contact with Patient 03/05/13 1537     Chief Complaint  Patient presents with  . Fall  . Alcohol Intoxication    HPI Patient reports drinking mouthwash today and reports falling backwards at a local store and hitting his head.  He presents with a laceration to his posterior scalp.  He denies neck injury or neck pain.  He denies weakness in his arms or legs.  He has no chest pain shortness of breath.  No abdominal pain.  No other complaints.  His pain is mild in severity located around his head.   Past Medical History  Diagnosis Date  . Hypercholesteremia   . Stroke   . Hypertension   . Alcohol abuse   . Seizures   . Depression   . Homelessness    History reviewed. No pertinent past surgical history. History reviewed. No pertinent family history. History  Substance Use Topics  . Smoking status: Current Every Day Smoker -- 0.25 packs/day    Types: Cigarettes  . Smokeless tobacco: Not on file  . Alcohol Use: Yes     Comment: drinks bottles of 2 pint  mouthwash daily    Review of Systems  All other systems reviewed and are negative.    Allergies  Pollen extract  Home Medications   Current Outpatient Rx  Name  Route  Sig  Dispense  Refill  . amLODipine (NORVASC) 10 MG tablet   Oral   Take 10 mg by mouth daily.         . clopidogrel (PLAVIX) 75 MG tablet   Oral   Take 75 mg by mouth daily with breakfast.         . gabapentin (NEURONTIN) 400 MG capsule   Oral   Take 800 mg by mouth 4 (four) times daily.         Marland Kitchen lisinopril (PRINIVIL,ZESTRIL) 10 MG tablet   Oral   Take 10 mg by mouth daily.         . Multiple Vitamin (MULTIVITAMIN WITH MINERALS) TABS tablet   Oral   Take 1 tablet by mouth daily.   30 tablet   1   . FLUoxetine (PROZAC) 20 MG capsule   Oral   Take 1 capsule (20 mg total) by mouth daily.   30 capsule   1    BP 100/63  Pulse 92  Temp(Src) 97.6  F (36.4 C) (Oral)  Resp 16  SpO2 98% Physical Exam  Nursing note and vitals reviewed. Constitutional: He is oriented to person, place, and time. He appears well-developed and well-nourished.  HENT:  Head: Normocephalic and atraumatic.  Posterior scalp laceration without active bleeding.  3 cm.  Eyes: EOM are normal.  Neck: Normal range of motion.  C-spine nontender.  Cardiovascular: Normal rate, regular rhythm, normal heart sounds and intact distal pulses.   Pulmonary/Chest: Effort normal and breath sounds normal. No respiratory distress.  Abdominal: Soft. He exhibits no distension. There is no tenderness.  Musculoskeletal: Normal range of motion.  5 Out of 5 strength in bilateral upper lower extremity major muscle groups.  Neurological: He is alert and oriented to person, place, and time.  Skin: Skin is warm and dry.  Psychiatric: He has a normal mood and affect. Judgment normal.    ED Course  Procedures (including critical care time)  LACERATION REPAIR Performed by: Lyanne Co Consent: Verbal consent obtained. Risks and  benefits: risks, benefits and alternatives were discussed Patient identity confirmed: provided demographic data Time out performed prior to procedure Prepped and Draped in normal sterile fashion Wound explored Laceration Location: Posterior scalp Laceration Length: 3 cm No Foreign Bodies seen or palpated Anesthesia: None  Irrigation method: syringe Amount of cleaning: standard Skin closure: Staple  Number of sutures or staples: 5  Technique: Staple  Patient tolerance: Patient tolerated the procedure well with no immediate complications.   Labs Review Labs Reviewed  CBC - Abnormal; Notable for the following:    RBC 3.93 (*)    MCV 101.5 (*)    RDW 15.6 (*)    All other components within normal limits  ETHANOL - Abnormal; Notable for the following:    Alcohol, Ethyl (B) 146 (*)    All other components within normal limits  BASIC METABOLIC  PANEL - Abnormal; Notable for the following:    GFR calc non Af Amer 88 (*)    All other components within normal limits   Imaging Review Ct Head Wo Contrast  03/05/2013   CLINICAL DATA:  Larey Seat and hit the back of the head  EXAM: CT HEAD WITHOUT CONTRAST  TECHNIQUE: Contiguous axial images were obtained from the base of the skull through the vertex without intravenous contrast.  COMPARISON:  02/26/2013  FINDINGS: There is no evidence of mass effect, midline shift, or extra-axial fluid collections. There is no evidence of a space-occupying lesion or intracranial hemorrhage. There is no evidence of a cortical-based area of acute infarction. There is generalized cerebral atrophy. There is periventricular white matter low attenuation likely secondary to microangiopathy.  The ventricles and sulci are appropriate for the patient's age. The basal cisterns are patent.  Visualized portions of the orbits are unremarkable. The visualized portions of the paranasal sinuses and mastoid air cells are unremarkable. Cerebrovascular atherosclerotic calcifications are noted.  The osseous structures are unremarkable.  IMPRESSION: No acute intracranial pathology.   Electronically Signed   By: Elige Ko   On: 03/05/2013 16:29  I personally reviewed the imaging tests through PACS system I reviewed available ER/hospitalization records through the EMR   EKG Interpretation   None       MDM   1. Alcohol intoxication   2. Scalp laceration, initial encounter    Clinically ambulating around the emergency apartment without difficulty.  Alcohol abuse.  No indication for inpatient treatment.  Laceration repaired.  CT head normal.  C-spine cleared clinically    Lyanne Co, MD 03/05/13 [phone]

## 2013-03-05 NOTE — Progress Notes (Signed)
CSW received call from group home, patient refused to stay and requested to go to [place].   Catha Gosselin, LCSW [phone]  ED CSW .03/05/2013 1100am
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
## 2013-03-05 NOTE — ED Notes (Signed)
Pt has laceration to back of head approximately 2 inches. Pt's head stapled. No further needs at this time.

## 2013-03-05 NOTE — ED Provider Notes (Signed)
Medical screening examination/treatment/procedure(s) were performed by non-physician practitioner and as supervising physician I was immediately available for consultation/collaboration.  EKG Interpretation   None         Audree Camel, MD 03/05/13 [phone]
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
## 2013-03-06 ENCOUNTER — Encounter (HOSPITAL_COMMUNITY): Payer: Self-pay | Admitting: Emergency Medicine

## 2013-03-06 ENCOUNTER — Emergency Department (HOSPITAL_COMMUNITY)
Admission: EM | Admit: 2013-03-06 | Discharge: 2013-03-06 | Disposition: A | Discharge status: Home / Self Care | Payer: No Typology Code available for payment source | Attending: Emergency Medicine | Admitting: Emergency Medicine

## 2013-03-06 DIAGNOSIS — Y939 Activity, unspecified: Secondary | ICD-10-CM | POA: Insufficient documentation

## 2013-03-06 DIAGNOSIS — Z79899 Other long term (current) drug therapy: Secondary | ICD-10-CM | POA: Insufficient documentation

## 2013-03-06 DIAGNOSIS — W1809XA Striking against other object with subsequent fall, initial encounter: Secondary | ICD-10-CM | POA: Insufficient documentation

## 2013-03-06 DIAGNOSIS — F17200 Nicotine dependence, unspecified, uncomplicated: Secondary | ICD-10-CM | POA: Insufficient documentation

## 2013-03-06 DIAGNOSIS — Z8673 Personal history of transient ischemic attack (TIA), and cerebral infarction without residual deficits: Secondary | ICD-10-CM | POA: Insufficient documentation

## 2013-03-06 DIAGNOSIS — Z5900 Homelessness unspecified: Secondary | ICD-10-CM | POA: Insufficient documentation

## 2013-03-06 DIAGNOSIS — R45851 Suicidal ideations: Secondary | ICD-10-CM | POA: Insufficient documentation

## 2013-03-06 DIAGNOSIS — F1020 Alcohol dependence, uncomplicated: Secondary | ICD-10-CM | POA: Insufficient documentation

## 2013-03-06 DIAGNOSIS — Z7902 Long term (current) use of antithrombotics/antiplatelets: Secondary | ICD-10-CM | POA: Insufficient documentation

## 2013-03-06 DIAGNOSIS — F1010 Alcohol abuse, uncomplicated: Secondary | ICD-10-CM

## 2013-03-06 DIAGNOSIS — S0100XA Unspecified open wound of scalp, initial encounter: Secondary | ICD-10-CM | POA: Insufficient documentation

## 2013-03-06 DIAGNOSIS — F32A Depression, unspecified: Secondary | ICD-10-CM

## 2013-03-06 DIAGNOSIS — F3289 Other specified depressive episodes: Secondary | ICD-10-CM | POA: Insufficient documentation

## 2013-03-06 DIAGNOSIS — F329 Major depressive disorder, single episode, unspecified: Secondary | ICD-10-CM | POA: Insufficient documentation

## 2013-03-06 DIAGNOSIS — Z950 Presence of cardiac pacemaker: Secondary | ICD-10-CM | POA: Insufficient documentation

## 2013-03-06 DIAGNOSIS — I10 Essential (primary) hypertension: Secondary | ICD-10-CM | POA: Insufficient documentation

## 2013-03-06 DIAGNOSIS — Y929 Unspecified place or not applicable: Secondary | ICD-10-CM | POA: Insufficient documentation

## 2013-03-06 LAB — CBC
HCT: 40.5 % (ref 39.0–52.0)
Hemoglobin: 13.6 g/dL (ref 13.0–17.0)
MCHC: 33.6 g/dL (ref 30.0–36.0)
RDW: 15.8 % — ABNORMAL HIGH (ref 11.5–15.5)
WBC: 5.3 10*3/uL (ref 4.0–10.5)

## 2013-03-06 LAB — COMPREHENSIVE METABOLIC PANEL
ALT: 24 U/L (ref 0–53)
AST: 25 U/L (ref 0–37)
Albumin: 4 g/dL (ref 3.5–5.2)
Alkaline Phosphatase: 82 U/L (ref 39–117)
BUN: 8 mg/dL (ref 6–23)
Creatinine, Ser: 0.75 mg/dL (ref 0.50–1.35)
Glucose, Bld: 78 mg/dL (ref 70–99)
Potassium: 4 mEq/L (ref 3.5–5.1)
Sodium: 139 mEq/L (ref 135–145)
Total Protein: 7.8 g/dL (ref 6.0–8.3)

## 2013-03-06 LAB — RAPID URINE DRUG SCREEN, HOSP PERFORMED
Amphetamines: NOT DETECTED
Barbiturates: NOT DETECTED
Benzodiazepines: POSITIVE — AB
Tetrahydrocannabinol: NOT DETECTED

## 2013-03-06 MED ORDER — ZOLPIDEM TARTRATE 5 MG PO TABS: 5.0000 mg | ORAL_TABLET | Freq: Every evening | ORAL | Status: DC | PRN

## 2013-03-06 MED ORDER — VITAMIN B-1 100 MG PO TABS
100.0000 mg | ORAL_TABLET | Freq: Every day | ORAL | Status: DC
  Administered 2013-03-06: 10:00:00 100 mg via ORAL
  Filled 2013-03-06: qty 1

## 2013-03-06 MED ORDER — LORAZEPAM 1 MG PO TABS: 0.0000 mg | ORAL_TABLET | Freq: Two times a day (BID) | ORAL | Status: DC

## 2013-03-06 MED ORDER — ADULT MULTIVITAMIN W/MINERALS CH
1.0000 | ORAL_TABLET | Freq: Every day | ORAL | Status: DC
  Administered 2013-03-06: 1 via ORAL
  Filled 2013-03-06: qty 1

## 2013-03-06 MED ORDER — ONDANSETRON HCL 4 MG PO TABS: 4.0000 mg | ORAL_TABLET | Freq: Three times a day (TID) | ORAL | Status: DC | PRN

## 2013-03-06 MED ORDER — LISINOPRIL 10 MG PO TABS
10.0000 mg | ORAL_TABLET | Freq: Every day | ORAL | Status: DC
  Administered 2013-03-06: 10 mg via ORAL
  Filled 2013-03-06: qty 1

## 2013-03-06 MED ORDER — LORAZEPAM 1 MG PO TABS: 1.0000 mg | ORAL_TABLET | Freq: Three times a day (TID) | ORAL | Status: DC | PRN

## 2013-03-06 MED ORDER — THIAMINE HCL 100 MG/ML IJ SOLN: 100.0000 mg | Freq: Every day | INTRAMUSCULAR | Status: DC

## 2013-03-06 MED ORDER — AMLODIPINE BESYLATE 10 MG PO TABS
10.0000 mg | ORAL_TABLET | Freq: Every day | ORAL | Status: DC
  Administered 2013-03-06: 10 mg via ORAL
  Filled 2013-03-06: qty 1

## 2013-03-06 MED ORDER — GABAPENTIN 400 MG PO CAPS
800.0000 mg | ORAL_CAPSULE | Freq: Four times a day (QID) | ORAL | Status: DC
  Administered 2013-03-06: 800 mg via ORAL
  Filled 2013-03-06 (×4): qty 2

## 2013-03-06 MED ORDER — IBUPROFEN 200 MG PO TABS: 600.0000 mg | ORAL_TABLET | Freq: Three times a day (TID) | ORAL | Status: DC | PRN

## 2013-03-06 MED ORDER — ACETAMINOPHEN 325 MG PO TABS
650.0000 mg | ORAL_TABLET | ORAL | Status: DC | PRN
  Administered 2013-03-06: 650 mg via ORAL
  Filled 2013-03-06: qty 2

## 2013-03-06 MED ORDER — ALUM & MAG HYDROXIDE-SIMETH 200-200-20 MG/5ML PO SUSP
30.0000 mL | ORAL | Status: DC | PRN
Start: 2013-03-06 — End: 2013-03-06

## 2013-03-06 MED ORDER — NICOTINE 21 MG/24HR TD PT24
21.0000 mg | MEDICATED_PATCH | Freq: Every day | TRANSDERMAL | Status: DC
  Filled 2013-03-06: qty 1

## 2013-03-06 MED ORDER — LORAZEPAM 1 MG PO TABS: 0.0000 mg | ORAL_TABLET | Freq: Four times a day (QID) | ORAL | Status: DC

## 2013-03-06 MED ORDER — CLOPIDOGREL BISULFATE 75 MG PO TABS
75.0000 mg | ORAL_TABLET | Freq: Every day | ORAL | Status: DC
  Administered 2013-03-06: 75 mg via ORAL
  Filled 2013-03-06 (×2): qty 1

## 2013-03-06 MED ORDER — FLUOXETINE HCL 20 MG PO CAPS
20.0000 mg | ORAL_CAPSULE | Freq: Every day | ORAL | Status: DC
  Administered 2013-03-06: 20 mg via ORAL
  Filled 2013-03-06: qty 1

## 2013-03-06 NOTE — Progress Notes (Signed)
ED Cm went to see pt x 2 he was heading to bathroom with sitter and presently asleep with eyes closed

## 2013-03-06 NOTE — Progress Notes (Signed)
Per psychiatrist, patient psychiatrically stable for discharge home. Pt refusing treatment. Pt dc pending completion of psychiatrist note.   Catha Gosselin, LCSW [phone]  ED CSW .03/06/2013 1120am

## 2013-03-06 NOTE — ED Notes (Signed)
Patient states "I am here because of suicide. There is nothing left for me. I am alienated from my family. I was sent to an assisted living place out in [place] and the lady the runs the place is the sister of the aide to Dr. Ladona Ridgel." Patient states that Dr. Ladona Ridgel is a psychiatrist and that he does not like the psychiatric treatment he has been receiving.

## 2013-03-06 NOTE — ED Notes (Signed)
MD at bedside. 
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
## 2013-03-06 NOTE — Progress Notes (Signed)
[facility] CL provided pt with a list of primary care resources, highlighting [facility], [facility] of the [place], and [employer]. Patient stated before that he used to have the [facility] Orange Card with [employer] ([facility] at [place]). CL encouraged pt to contact [facility] at [facility] about getting his [employer] back. Patient has stated in the past that he did not like the [facility]. Today I explained to patient about [facility] the [place] for behavorial health needs.

## 2013-03-06 NOTE — ED Notes (Signed)
During psych assessment, pt reports SI stating "I would not care if I passed out by knocking myself with a hammer."Research scientist (physical sciences) at bedside. Pt resting quietly. No signs of agitation.
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
## 2013-03-06 NOTE — ED Notes (Signed)
Pt brought in by [employer], he called 911 from the hotel and stated that he wanted to die. Pt was just discharged from here earlier this evening. Pt states that he's mad at the psychiatrist because they weren't treating him right.
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
## 2013-03-06 NOTE — ED Notes (Signed)
Hotel key, orange lighter, black plastic comb, pen and wallet also placed in belongings bag

## 2013-03-06 NOTE — ED Notes (Signed)
Staples noted to back of head Site C/D/I with dried blood noted No active bleeding or oozing noted

## 2013-03-06 NOTE — ED Notes (Signed)
Pt belongings consist of dark blue jacket, grey hosptial socks, blue jeans ,rust color tshirt, whit boxers, sneakers.[name] [name]

## 2013-03-06 NOTE — Progress Notes (Signed)
Per discussion with psychiatrist, patient refused treatment or outpatient resources. CSW informed EDP that patient is psychiatrically stable for discharge.   Catha Gosselin, LCSW [phone]  ED CSW .03/06/2013  1329pm

## 2013-03-17 ENCOUNTER — Encounter (HOSPITAL_COMMUNITY): Payer: Self-pay | Admitting: Emergency Medicine

## 2013-03-17 ENCOUNTER — Emergency Department (HOSPITAL_COMMUNITY)
Admission: EM | Admit: 2013-03-17 | Discharge: 2013-03-17 | Disposition: A | Discharge status: Home / Self Care | Payer: No Typology Code available for payment source | Attending: Emergency Medicine | Admitting: Emergency Medicine

## 2013-03-17 DIAGNOSIS — Z8659 Personal history of other mental and behavioral disorders: Secondary | ICD-10-CM | POA: Insufficient documentation

## 2013-03-17 DIAGNOSIS — Z8639 Personal history of other endocrine, nutritional and metabolic disease: Secondary | ICD-10-CM | POA: Insufficient documentation

## 2013-03-17 DIAGNOSIS — Z7902 Long term (current) use of antithrombotics/antiplatelets: Secondary | ICD-10-CM | POA: Insufficient documentation

## 2013-03-17 DIAGNOSIS — Z79899 Other long term (current) drug therapy: Secondary | ICD-10-CM | POA: Insufficient documentation

## 2013-03-17 DIAGNOSIS — Z862 Personal history of diseases of the blood and blood-forming organs and certain disorders involving the immune mechanism: Secondary | ICD-10-CM | POA: Insufficient documentation

## 2013-03-17 DIAGNOSIS — I10 Essential (primary) hypertension: Secondary | ICD-10-CM | POA: Insufficient documentation

## 2013-03-17 DIAGNOSIS — Z5900 Homelessness unspecified: Secondary | ICD-10-CM | POA: Insufficient documentation

## 2013-03-17 DIAGNOSIS — Z8673 Personal history of transient ischemic attack (TIA), and cerebral infarction without residual deficits: Secondary | ICD-10-CM | POA: Insufficient documentation

## 2013-03-17 DIAGNOSIS — F17200 Nicotine dependence, unspecified, uncomplicated: Secondary | ICD-10-CM | POA: Insufficient documentation

## 2013-03-17 DIAGNOSIS — Z4802 Encounter for removal of sutures: Secondary | ICD-10-CM | POA: Insufficient documentation

## 2013-03-17 DIAGNOSIS — G40909 Epilepsy, unspecified, not intractable, without status epilepticus: Secondary | ICD-10-CM | POA: Insufficient documentation

## 2013-03-17 NOTE — ED Notes (Signed)
Pt here to have staples removed.  Denies any problems.

## 2013-03-17 NOTE — ED Provider Notes (Signed)
CSN: [id number]     Arrival date & time 03/17/13  1002 History   First MD Initiated Contact with Patient 03/17/13 1011     Chief Complaint  Patient presents with  . Suture / Staple Removal   (Consider location/radiation/quality/duration/timing/severity/associated sxs/prior Treatment) HPI Patient presents for staple removal from his posterior scalp. Patient states that he fell because he always falls from drinking alcohol. States the staples have been in for 12 days. He denies any problems with them. Specifically denies fever, bleeding, purulent discharge, vomiting, body aches. Patient states he is currently drinking alcohol and does not want any assistance with this at this time. Patient denies suicidal or homicidal ideation Past Medical History  Diagnosis Date  . Hypercholesteremia   . Stroke   . Hypertension   . Alcohol abuse   . Seizures   . Depression   . Homelessness    No past surgical history on file. No family history on file. History  Substance Use Topics  . Smoking status: Current Every Day Smoker -- 0.25 packs/day    Types: Cigarettes  . Smokeless tobacco: Not on file  . Alcohol Use: Yes     Comment: drinks bottles of 2 pint  mouthwash daily    Review of Systems  Constitutional: Negative for fever and chills.  Gastrointestinal: Negative for vomiting.  Musculoskeletal: Negative for myalgias.  Skin: Positive for wound. Negative for color change.    Allergies  Pollen extract  Home Medications   Current Outpatient Rx  Name  Route  Sig  Dispense  Refill  . amLODipine (NORVASC) 10 MG tablet   Oral   Take 10 mg by mouth daily.         . clopidogrel (PLAVIX) 75 MG tablet   Oral   Take 75 mg by mouth daily with breakfast.         . FLUoxetine (PROZAC) 20 MG capsule   Oral   Take 1 capsule (20 mg total) by mouth daily.   30 capsule   1   . gabapentin (NEURONTIN) 400 MG capsule   Oral   Take 800 mg by mouth 4 (four) times daily.         Marland Kitchen  lisinopril (PRINIVIL,ZESTRIL) 10 MG tablet   Oral   Take 10 mg by mouth daily.         . Multiple Vitamin (MULTIVITAMIN WITH MINERALS) TABS tablet   Oral   Take 1 tablet by mouth daily.   30 tablet   1    BP 105/60  Pulse 81  Temp(Src) 97.2 F (36.2 C) (Oral)  Resp 16  SpO2 100% Physical Exam  Nursing note and vitals reviewed. Constitutional: He appears well-developed and well-nourished. No distress.  HENT:  Head: Normocephalic.    Neck: Neck supple.  Pulmonary/Chest: Effort normal.  Neurological: He is alert.  Skin: He is not diaphoretic.    ED Course  Procedures (including critical care time) Labs Review Labs Reviewed - No data to display Imaging Review No results found.  EKG Interpretation   None      SUTURE REMOVAL Performed by: Trixie Dredge  Consent: Verbal consent obtained. Patient identity confirmed: provided demographic data Time out: Immediately prior to procedure a "time out" was called to verify the correct patient, procedure, equipment, support staff and site/side marked as required.  Location details: posterior scalp  Wound Appearance: clean  Sutures/Staples Removed: 5  staples  Facility: sutures placed in this facility Patient tolerance: Patient tolerated the procedure well with  no immediate complications.     MDM   1. Removal of staples     Patient presents for staple removal. Wound is well healed with no signs of infection.  Patient is alcoholic declines assistance with this at this time - states he's been to "all "the alcohol rehabilitation places in the area. Discussed findings, treatment, and follow up  with patient.  Pt given return precautions.  Pt verbalizes understanding and agrees with plan.        Trixie Dredge, PA-C 03/17/13 1043

## 2013-03-17 NOTE — ED Provider Notes (Signed)
Medical screening examination/treatment/procedure(s) were performed by non-physician practitioner and as supervising physician I was immediately available for consultation/collaboration.  EKG Interpretation   None         Enid Skeens, MD 03/17/13 [id number]

## 2013-03-31 ENCOUNTER — Emergency Department (HOSPITAL_COMMUNITY): Payer: No Typology Code available for payment source

## 2013-03-31 ENCOUNTER — Encounter (HOSPITAL_COMMUNITY): Payer: Self-pay | Admitting: Emergency Medicine

## 2013-03-31 ENCOUNTER — Emergency Department (HOSPITAL_COMMUNITY)
Admission: EM | Admit: 2013-03-31 | Discharge: 2013-04-01 | Disposition: A | Discharge status: Home / Self Care | Payer: No Typology Code available for payment source | Attending: Emergency Medicine | Admitting: Emergency Medicine

## 2013-03-31 DIAGNOSIS — IMO0002 Reserved for concepts with insufficient information to code with codable children: Secondary | ICD-10-CM | POA: Insufficient documentation

## 2013-03-31 DIAGNOSIS — F329 Major depressive disorder, single episode, unspecified: Secondary | ICD-10-CM | POA: Insufficient documentation

## 2013-03-31 DIAGNOSIS — Y9229 Other specified public building as the place of occurrence of the external cause: Secondary | ICD-10-CM | POA: Insufficient documentation

## 2013-03-31 DIAGNOSIS — Z5900 Homelessness unspecified: Secondary | ICD-10-CM | POA: Insufficient documentation

## 2013-03-31 DIAGNOSIS — Z8673 Personal history of transient ischemic attack (TIA), and cerebral infarction without residual deficits: Secondary | ICD-10-CM | POA: Insufficient documentation

## 2013-03-31 DIAGNOSIS — Z7902 Long term (current) use of antithrombotics/antiplatelets: Secondary | ICD-10-CM | POA: Insufficient documentation

## 2013-03-31 DIAGNOSIS — Z79899 Other long term (current) drug therapy: Secondary | ICD-10-CM | POA: Insufficient documentation

## 2013-03-31 DIAGNOSIS — R45851 Suicidal ideations: Secondary | ICD-10-CM | POA: Insufficient documentation

## 2013-03-31 DIAGNOSIS — Y9389 Activity, other specified: Secondary | ICD-10-CM | POA: Insufficient documentation

## 2013-03-31 DIAGNOSIS — R296 Repeated falls: Secondary | ICD-10-CM | POA: Insufficient documentation

## 2013-03-31 DIAGNOSIS — I10 Essential (primary) hypertension: Secondary | ICD-10-CM | POA: Insufficient documentation

## 2013-03-31 DIAGNOSIS — F17200 Nicotine dependence, unspecified, uncomplicated: Secondary | ICD-10-CM | POA: Insufficient documentation

## 2013-03-31 DIAGNOSIS — F3289 Other specified depressive episodes: Secondary | ICD-10-CM | POA: Insufficient documentation

## 2013-03-31 DIAGNOSIS — F10929 Alcohol use, unspecified with intoxication, unspecified: Secondary | ICD-10-CM

## 2013-03-31 DIAGNOSIS — S298XXA Other specified injuries of thorax, initial encounter: Secondary | ICD-10-CM | POA: Insufficient documentation

## 2013-03-31 DIAGNOSIS — G40909 Epilepsy, unspecified, not intractable, without status epilepticus: Secondary | ICD-10-CM | POA: Insufficient documentation

## 2013-03-31 DIAGNOSIS — F10229 Alcohol dependence with intoxication, unspecified: Secondary | ICD-10-CM | POA: Insufficient documentation

## 2013-03-31 DIAGNOSIS — Z8639 Personal history of other endocrine, nutritional and metabolic disease: Secondary | ICD-10-CM | POA: Insufficient documentation

## 2013-03-31 DIAGNOSIS — S0990XA Unspecified injury of head, initial encounter: Secondary | ICD-10-CM | POA: Insufficient documentation

## 2013-03-31 DIAGNOSIS — Z862 Personal history of diseases of the blood and blood-forming organs and certain disorders involving the immune mechanism: Secondary | ICD-10-CM | POA: Insufficient documentation

## 2013-03-31 LAB — COMPREHENSIVE METABOLIC PANEL
ALBUMIN: 3.9 g/dL (ref 3.5–5.2)
ALK PHOS: 85 U/L (ref 39–117)
ALT: 18 U/L (ref 0–53)
AST: 33 U/L (ref 0–37)
BUN: 16 mg/dL (ref 6–23)
CO2: 18 meq/L — AB (ref 19–32)
CREATININE: 0.74 mg/dL (ref 0.50–1.35)
Calcium: 8.5 mg/dL (ref 8.4–10.5)
Chloride: 96 mEq/L (ref 96–112)
GLUCOSE: 63 mg/dL — AB (ref 70–99)
POTASSIUM: 4.3 meq/L (ref 3.7–5.3)
Sodium: 140 mEq/L (ref 137–147)
Total Bilirubin: 0.5 mg/dL (ref 0.3–1.2)
Total Protein: 7.7 g/dL (ref 6.0–8.3)

## 2013-03-31 LAB — CBC WITH DIFFERENTIAL/PLATELET
BASOS PCT: 1 % (ref 0–1)
Basophils Absolute: 0 10*3/uL (ref 0.0–0.1)
Eosinophils Absolute: 0 10*3/uL (ref 0.0–0.7)
Eosinophils Relative: 1 % (ref 0–5)
HEMATOCRIT: 42.2 % (ref 39.0–52.0)
HEMOGLOBIN: 14.7 g/dL (ref 13.0–17.0)
LYMPHS ABS: 1.2 10*3/uL (ref 0.7–4.0)
LYMPHS PCT: 26 % (ref 12–46)
MCH: 34.7 pg — ABNORMAL HIGH (ref 26.0–34.0)
MCHC: 34.8 g/dL (ref 30.0–36.0)
MCV: 99.5 fL (ref 78.0–100.0)
MONO ABS: 0.3 10*3/uL (ref 0.1–1.0)
Monocytes Relative: 7 % (ref 3–12)
NEUTROS ABS: 2.8 10*3/uL (ref 1.7–7.7)
NEUTROS PCT: 65 % (ref 43–77)
Platelets: 158 10*3/uL (ref 150–400)
RBC: 4.24 MIL/uL (ref 4.22–5.81)
RDW: 15.9 % — ABNORMAL HIGH (ref 11.5–15.5)
WBC: 4.4 10*3/uL (ref 4.0–10.5)

## 2013-03-31 LAB — ACETAMINOPHEN LEVEL

## 2013-03-31 LAB — SALICYLATE LEVEL: Salicylate Lvl: <2 mg/dL — ABNORMAL LOW (ref 2.8–20.0)

## 2013-03-31 LAB — ETHANOL: ALCOHOL ETHYL (B): 226 mg/dL — AB (ref 0–11)

## 2013-03-31 LAB — GLUCOSE, CAPILLARY: Glucose-Capillary: 121 mg/dL — ABNORMAL HIGH (ref 70–99)

## 2013-03-31 MED ORDER — LORAZEPAM 1 MG PO TABS
1.0000 mg | ORAL_TABLET | Freq: Four times a day (QID) | ORAL | Status: DC | PRN
  Administered 2013-04-01: 1 mg via ORAL
  Filled 2013-03-31: qty 1

## 2013-03-31 MED ORDER — ADULT MULTIVITAMIN W/MINERALS CH
1.0000 | ORAL_TABLET | Freq: Every day | ORAL | Status: DC
  Administered 2013-04-01: 1 via ORAL
  Filled 2013-03-31: qty 1

## 2013-03-31 MED ORDER — LORAZEPAM 1 MG PO TABS: 0.0000 mg | ORAL_TABLET | Freq: Four times a day (QID) | ORAL | Status: DC

## 2013-03-31 MED ORDER — IBUPROFEN 200 MG PO TABS: 600.0000 mg | ORAL_TABLET | Freq: Three times a day (TID) | ORAL | Status: DC | PRN

## 2013-03-31 MED ORDER — LORAZEPAM 2 MG/ML IJ SOLN: 1.0000 mg | Freq: Four times a day (QID) | INTRAMUSCULAR | Status: DC | PRN

## 2013-03-31 MED ORDER — ACETAMINOPHEN 325 MG PO TABS
650.0000 mg | ORAL_TABLET | ORAL | Status: DC | PRN
  Administered 2013-04-01: 650 mg via ORAL
  Filled 2013-03-31: qty 2

## 2013-03-31 MED ORDER — THIAMINE HCL 100 MG/ML IJ SOLN: 100.0000 mg | Freq: Every day | INTRAMUSCULAR | Status: DC

## 2013-03-31 MED ORDER — VITAMIN B-1 100 MG PO TABS
100.0000 mg | ORAL_TABLET | Freq: Every day | ORAL | Status: DC
  Administered 2013-03-31 – 2013-04-01 (×2): 100 mg via ORAL
  Filled 2013-03-31 (×2): qty 1

## 2013-03-31 MED ORDER — LORAZEPAM 1 MG PO TABS: 0.0000 mg | ORAL_TABLET | Freq: Two times a day (BID) | ORAL | Status: DC

## 2013-03-31 MED ORDER — FOLIC ACID 1 MG PO TABS
1.0000 mg | ORAL_TABLET | Freq: Every day | ORAL | Status: DC
  Administered 2013-04-01: 1 mg via ORAL
  Filled 2013-03-31: qty 1

## 2013-03-31 NOTE — ED Notes (Signed)
Fell sat and is complianing of lt rib pain and generalized pain. alcoholic and has Listerine is what he drinks, etoh at this time. Pt states that he fell at [employer] on [place] and has dark dried  blood to back of head area.Pt called ems from home today. Alert to person

## 2013-03-31 NOTE — ED Notes (Signed)
Pt reported to MD [name] [name] that he has a pain to harm self with knifes and that he does not want ot be discharge.

## 2013-03-31 NOTE — ED Provider Notes (Signed)
CSN: [id number]     Arrival date & time 03/31/13  1200 History   First MD Initiated Contact with Patient 03/31/13 1211     Chief Complaint  Patient presents with  . Fall   (Consider location/radiation/quality/duration/timing/severity/associated sxs/prior Treatment) The history is provided by the patient.  Erick Murin is a 63 y.o. male chronic alcoholic here presenting with fall. He states that he was at [employer] 4 days ago and slipped and fell. He landed on the back of his head. Also had some left rib pain. Denies any syncope and he drinks daily and usually drinks Listerine. He drinks daily and last drink was today. He also has vague suicidal ideation and plans to cut himself with a razor. Denies homicidal ideation. Wants detox today.    Past Medical History  Diagnosis Date  . Hypercholesteremia   . Stroke   . Hypertension   . Alcohol abuse   . Seizures   . Depression   . Homelessness    History reviewed. No pertinent past surgical history. No family history on file. History  Substance Use Topics  . Smoking status: Current Every Day Smoker -- 0.25 packs/day    Types: Cigarettes  . Smokeless tobacco: Not on file  . Alcohol Use: Yes     Comment: drinks bottles of 2 pint  mouthwash daily    Review of Systems  Cardiovascular:       Rib pain   Psychiatric/Behavioral: Positive for suicidal ideas.  All other systems reviewed and are negative.    Allergies  Pollen extract  Home Medications   Current Outpatient Rx  Name  Route  Sig  Dispense  Refill  . amLODipine (NORVASC) 10 MG tablet   Oral   Take 10 mg by mouth daily.         . clopidogrel (PLAVIX) 75 MG tablet   Oral   Take 75 mg by mouth daily with breakfast.         . FLUoxetine (PROZAC) 20 MG capsule   Oral   Take 1 capsule (20 mg total) by mouth daily.   30 capsule   1   . gabapentin (NEURONTIN) 400 MG capsule   Oral   Take 800 mg by mouth 4 (four) times daily.         Marland Kitchen lisinopril  (PRINIVIL,ZESTRIL) 10 MG tablet   Oral   Take 10 mg by mouth daily.         . Multiple Vitamin (MULTIVITAMIN WITH MINERALS) TABS tablet   Oral   Take 1 tablet by mouth daily.   30 tablet   1    There were no vitals taken for this visit. Physical Exam  Nursing note and vitals reviewed. Constitutional:  Chronically ill, intoxicated   HENT:  Head: Normocephalic.  Mouth/Throat: Oropharynx is clear and moist.  Dry blood on posterior scalp, no obvious laceration   Eyes: Conjunctivae are normal. Pupils are equal, round, and reactive to light.  Neck: Normal range of motion. Neck supple.  Cardiovascular: Normal rate, regular rhythm and normal heart sounds.   Pulmonary/Chest: Effort normal and breath sounds normal. No respiratory distress. He has no wheezes. He has no rales.  L lower ribs with reproducible tenderness, no obvious deformity   Abdominal: Soft. Bowel sounds are normal. He exhibits no distension. There is no tenderness. There is no rebound and no guarding.  Musculoskeletal: Normal range of motion. He exhibits no edema and no tenderness.  Neurological: He is alert.  Intoxicated, moving all  extremities   Skin: Skin is warm and dry.  Psychiatric: He has a normal mood and affect. His behavior is normal. Judgment and thought content normal.    ED Course  Procedures (including critical care time) Labs Review Labs Reviewed  CBC WITH DIFFERENTIAL - Abnormal; Notable for the following:    MCH 34.7 (*)    RDW 15.9 (*)    All other components within normal limits  COMPREHENSIVE METABOLIC PANEL - Abnormal; Notable for the following:    CO2 18 (*)    Glucose, Bld 63 (*)    All other components within normal limits  ETHANOL - Abnormal; Notable for the following:    Alcohol, Ethyl (B) 226 (*)    All other components within normal limits  SALICYLATE LEVEL - Abnormal; Notable for the following:    Salicylate Lvl <2.0 (*)    All other components within normal limits  GLUCOSE,  CAPILLARY - Abnormal; Notable for the following:    Glucose-Capillary 121 (*)    All other components within normal limits  ACETAMINOPHEN LEVEL  URINE RAPID DRUG SCREEN (HOSP PERFORMED)   Imaging Review Dg Ribs Unilateral W/chest Left  03/31/2013   CLINICAL DATA:  Fall  EXAM: LEFT RIBS AND CHEST - 3+ VIEW  COMPARISON:  None.  FINDINGS: No fracture or other bone lesions are seen involving the ribs. There is no evidence of pneumothorax or pleural effusion. Both lungs are clear. Heart size and mediastinal contours are within normal limits. Atherosclerotic calcification identified within the abdominal aorta.  IMPRESSION: Negative.   Electronically Signed   By: Salome HolmesHector  Cooper M.D.   On: 03/31/2013 13:58   Ct Head Wo Contrast  03/31/2013   CLINICAL DATA:  Head injury after fall.  EXAM: CT HEAD WITHOUT CONTRAST  TECHNIQUE: Contiguous axial images were obtained from the base of the skull through the vertex without intravenous contrast.  COMPARISON:  CT scans of March 05, 2013 and May 11, 2010.  FINDINGS: Old fracture involving the right supraorbital rim and floor of right anterior cranial fossa is noted which was present on the prior exam of 2012 and is unchanged. No acute fracture is noted. Diffuse cortical atrophy is noted. Mild chronic ischemic white matter disease is noted. No mass effect or midline shift is noted. Ventricular size is within normal limits. There is no evidence of mass lesion, hemorrhage or acute infarction.  IMPRESSION: Mild diffuse cortical atrophy. Mild chronic ischemic white matter disease. Old fracture involving right supraorbital rim and floor of right anterior cranial fossa which is unchanged compared with 2012. No acute intracranial abnormality is noted.   Electronically Signed   By: Roque LiasJames  Green M.D.   On: 03/31/2013 12:40    EKG Interpretation   None       MDM  No diagnosis found. Festus HoltsChristopher Valiente is a 63 y.o. male here with s/p fall and alcohol intoxication.  Will check ETOH level, do CT head, L rib xray. Will call TTS for eval for suicidal ideation and detox placement.   3:11 PM Patient still suicidal and wants to cut himself. Will get TTS consult and move to psych ED. Will start on CIWA. CT and xray showed no fracture.     Richardean Canalavid H Teon Hudnall, MD 03/31/13 1515

## 2013-03-31 NOTE — Treatment Plan (Signed)
Pt is [facility] as [facility] and has made clear that he intends to continue to seek admission whenever he sees fit.  His statements have been [facility] regarding his intentions to seek readmission, usually in between "checks."  Due to his chronic SI and malingering behaviors, this pt needs a higher level care and/or permanent placement.

## 2013-03-31 NOTE — ED Notes (Signed)
Pt aaox3.  Pt present with c/o SI.  Pt denies HI, AH, VH.  Pt reports plan to kill himself by taking pills.  Pt reports depression since 2008, his mother passed away.

## 2013-03-31 NOTE — Progress Notes (Signed)
[facility] CL provided pt with a list of primary care resources. CL asked patient if he had tried to get a pcp, since last ED visit. Patient stated he had not. Also, stated that he had a lot going on and had not had time. Encouraged patient to find a PCP.

## 2013-04-01 DIAGNOSIS — F1010 Alcohol abuse, uncomplicated: Secondary | ICD-10-CM

## 2013-04-01 MED ORDER — LISINOPRIL 10 MG PO TABS
10.0000 mg | ORAL_TABLET | Freq: Every day | ORAL | Status: DC
  Administered 2013-04-01: 10 mg via ORAL
  Filled 2013-04-01: qty 1

## 2013-04-01 MED ORDER — AMLODIPINE BESYLATE 10 MG PO TABS
10.0000 mg | ORAL_TABLET | Freq: Every day | ORAL | Status: DC
  Administered 2013-04-01: 10 mg via ORAL
  Filled 2013-04-01: qty 1

## 2013-04-01 MED ORDER — CLOPIDOGREL BISULFATE 75 MG PO TABS
75.0000 mg | ORAL_TABLET | Freq: Once | ORAL | Status: AC
  Administered 2013-04-01: 75 mg via ORAL
  Filled 2013-04-01: qty 1

## 2013-04-01 NOTE — Discharge Instructions (Signed)
Finding Treatment for Alcohol and Drug Addiction It can be hard to find the right place to get professional treatment. Here are some important things to consider:  There are different types of treatment to choose from.  Some programs are live-in (residential) while others are not (outpatient). Sometimes a combination is offered.  No single type of program is right for everyone.  Most treatment programs involve a combination of education, counseling, and a 12-step, spiritually-based approach.  There are non-spiritually based programs (not 12-step).  Some treatment programs are government sponsored. They are geared for patients without private insurance.  Treatment programs can vary in many respects such as:  Cost and types of insurance accepted.  Types of on-site medical services offered.  Length of stay, setting, and size.  Overall philosophy of treatment. A person may need specialized treatment or have needs not addressed by all programs. For example, adolescents need treatment appropriate for their age. Other people have secondary disorders that must be managed as well. Secondary conditions can include mental illness, such as depression or diabetes. Often, a period of detoxification from alcohol or drugs is needed. This requires medical supervision and not all programs offer this. THINGS TO CONSIDER WHEN SELECTING A TREATMENT PROGRAM   Is the program certified by the appropriate government agency? Even private programs must be certified and employ certified professionals.  Does the program accept your insurance? If not, can a payment plan be set up?  Is the facility clean, organized, and well run? Do they allow you to speak with graduates who can share their treatment experience with you? Can you tour the facility? Can you meet with staff?  Does the program meet the full range of individual needs?  Does the treatment program address sexual orientation and physical disabilities?  Do they provide age, gender, and culturally appropriate treatment services?  Is treatment available in languages other than English?  Is long-term aftercare support or guidance encouraged and provided?  Is assessment of an individual's treatment plan ongoing to ensure it meets changing needs?  Does the program use strategies to encourage reluctant patients to remain in treatment long enough to increase the likelihood of success?  Does the program offer counseling (individual or group) and other behavioral therapies?  Does the program offer medicine as part of the treatment regimen, if needed?  Is there ongoing monitoring of possible relapse? Is there a defined relapse prevention program? Are services or referrals offered to family members to ensure they understand addiction and the recovery process? This would help them support the recovering individual.  Are 12-step meetings held at the center or is transport available for patients to attend outside meetings? In countries outside of the [place] and [place], [employer]or, minerals for contact information for services in your area. Document Released: 02/01/2005 Document Revised: 05/28/2011 Document Reviewed: 08/14/2007 [facility] Patient Information 2014 [place].  Alcohol and Nutrition Nutrition serves two purposes. It provides energy. It also maintains body structure and function. Food supplies energy. It also provides the building blocks needed to replace worn or damaged cells. Alcoholics often eat poorly. This limits their supply of essential nutrients. This affects energy supply and structure maintenance. Alcohol also affects the body's nutrients in:  Digestion.  Storage.  Using and getting rid of waste products. IMPAIRMENT OF NUTRIENT DIGESTION AND UTILIZATION   Once ingested, food must be broken down into small components (digested). Then it is available for energy. It helps maintain body structure and function. Digestion begins  in the mouth.  It continues in the stomach and intestines, with help from the pancreas. The nutrients from digested food are absorbed from the intestines into the blood. Then they are carried to the liver. The liver prepares nutrients for:  Immediate use.  Storage and future use.  Alcohol inhibits the breakdown of nutrients into usable molecules.  It decreases secretion of digestive enzymes from the pancreas.  Alcohol impairs nutrient absorption by damaging the cells lining the stomach and intestines.  It also interferes with moving some nutrients into the blood.  In addition, nutritional deficiencies themselves may lead to further absorption problems.  For example, folate deficiency changes the cells that line the small intestine. This impairs how water is absorbed. It also affects absorbed nutrients. These include glucose, sodium, and additional folate.  Even if nutrients are digested and absorbed, alcohol can prevent them from being fully used. It changes their transport, storage, and excretion. Impaired utilization of nutrients by alcoholics is indicated by:  Decreased liver stores of vitamins, such as vitamin A.  Increased excretion of nutrients such as fat. ALCOHOL AND ENERGY SUPPLY   Three basic nutritional components found in food are:  Carbohydrates.  Proteins.  Fats.  These are used as energy. Some alcoholics take in as much as 50% of their total daily calories from alcohol. They often neglect important foods.  Even when enough food is eaten, alcohol can impair the ways the body controls blood sugar (glucose) levels. It may either increase or decrease blood sugar.  In non-diabetic alcoholics, increased blood sugar (hyperglycemia) is caused by poor insulin secretion. It is usually temporary.  Decreased blood sugar (hypoglycemia) can cause serious injury even if this condition is short-lived. Low blood sugar can happen when a fasting or malnourished person drinks  alcohol. When there is no food to supply energy, stored sugar is used up. The products of alcohol inhibit forming glucose from other compounds such as amino acids. As a result, alcohol causes the brain and other body tissue to lack glucose. It is needed for energy and function.  Alcohol is an energy source. But how the body processes and uses the energy from alcohol is complex. Also, when alcohol is substituted for carbohydrates, subjects tend to lose weight. This indicates that they get less energy from alcohol than from food. ALCOHOL - MAINTAINING CELL STRUCTURE AND FUNCTION  Structure Cells are made mostly of protein. So an adequate protein diet is important for maintaining cell structure. This is especially true if cells are being damaged. Research indicates that alcohol affects protein nutrition by causing impaired:  Digestion of proteins to amino acids.  Processing of amino acids by the small intestine and liver.  Synthesis of proteins from amino acids.  Protein secretion by the liver. Function Nutrients are essential for the body to function well. They provide the tools that the body needs to work well:   Proteins.  Vitamins.  Minerals. Alcohol can disrupt body function. It may cause nutrient deficiencies. And it may interfere with the way nutrients are processed. Vitamins  Vitamins are essential to maintain growth and normal metabolism. They regulate many of the body`s processes. Chronic heavy drinking causes deficiencies in many vitamins. This is caused by eating less. And, in some cases, vitamins may be poorly absorbed. For example, alcohol inhibits fat absorption. It impairs how the vitamins A, E, and D are normally absorbed along with dietary fats. Not enough vitamin A may cause night blindness. Not enough vitamin D may cause softening of the bones.  Some alcoholics lack vitamins A, C, D, E, K, and the B vitamins. These are all involved in wound healing and cell maintenance. In  particular, because vitamin K is necessary for blood clotting, lacking that vitamin can cause delayed clotting. The result is excess bleeding. Lacking other vitamins involved in brain function may cause severe neurological damage. Minerals Deficiencies of minerals such as calcium, magnesium, iron, and zinc are common in alcoholics. The alcohol itself does not seem to affect how these minerals are absorbed. Rather, they seem to occur secondary to other alcohol-related problems, such as:  Less calcium absorbed.  Not enough magnesium.  More urinary excretion.  Vomiting.  Diarrhea.  Not enough iron due to gastrointestinal bleeding.  Not enough zinc or losses related to other nutrient deficiencies.  Mineral deficiencies can cause a variety of medical consequences. These range from calcium-related bone disease to zinc-related night blindness and skin lesions. ALCOHOL, MALNUTRITION, AND MEDICAL COMPLICATIONS  Liver Disease   Alcoholic liver damage is caused primarily by alcohol itself. But poor nutrition may increase the risk of alcohol-related liver damage. For example, nutrients normally found in the liver are known to be affected by drinking alcohol. These include carotenoids, which are the major sources of vitamin A, and vitamin E compounds. Decreases in such nutrients may play some role in alcohol-related liver damage. Pancreatitis  Research suggests that malnutrition may increase the risk of developing alcoholic pancreatitis. Research suggests that a diet lacking in protein may increase alcohol's damaging effect on the pancreas. Brain  Nutritional deficiencies may have severe effects on brain function. These may be permanent. Specifically, thiamine deficiencies are often seen in alcoholics. They can cause severe neurological problems. These include:  Impaired movement.  Memory loss seen in Wernicke-Korsakoff syndrome. Pregnancy  Alcohol has toxic effects on fetal development. It  causes alcohol-related birth defects. They include fetal alcohol syndrome. Alcohol itself is toxic to the fetus. Also, the nutritional deficiency can affect how the fetus develops. That may compound the risk of developmental damage.  Nutritional needs during pregnancy are 10% to 30% greater than normal. Food intake can increase by as much as 140% to cover the needs of both mother and fetus. An alcoholic mother`s nutritional problems may adversely affect the nutrition of the fetus. And alcohol itself can also restrict nutrition flow to the fetus. NUTRITIONAL STATUS OF ALCOHOLICS  Techniques for assessing nutritional status include:  Taking body measurements to estimate fat reserves. They include:  Weight.  Height.  Mass.  Skin fold thickness.  Performing blood analysis to provide measurements of circulating:  Proteins.  Vitamins.  Minerals.  These techniques tend to be imprecise. For many nutrients, there is no clear "cut-off" point that would allow an accurate definition of deficiency. So assessing the nutritional status of alcoholics is limited by these techniques. Dietary status may provide information about the risk of developing nutritional problems. Dietary status is assessed by:  Taking patients' dietary histories.  Evaluating the amount and types of food they are eating.  It is difficult to determine what exact amount of alcohol begins to have damaging effects on nutrition. In general, moderate drinkers have 2 drinks or less per day. They seem to be at little risk for nutritional problems. Various medical disorders begin to appear at greater levels.  Research indicates that the majority of even the heaviest drinkers have few obvious nutritional deficiencies. Many alcoholics who are hospitalized for medical complications of their disease do have severe malnutrition. Alcoholics tend to eat poorly. Often they  eat less than the amounts of food necessary to provide  enough:  Carbohydrates.  Protein.  Fat.  Vitamins A and C.  B vitamins.  Minerals like calcium and iron. Of major concern is alcohol's effect on digesting food and use of nutrients. It may shift a mildly malnourished person toward severe malnutrition. Document Released: 12/28/2004 Document Revised: 05/28/2011 Document Reviewed: 06/13/2005 [facility] Patient Information 2014 [place].

## 2013-04-01 NOTE — Progress Notes (Addendum)
The following the facilities have been contacted regarding inptx:   [facility] per Rosey Bathe[facility] beds available, referral faxed  [facility]- no answer, referral faxed [facility]- per [facility] beds available, referral fax[facility]- [facility] per Francesco SorJuvonne can fax, referral faxed [facility]- per Gigi beds available, referral faxed [facility]- beds available, referral faxed [facility]- [facility] available [facility] per Harriett SineNancy at capacity [facility]- per [facility] per Caryn BeeKevin at capacity    [facility] Tuch, MHT

## 2013-04-01 NOTE — ED Notes (Signed)
Patient endorsed feeling somewhat better at his time. He rated his pain at 4 on a scale of 1-10 with 10 the worst.  His B/P rechecked and it's better than the first check. Patient stated that he has history of Hypertesion and that he takes Norvasc 10 mg QD with Lisinopril 5 mg QD; although he said he's not been compliant with his medications. He appears to be doing better than he was earlier

## 2013-04-01 NOTE — BH Assessment (Signed)
Spoke with [facility] Byrd HesselbachMaria who agreed to seek placement for patient at other facilities at this time.   Glorious PeachNajah Prince Olivier, MS, LCASA Assessment Counselor

## 2013-04-01 NOTE — ED Notes (Signed)
Resting comfortably.

## 2013-04-01 NOTE — [facility] Suicide Risk Assessment (Signed)
Suicide Risk Assessment  Discharge Assessment     Demographic Factors:  Male, Caucasian, Low socioeconomic status, Living alone and Unemployed  Mental Status Per Nursing Assessment::   On Admission:     Current Mental Status by Physician: NA  Loss Factors: Decline in physical health  Historical Factors: Prior suicide attempts and Impulsivity  Risk Reduction Factors:   NA  Continued Clinical Symptoms:  Depression:   Anhedonia Hopelessness  Cognitive Features That Contribute To Risk:  Closed-mindedness    Suicide Risk:  Mild:  Suicidal ideation of limited frequency, intensity, duration, and specificity.  There are no identifiable plans, no associated intent, mild dysphoria and related symptoms, good self-control (both objective and subjective assessment), few other risk factors, and identifiable protective factors, including available and accessible social support.  Discharge Diagnoses:   AXIS I:  alcohol dependence and major depression, recurrent, moderate AXIS II:  Deferred AXIS III:   Past Medical History  Diagnosis Date  . Hypercholesteremia   . Stroke   . Hypertension   . Alcohol abuse   . Seizures   . Depression   . Homelessness    AXIS IV:  economic problems, housing problems and chronic alcoholism AXIS V:  51-60 moderate symptoms  Plan Of Care/Follow-up recommendations:  Activity:  resume regular activity Diet:  resume regular diet  Is patient on multiple antipsychotic therapies at discharge:  No   Has Patient had three or more failed trials of antipsychotic monotherapy by history:  No  Recommended Plan for Multiple Antipsychotic Therapies: NA  TAYLOR,GERALD D 04/01/2013, 12:32 PM

## 2013-04-01 NOTE — Consult Note (Signed)
[facility] Consult   Reason for Consult:  Intoxicated and fell in parking lot and he is always somewhat suicidal Referring Physician:  ER MD  [name] [name] is an 63 y.o. male.  Assessment: AXIS I:  alcohol dependence and major depression,recurrent, moderate AXIS II:  Deferred AXIS III:   Past Medical History  Diagnosis Date  . Hypercholesteremia   . Stroke   . Hypertension   . Alcohol abuse   . Seizures   . Depression   . Homelessness    AXIS IV:  economic problems, housing problems and chronic alcoholic who does not cooperate with treatment AXIS V:  51-60 moderate symptoms  Plan:  No evidence of imminent risk to self or others at present.    Subjective:   [name] [name] is a 63 y.o. male patient admitted with intoxicated fall once again and his usual chronic wish not to live.  HPI:  This is the same pattern repeated weekly to every other week.  We have spent a lot of time trying to help him including getting him into assisted living which he left the same day.  He is always suicidal saying there is nothing left to live for but not actively suicidal today.  Main reason was to get help for his pain after the fall.  He says he is okay with going home. He chooses the [employer] to buy the mouth wash which he gets drunk on and then falls. HPI Elements:   Location:  back and arms hurt after fall. Quality:  pain 4 on a scale of 10. Severity:  not as bad as on admission. Timing:  drank mouth wash  and fell. Duration:  years of alcoholism. Context:  drinking again.  Past Psychiatric History: Past Medical History  Diagnosis Date  . Hypercholesteremia   . Stroke   . Hypertension   . Alcohol abuse   . Seizures   . Depression   . Homelessness     reports that he has been smoking Cigarettes.  He has been smoking about 0.25 packs per day. He does not have any smokeless tobacco history on file. He reports that he drinks alcohol. He reports that he does not use  illicit drugs. No family history on file. Family History Substance Abuse: Yes, Describe: (Pt reports family hx of SA) Family Supports: No Living Arrangements: Other (Comment) (lives in a house with 8 roommates) Can pt return to current living arrangement?: Yes Abuse/Neglect [facility]) Physical Abuse: Denies Verbal Abuse: Denies Sexual Abuse: Denies Allergies:   Allergies  Allergen Reactions  . Pollen Extract     Sneezing    ACT Assessment Complete:  Yes:    Educational Status    Risk to Self: Risk to self Suicidal Ideation: Yes-Currently Present Suicidal Intent: Yes-Currently Present Is patient at risk for suicide?: Yes Suicidal Plan?: Yes-Currently Present Specify Current Suicidal Plan: overdose on pills Access to Means: Yes Specify Access to Suicidal Means: rx medications What has been your use of drugs/alcohol within the last 12 months?: Daily etoh use(pt drinks 1/5 of mouthwash  Previous Attempts/Gestures: No How many times?: 0 Other Self Harm Risks: None noted Triggers for Past Attempts: Other (Comment) (housing issues,intoxication) Intentional Self Injurious Behavior: None Family Suicide History: Yes (father ptsd, brother attempted suicide) Recent stressful life event(s): Conflict (Comment) Persecutory voices/beliefs?: No Depression: Yes Depression Symptoms: Isolating;Loss of interest in usual pleasures;Feeling worthless/self pity Substance abuse history and/or treatment for substance abuse?: Yes (BAL 226)  Risk to Others: Risk to Others  Homicidal Ideation: No Thoughts of Harm to Others: No Current Homicidal Intent: No Current Homicidal Plan: No Access to Homicidal Means: No Identified Victim: na History of harm to others?: No Assessment of Violence: None Noted Violent Behavior Description: Cooperative during TTS assessment Does patient have access to weapons?: Yes (Comment) (pt reports that he has access to knives) Criminal Charges Pending?: No Does patient have  a court date: No  Abuse: Abuse/Neglect Assessment (Assessment to be complete while patient is alone) Physical Abuse: Denies Verbal Abuse: Denies Sexual Abuse: Denies Exploitation of patient/patient's resources: Denies Self-Neglect: Denies  Prior Inpatient Therapy: Prior Inpatient Therapy Prior Inpatient Therapy: Yes Prior Therapy Dates: 01/30/13 Prior Therapy Facilty/Provider([facility]): [facility] Reason for Treatment: Detox[facility]  Prior Outpatient Therapy: Prior Outpatient Therapy Prior Outpatient Therapy: No Prior Therapy Dates: na Prior Therapy Facilty/Provider(s): na Reason for Treatment: na  Additional Information: Additional Information 1:1 In Past 12 Months?: No CIRT Risk: No Elopement Risk: No Does patient have medical clearance?: Yes                  Objective: Blood pressure 154/95, pulse 100, temperature 98 F (36.7 C), temperature source Oral, resp. rate 18, SpO2 99.00%.There is no weight on file to calculate BMI. Results for orders placed during the hospital encounter of 03/31/13 (from the past 72 hour(s))  CBC WITH DIFFERENTIAL     Status: Abnormal   Collection Time    03/31/13 12:15 PM      Result Value Range   WBC 4.4  4.0 - 10.5 K/uL   RBC 4.24  4.22 - 5.81 MIL/uL   Hemoglobin 14.7  13.0 - 17.0 g/dL   HCT 42.2  39.0 - 52.0 %   MCV 99.5  78.0 - 100.0 fL   MCH 34.7 (*) 26.0 - 34.0 pg   MCHC 34.8  30.0 - 36.0 g/dL   RDW 15.9 (*) 11.5 - 15.5 %   Platelets 158  150 - 400 K/uL   Neutrophils Relative % 65  43 - 77 %   Neutro Abs 2.8  1.7 - 7.7 K/uL   Lymphocytes Relative 26  12 - 46 %   Lymphs Abs 1.2  0.7 - 4.0 K/uL   Monocytes Relative 7  3 - 12 %   Monocytes Absolute 0.3  0.1 - 1.0 K/uL   Eosinophils Relative 1  0 - 5 %   Eosinophils Absolute 0.0  0.0 - 0.7 K/uL   Basophils Relative 1  0 - 1 %   Basophils Absolute 0.0  0.0 - 0.1 K/uL  COMPREHENSIVE METABOLIC PANEL     Status: Abnormal   Collection Time    03/31/13 12:15 PM       Result Value Range   Sodium 140  137 - 147 mEq/L   Comment: REPEATED TO VERIFY   Potassium 4.3  3.7 - 5.3 mEq/L   Chloride 96  96 - 112 mEq/L   Comment: REPEATED TO VERIFY   CO2 18 (*) 19 - 32 mEq/L   Comment: REPEATED TO VERIFY   Glucose, Bld 63 (*) 70 - 99 mg/dL   BUN 16  6 - 23 mg/dL   Creatinine, Ser 0.74  0.50 - 1.35 mg/dL   Calcium 8.5  8.4 - 10.5 mg/dL   Total Protein 7.7  6.0 - 8.3 g/dL   Albumin 3.9  3.5 - 5.2 g/dL   AST 33  0 - 37 U/L   ALT 18  0 - 53 U/L   Alkaline  Phosphatase 85  39 - 117 U/L   Total Bilirubin 0.5  0.3 - 1.2 mg/dL   GFR calc non Af Amer >90  >90 mL/min   GFR calc Af Amer >90  >90 mL/min   Comment: (NOTE)     The eGFR has been calculated using the CKD EPI equation.     This calculation has not been validated in all clinical situations.     eGFR's persistently <90 mL/min signify possible Chronic Kidney     Disease.  ETHANOL     Status: Abnormal   Collection Time    03/31/13 12:15 PM      Result Value Range   Alcohol, Ethyl (B) 226 (*) 0 - 11 mg/dL   Comment:            LOWEST DETECTABLE LIMIT FOR     SERUM ALCOHOL IS 11 mg/dL     FOR MEDICAL PURPOSES ONLY  ACETAMINOPHEN LEVEL     Status: None   Collection Time    03/31/13 12:15 PM      Result Value Range   Acetaminophen (Tylenol), Serum <15.0  10 - 30 ug/mL   Comment:            THERAPEUTIC CONCENTRATIONS VARY     SIGNIFICANTLY. A RANGE OF 10-30     ug/mL MAY BE AN EFFECTIVE     CONCENTRATION FOR MANY PATIENTS.     HOWEVER, SOME ARE BEST TREATED     AT CONCENTRATIONS OUTSIDE THIS     RANGE.     ACETAMINOPHEN CONCENTRATIONS     >150 ug/mL AT 4 HOURS AFTER     INGESTION AND >50 ug/mL AT 12     HOURS AFTER INGESTION ARE     OFTEN ASSOCIATED WITH TOXIC     REACTIONS.  SALICYLATE LEVEL     Status: Abnormal   Collection Time    03/31/13 12:15 PM      Result Value Range   Salicylate Lvl <2.0 (*) 2.8 - 20.0 mg/dL  GLUCOSE, CAPILLARY     Status: Abnormal   Collection Time    03/31/13   2:18 PM      Result Value Range   Glucose-Capillary 121 (*) 70 - 99 mg/dL   Labs are reviewed and are pertinent for alcohol.  Current Facility-Administered Medications  Medication Dose Route Frequency Provider Last Rate Last Dose  . acetaminophen (TYLENOL) tablet 650 mg  650 mg Oral Q4H PRN Richardean Canal, MD   650 mg at 04/01/13 0550  . amLODipine (NORVASC) tablet 10 mg  10 mg Oral Daily Shuvon Rankin, NP   10 mg at 04/01/13 1123  . folic acid (FOLVITE) tablet 1 mg  1 mg Oral Daily Richardean Canal, MD   1 mg at 04/01/13 1032  . ibuprofen (ADVIL,MOTRIN) tablet 600 mg  600 mg Oral Q8H PRN Richardean Canal, MD      . lisinopril (PRINIVIL,ZESTRIL) tablet 10 mg  10 mg Oral Daily Shuvon Rankin, NP   10 mg at 04/01/13 1123  . LORazepam (ATIVAN) tablet 1 mg  1 mg Oral Q6H PRN Richardean Canal, MD   1 mg at 04/01/13 0550   Or  . LORazepam (ATIVAN) injection 1 mg  1 mg Intravenous Q6H PRN Richardean Canal, MD      . LORazepam (ATIVAN) tablet 0-4 mg  0-4 mg Oral Q6H Richardean Canal, MD       Followed by  . [START ON 04/02/2013] LORazepam (ATIVAN)  tablet 0-4 mg  0-4 mg Oral Q12H Wandra Arthurs, MD      . multivitamin with minerals tablet 1 tablet  1 tablet Oral Daily Wandra Arthurs, MD   1 tablet at 04/01/13 1032  . thiamine (VITAMIN B-1) tablet 100 mg  100 mg Oral Daily Wandra Arthurs, MD   100 mg at 04/01/13 1032   Or  . thiamine (B-1) injection 100 mg  100 mg Intravenous Daily Wandra Arthurs, MD       Current Outpatient Prescriptions  Medication Sig Dispense Refill  . amLODipine (NORVASC) 10 MG tablet Take 10 mg by mouth daily.      . clopidogrel (PLAVIX) 75 MG tablet Take 75 mg by mouth daily with breakfast.      . FLUoxetine (PROZAC) 20 MG capsule Take 1 capsule (20 mg total) by mouth daily.  30 capsule  1  . gabapentin (NEURONTIN) 400 MG capsule Take 800 mg by mouth 4 (four) times daily.      Marland Kitchen lisinopril (PRINIVIL,ZESTRIL) 10 MG tablet Take 10 mg by mouth daily.      . Multiple Vitamin (MULTIVITAMIN WITH MINERALS) TABS tablet  Take 1 tablet by mouth daily.  30 tablet  1    Psychiatric Specialty Exam:     Blood pressure 154/95, pulse 100, temperature 98 F (36.7 C), temperature source Oral, resp. rate 18, SpO2 99.00%.There is no weight on file to calculate BMI.  General Appearance: Disheveled  Eye Contact::  Good  Speech:  Clear and Coherent  Volume:  Normal  Mood:  Depressed  Affect:  Appropriate  Thought Process:  Coherent, Goal Directed and Logical  Orientation:  Full (Time, Place, and Person)  Thought Content:  Negative  Suicidal Thoughts:  No  Homicidal Thoughts:  No  Memory:  Immediate;   Good Recent;   Good Remote;   Good  Judgement:  Intact  Insight:  Fair  Psychomotor Activity:  walks with a walker  Concentration:  Good  Recall:  Good  Akathisia:  Negative  Handed:  Right  AIMS (if indicated):     Assets:  Communication Skills Housing  Sleep:      Treatment Plan Summary: discharge home today to followup outpatient Review of Systems  Eyes: Negative.   Respiratory: Negative.   Cardiovascular: Negative.   Gastrointestinal: Negative.   Genitourinary: Negative.   Musculoskeletal: Positive for back pain, falls, joint pain and neck pain.  Skin: Negative.   Neurological: Positive for weakness and headaches.  Endo/Heme/Allergies: Negative.   Psychiatric/Behavioral: Positive for depression.     Clarene Reamer 04/01/2013 12:14 PM

## 2013-04-01 NOTE — ED Notes (Signed)
Patient complaint having pain on his left side ribs this morning, he rated the pain as 6 on the scale of 1-10 with 10 the worst. [facility]. He also endorsed feeling; "lousy, anxious, having shakes and not feeling good at all". Visible tremor noted and patient appeared to be anxious. Writer offered Tylenol 650 mg and Ativan 1 mg. Patient received these medications without difficult. Q 15 minute check continues as ordered to maintain safety.

## 2013-04-01 NOTE — ED Notes (Signed)
Patient in bed, awake at the beginning of this shift. He endorsed feeling all right and denied pain and denied having any problem. Writer encouraged patient to report any problem to this Clinical research associatewriter or other staff. Q15 minute check continues as ordered to maintain safety.

## 2013-04-01 NOTE — ED Notes (Signed)
D/C instructions reviewed. No prescriptions written. Belongings bag x2 returned. No complaints voiced. Ambulatory without difficulty. [name] called to request transportation. Voucher provided. Escorted by mental health tech to front of hospital.

## 2013-04-01 NOTE — BH Assessment (Signed)
Assessment Note  [name] [name] is an 63 y.o. male. Pt presents to to [facility] with C/O medical clearance. Pt reports that he fell in [place] on 03-28-13 while he was intoxicated. Pt reports pain in his ribs. Pt endorses current SI with a plan to overdose on pills. Pt reports that he lives in a house with 8 roommates for the past 2 weeks and does not like this living arrangement. Pt reports that he does not live in a safe neighborhood and this is stressful for him. Pt reports that he has access to knives and has thought about harming himself with the knives. Pt reports Neuropathy issues that cause him to feel chronic pain. Inpatient treatment recommended for safety and stabilization at this time. EDP notified of this plan.  Per [facility] Thurman Coyer pt is declined at [facility] and will need to placed at another facility at this time.   Axis I: Major Depression, Recurrent severe, Alcohol Depedence Axis II: Deferred Axis III:  Past Medical History  Diagnosis Date  . Hypercholesteremia   . Stroke   . Hypertension   . Alcohol abuse   . Seizures   . Depression   . Homelessness    Axis IV: economic problems, housing problems, other psychosocial or environmental problems and problems related to social environment Axis V: 31-40 impairment in reality testing  Past Medical History:  Past Medical History  Diagnosis Date  . Hypercholesteremia   . Stroke   . Hypertension   . Alcohol abuse   . Seizures   . Depression   . Homelessness     History reviewed. No pertinent past surgical history.  Family History: No family history on file.  Social History:  reports that he has been smoking Cigarettes.  He has been smoking about 0.25 packs per day. He does not have any smokeless tobacco history on file. He reports that he drinks alcohol. He reports that he does not use illicit drugs.  Additional Social History:  Alcohol / Drug Use History of alcohol / drug use?: Yes Substance #1 Name of Substance 1:  Mouthwash (Mouthwash) 1 - Age of First Use:  (10) 1 - Amount (size/oz):  (1/5 ) 1 - Frequency:  (Daily) 1 - Duration:  (pt reports abusing etoh for the past 40 yrs) 1 - Last Use / Amount:  (03/31/12/unk amt)  CIWA: CIWA-Ar BP: 162/86 mmHg Pulse Rate: 92 Nausea and Vomiting: no nausea and no vomiting Tactile Disturbances: none Tremor: no tremor Auditory Disturbances: not present Paroxysmal Sweats: no sweat visible Visual Disturbances: not present Anxiety: no anxiety, at ease Headache, Fullness in Head: none present Agitation: normal activity Orientation and Clouding of Sensorium: oriented and can do serial additions CIWA-Ar Total: 0 COWS:    Allergies:  Allergies  Allergen Reactions  . Pollen Extract     Sneezing    Home Medications:  (Not in a hospital admission)  OB/GYN Status:  No LMP for male patient.  General Assessment Data Location of Assessment: [facility] Is this a Tele or Face-to-Face Assessment?: Face-to-Face Is this an Initial Assessment or a Re-assessment for this encounter?: Initial Assessment Living Arrangements: Other (Comment) (lives in a house with 8 roommates) Can pt return to current living arrangement?: Yes Admission Status: Voluntary Is patient capable of signing voluntary admission?: Yes Transfer from: Other (Comment) Referral Source: MD     [facility] Crisis Care Plan Living Arrangements: Other (Comment) (lives in a house with 8 roommates) Name of Psychiatrist: No Current Provider Name of Therapist: No  Current Provider     Risk to self Suicidal Ideation: Yes-Currently Present Suicidal Intent: Yes-Currently Present Is patient at risk for suicide?: Yes Suicidal Plan?: Yes-Currently Present Specify Current Suicidal Plan: overdose on pills Access to Means: Yes Specify Access to Suicidal Means: rx medications What has been your use of drugs/alcohol within the last 12 months?: Daily etoh use(pt drinks 1/5 of mouthwash  Previous Attempts/Gestures:  No How many times?: 0 Other Self Harm Risks: None noted Triggers for Past Attempts: Other (Comment) (housing issues,intoxication) Intentional Self Injurious Behavior: None Family Suicide History: Yes (father ptsd, brother attempted suicide) Recent stressful life event(s): Conflict (Comment) Persecutory voices/beliefs?: No Depression: Yes Depression Symptoms: Isolating;Loss of interest in usual pleasures;Feeling worthless/self pity Substance abuse history and/or treatment for substance abuse?: Yes  Risk to Others Homicidal Ideation: No Thoughts of Harm to Others: No Current Homicidal Intent: No Current Homicidal Plan: No Access to Homicidal Means: No Identified Victim: na History of harm to others?: No Assessment of Violence: None Noted Violent Behavior Description: Cooperative during TTS assessment Does patient have access to weapons?: Yes (Comment) (pt reports that he has access to knives) Criminal Charges Pending?: No Does patient have a court date: No  Psychosis Hallucinations: None noted (pt reports a hx of AVH in the past when intoxicated) Delusions: None noted  Mental Status Report Appear/Hygiene: Disheveled Eye Contact: Fair Motor Activity: Freedom of movement Speech: Logical/coherent Level of Consciousness: Alert Mood: Depressed Affect: Depressed Anxiety Level: None Thought Processes: Coherent;Relevant;Circumstantial Judgement: Impaired Orientation: Person;Place;Situation Obsessive Compulsive Thoughts/Behaviors: None  Cognitive Functioning Concentration: Decreased Memory: Recent Intact;Remote Intact IQ: Average Insight: Poor Impulse Control: Poor Appetite: Fair Weight Loss:  (none noted) Weight Gain:  (none noted) Sleep: No Change Total Hours of Sleep:  (varies) Vegetative Symptoms: Staying in bed;Not bathing;Decreased grooming  ADLScreening [facility] Assessment Services) Patient's cognitive ability adequate to safely complete daily activities?:  Yes Patient able to express need for assistance with ADLs?: Yes Independently performs ADLs?: Yes (appropriate for developmental age)  Prior Inpatient Therapy Prior Inpatient Therapy: Yes Prior Therapy Dates: 01/30/13 [facility] Reason for Treatment: Detox, [facility]  Prior Outpatient Therapy Prior Outpatient Therapy: No Prior Therapy Dates: na Prior Therapy Facilty/Provider(s): na Reason for Treatment: na  ADL Screening (condition at time of admission) Patient's cognitive ability adequate to safely complete daily activities?: Yes Is the patient deaf or have difficulty hearing?: No Does the patient have difficulty seeing, even when wearing glasses/contacts?: No Does the patient have difficulty concentrating, remembering, or making decisions?: No Patient able to express need for assistance with ADLs?: Yes Does the patient have difficulty dressing or bathing?: No Independently performs ADLs?: Yes (appropriate for developmental age) Does the patient have difficulty walking or climbing stairs?: Yes (Pt reports using a walker sometimes for stability and balance) Weakness of Legs: None Weakness of Arms/Hands: None  Home Assistive Devices/Equipment Home Assistive Devices/Equipment: Walker (specify type)    Abuse/Neglect Assessment (Assessment to be complete while patient is alone) Physical Abuse: Denies Verbal Abuse: Denies Sexual Abuse: Denies Exploitation of patient/patient's resources: Denies Self-Neglect: Denies     Merchant navy officerAdvance Directives (For Healthcare) Advance Directive: Patient does not have advance directive;Patient would not like information    Additional Information 1:1 In Past 12 Months?: No CIRT Risk: No Elopement Risk: No Does patient have medical clearance?: Yes     Disposition:  Disposition Initial Assessment Completed for this Encounter: Yes Disposition of Patient: Inpatient treatment program ([facility] AC Eric's note in Epic)  Type of inpatient treatment program: Adult  On Site Evaluation by:   Reviewed with Physician:    Gerline Legacy, MS, LCASA Assessment Counselor  04/01/2013 1:06 AM

## 2013-05-16 ENCOUNTER — Emergency Department (HOSPITAL_COMMUNITY): Payer: No Typology Code available for payment source

## 2013-05-16 ENCOUNTER — Emergency Department (HOSPITAL_COMMUNITY)
Admission: EM | Admit: 2013-05-16 | Discharge: 2013-05-16 | Disposition: A | Discharge status: Home / Self Care | Payer: No Typology Code available for payment source | Attending: Emergency Medicine | Admitting: Emergency Medicine

## 2013-05-16 ENCOUNTER — Encounter (HOSPITAL_COMMUNITY): Payer: Self-pay | Admitting: Emergency Medicine

## 2013-05-16 DIAGNOSIS — R0602 Shortness of breath: Secondary | ICD-10-CM | POA: Insufficient documentation

## 2013-05-16 DIAGNOSIS — Z8659 Personal history of other mental and behavioral disorders: Secondary | ICD-10-CM | POA: Insufficient documentation

## 2013-05-16 DIAGNOSIS — Z8673 Personal history of transient ischemic attack (TIA), and cerebral infarction without residual deficits: Secondary | ICD-10-CM | POA: Insufficient documentation

## 2013-05-16 DIAGNOSIS — Z5900 Homelessness unspecified: Secondary | ICD-10-CM | POA: Insufficient documentation

## 2013-05-16 DIAGNOSIS — Z8669 Personal history of other diseases of the nervous system and sense organs: Secondary | ICD-10-CM | POA: Insufficient documentation

## 2013-05-16 DIAGNOSIS — R071 Chest pain on breathing: Secondary | ICD-10-CM | POA: Insufficient documentation

## 2013-05-16 DIAGNOSIS — R059 Cough, unspecified: Secondary | ICD-10-CM | POA: Insufficient documentation

## 2013-05-16 DIAGNOSIS — R0789 Other chest pain: Secondary | ICD-10-CM

## 2013-05-16 DIAGNOSIS — R05 Cough: Secondary | ICD-10-CM | POA: Insufficient documentation

## 2013-05-16 DIAGNOSIS — Z862 Personal history of diseases of the blood and blood-forming organs and certain disorders involving the immune mechanism: Secondary | ICD-10-CM | POA: Insufficient documentation

## 2013-05-16 DIAGNOSIS — I10 Essential (primary) hypertension: Secondary | ICD-10-CM | POA: Insufficient documentation

## 2013-05-16 DIAGNOSIS — Z8639 Personal history of other endocrine, nutritional and metabolic disease: Secondary | ICD-10-CM | POA: Insufficient documentation

## 2013-05-16 DIAGNOSIS — F17200 Nicotine dependence, unspecified, uncomplicated: Secondary | ICD-10-CM | POA: Insufficient documentation

## 2013-05-16 LAB — COMPREHENSIVE METABOLIC PANEL
ALBUMIN: 3.7 g/dL (ref 3.5–5.2)
ALT: 12 U/L (ref 0–53)
AST: 26 U/L (ref 0–37)
Alkaline Phosphatase: 99 U/L (ref 39–117)
BUN: 5 mg/dL — ABNORMAL LOW (ref 6–23)
CALCIUM: 9.2 mg/dL (ref 8.4–10.5)
CO2: 23 mEq/L (ref 19–32)
Chloride: 96 mEq/L (ref 96–112)
Creatinine, Ser: 0.71 mg/dL (ref 0.50–1.35)
GFR calc non Af Amer: >90 mL/min (ref >90)
Glucose, Bld: 126 mg/dL — ABNORMAL HIGH (ref 70–99)
Potassium: 4 mEq/L (ref 3.7–5.3)
Sodium: 137 mEq/L (ref 137–147)
TOTAL PROTEIN: 7.5 g/dL (ref 6.0–8.3)
Total Bilirubin: 0.4 mg/dL (ref 0.3–1.2)

## 2013-05-16 LAB — CBC WITH DIFFERENTIAL/PLATELET
BASOS ABS: 0 10*3/uL (ref 0.0–0.1)
Basophils Relative: 0 % (ref 0–1)
EOS ABS: 0 10*3/uL (ref 0.0–0.7)
Eosinophils Relative: 1 % (ref 0–5)
HCT: 42.8 % (ref 39.0–52.0)
Hemoglobin: 15.1 g/dL (ref 13.0–17.0)
Lymphocytes Relative: 18 % (ref 12–46)
Lymphs Abs: 0.9 10*3/uL (ref 0.7–4.0)
MCH: 35.8 pg — AB (ref 26.0–34.0)
MCHC: 35.3 g/dL (ref 30.0–36.0)
MCV: 101.4 fL — ABNORMAL HIGH (ref 78.0–100.0)
Monocytes Absolute: 0.6 10*3/uL (ref 0.1–1.0)
Monocytes Relative: 12 % (ref 3–12)
NEUTROS ABS: 3.3 10*3/uL (ref 1.7–7.7)
NEUTROS PCT: 69 % (ref 43–77)
PLATELETS: 172 10*3/uL (ref 150–400)
RBC: 4.22 MIL/uL (ref 4.22–5.81)
RDW: 14.8 % (ref 11.5–15.5)
WBC: 4.7 10*3/uL (ref 4.0–10.5)

## 2013-05-16 LAB — TROPONIN I: Troponin I: <0.3 ng/mL (ref <0.30)

## 2013-05-16 LAB — PRO B NATRIURETIC PEPTIDE: PRO B NATRI PEPTIDE: 56.8 pg/mL (ref 0–125)

## 2013-05-16 MED ORDER — IPRATROPIUM BROMIDE 0.02 % IN SOLN
0.5000 mg | Freq: Once | RESPIRATORY_TRACT | Status: AC
  Administered 2013-05-16: 0.5 mg via RESPIRATORY_TRACT
  Filled 2013-05-16: qty 2.5

## 2013-05-16 MED ORDER — ALBUTEROL SULFATE (2.5 MG/3ML) 0.083% IN NEBU
5.0000 mg | INHALATION_SOLUTION | Freq: Once | RESPIRATORY_TRACT | Status: AC
  Administered 2013-05-16: 5 mg via RESPIRATORY_TRACT
  Filled 2013-05-16: qty 6

## 2013-05-16 MED ORDER — OXYCODONE-ACETAMINOPHEN 5-325 MG PO TABS: 1.0000 | ORAL_TABLET | ORAL | Status: DC | PRN

## 2013-05-16 MED ORDER — LEVOFLOXACIN 500 MG PO TABS: 500.0000 mg | ORAL_TABLET | Freq: Every day | ORAL | Status: DC

## 2013-05-16 MED ORDER — MORPHINE SULFATE 4 MG/ML IJ SOLN
6.0000 mg | Freq: Once | INTRAMUSCULAR | Status: AC
  Administered 2013-05-16: 6 mg via INTRAVENOUS
  Filled 2013-05-16: qty 2

## 2013-05-16 MED ORDER — IBUPROFEN 600 MG PO TABS: 600.0000 mg | ORAL_TABLET | Freq: Three times a day (TID) | ORAL | Status: DC | PRN

## 2013-05-16 MED ORDER — ALBUTEROL SULFATE HFA 108 (90 BASE) MCG/ACT IN AERS: 2.0000 | INHALATION_SPRAY | RESPIRATORY_TRACT | Status: AC | PRN

## 2013-05-16 MED ORDER — KETOROLAC TROMETHAMINE 30 MG/ML IJ SOLN
30.0000 mg | Freq: Once | INTRAMUSCULAR | Status: AC
  Administered 2013-05-16: 30 mg via INTRAVENOUS
  Filled 2013-05-16: qty 1

## 2013-05-16 MED ORDER — LEVOFLOXACIN 500 MG PO TABS
500.0000 mg | ORAL_TABLET | Freq: Once | ORAL | Status: AC
  Administered 2013-05-16: 500 mg via ORAL
  Filled 2013-05-16: qty 1

## 2013-05-16 NOTE — ED Notes (Signed)
Patient requested something to drink.

## 2013-05-16 NOTE — ED Provider Notes (Signed)
CSN: [id number]     Arrival date & time 05/16/13  0446 History   First MD Initiated Contact with Patient 05/16/13 [phone]     Chief Complaint  Patient presents with  . Chest Pain      HPI Patient presents emergency Department with developing cough as well as some shortness of breath over the past 24 hours.  Patient is a chronic alcoholic and fell 4 days ago.  He states he injured his left chest.  No prior history of cardiac disease.  No prior history of congestive heart failure.  He has had a stroke before.  He states his pain in his left chest is worse with palpation.   Past Medical History  Diagnosis Date  . Hypercholesteremia   . Stroke   . Hypertension   . Alcohol abuse   . Seizures   . Depression   . Homelessness    History reviewed. No pertinent past surgical history. No family history on file. History  Substance Use Topics  . Smoking status: Current Every Day Smoker -- 0.25 packs/day    Types: Cigarettes  . Smokeless tobacco: Not on file  . Alcohol Use: Yes     Comment: drinks bottles of 2 pint  mouthwash daily    Review of Systems  All other systems reviewed and are negative.      Allergies  Pollen extract  Home Medications   Current Outpatient Rx  Name  Route  Sig  Dispense  Refill  . ibuprofen (ADVIL,MOTRIN) 600 MG tablet   Oral   Take 1 tablet (600 mg total) by mouth every 8 (eight) hours as needed.   15 tablet   0   . levofloxacin (LEVAQUIN) 500 MG tablet   Oral   Take 1 tablet (500 mg total) by mouth daily.   7 tablet   0   . oxyCODONE-acetaminophen (PERCOCET/ROXICET) 5-325 MG per tablet   Oral   Take 1 tablet by mouth every 4 (four) hours as needed for severe pain.   20 tablet   0    BP 140/88  Pulse 118  Temp(Src) 97.9 F (36.6 C)  Resp 20  Wt 160 lb (72.576 kg)  SpO2 100% Physical Exam  Nursing note and vitals reviewed. Constitutional: He is oriented to person, place, and time. He appears well-developed and well-nourished.   HENT:  Head: Normocephalic and atraumatic.  Eyes: EOM are normal.  Neck: Normal range of motion.  Cardiovascular: Normal rate, regular rhythm, normal heart sounds and intact distal pulses.   Pulmonary/Chest: Effort normal and breath sounds normal. No respiratory distress.  Tenderness left lateral chest wall.  The zoster noted.  Bruising noted.  No crepitus.  Abdominal: Soft. He exhibits no distension. There is no tenderness.  Musculoskeletal: Normal range of motion.  Neurological: He is alert and oriented to person, place, and time.  Skin: Skin is warm and dry.  Psychiatric: He has a normal mood and affect. Judgment normal.    ED Course  Procedures (including critical care time) Labs Review Labs Reviewed  CBC WITH DIFFERENTIAL - Abnormal; Notable for the following:    MCV 101.4 (*)    MCH 35.8 (*)    All other components within normal limits  COMPREHENSIVE METABOLIC PANEL - Abnormal; Notable for the following:    Glucose, Bld 126 (*)    BUN 5 (*)    All other components within normal limits  PRO B NATRIURETIC PEPTIDE  TROPONIN I   Imaging Review Dg Chest 2  View  05/16/2013   CLINICAL DATA:  Left-sided chest pain and shortness of breath.  EXAM: CHEST  2 VIEW  COMPARISON:  Chest radiograph performed 03/31/2013  FINDINGS: The lungs are hyperexpanded with flattening of the hemidiaphragms, compatible with COPD. There is no evidence of focal opacification, pleural effusion or pneumothorax.  The heart is normal in size; the mediastinal contour is within normal limits. No acute osseous abnormalities are seen.  IMPRESSION: Findings of COPD; no acute cardiopulmonary process seen.   Electronically Signed   By: Roanna RaiderJeffery  Chang M.D.   On: 05/16/2013 05:29  I personally reviewed the imaging tests through PACS system I reviewed available ER/hospitalization records through the EMR    EKG Interpretation   Date/Time:  Saturday May 16 2013 04:58:42 EST Ventricular Rate:  95 PR Interval:   156 QRS Duration: 72 QT Interval:  360 QTC Calculation: 452 R Axis:   23 Text Interpretation:  Normal sinus rhythm Normal ECG Confirmed by  YELVERTON  MD, DAVID ([id number]) on 05/16/2013 6:02:09 AM      MDM   Final diagnoses:  Chest wall pain    9:40 AM Patient's pain is improved at this time.  Suspect chest wall injury with associated rib contusion versus occult work fracture.  Patient does have a history of COPD.  He received treatment here.  Home with albuterol inhaler and pain control.  Suspect he is developing early pneumonia given his symptoms and therefore the patient was placed on Levaquin.    Lyanne CoKevin M Tenecia Ignasiak, MD 05/16/13 [phone]

## 2013-05-16 NOTE — ED Notes (Signed)
[facility] called to transported patient home.

## 2013-05-16 NOTE — ED Notes (Addendum)
Pt states that he experienced shortness of breath. States that he had left sided arm pain but that the pain is chronic because of neuropathy. States that he also experienced left sided chest pain but that the pain has subsided. Pt states that he has pain all over the place that it started as neuropathy but he has fallen so many times that he doesn't know what the pain is from.

## 2013-05-16 NOTE — Discharge Instructions (Signed)

## 2013-05-16 NOTE — ED Notes (Signed)
[facility] contacted for transport 

## 2013-05-16 NOTE — ED Notes (Signed)
[facility] at bedside to transport patient home.   

## 2013-05-16 NOTE — ED Notes (Signed)
The pt is c/o chest pain sob and tachycardia for 3-4  Hours.  He arrived by gems and was brought to triage in a w/c

## 2014-04-17 ENCOUNTER — Encounter (HOSPITAL_COMMUNITY): Payer: Self-pay

## 2014-04-17 ENCOUNTER — Emergency Department (HOSPITAL_COMMUNITY)
Admission: EM | Admit: 2014-04-17 | Discharge: 2014-04-17 | Disposition: A | Discharge status: Home / Self Care | Payer: No Typology Code available for payment source | Attending: Emergency Medicine | Admitting: Emergency Medicine

## 2014-04-17 ENCOUNTER — Emergency Department (HOSPITAL_COMMUNITY): Payer: No Typology Code available for payment source

## 2014-04-17 DIAGNOSIS — I10 Essential (primary) hypertension: Secondary | ICD-10-CM | POA: Insufficient documentation

## 2014-04-17 DIAGNOSIS — Y9289 Other specified places as the place of occurrence of the external cause: Secondary | ICD-10-CM | POA: Insufficient documentation

## 2014-04-17 DIAGNOSIS — Z8639 Personal history of other endocrine, nutritional and metabolic disease: Secondary | ICD-10-CM | POA: Insufficient documentation

## 2014-04-17 DIAGNOSIS — G8929 Other chronic pain: Secondary | ICD-10-CM | POA: Insufficient documentation

## 2014-04-17 DIAGNOSIS — W1839XA Other fall on same level, initial encounter: Secondary | ICD-10-CM | POA: Insufficient documentation

## 2014-04-17 DIAGNOSIS — M545 Low back pain: Secondary | ICD-10-CM

## 2014-04-17 DIAGNOSIS — W19XXXA Unspecified fall, initial encounter: Secondary | ICD-10-CM

## 2014-04-17 DIAGNOSIS — Z792 Long term (current) use of antibiotics: Secondary | ICD-10-CM | POA: Insufficient documentation

## 2014-04-17 DIAGNOSIS — Y998 Other external cause status: Secondary | ICD-10-CM | POA: Insufficient documentation

## 2014-04-17 DIAGNOSIS — Y9389 Activity, other specified: Secondary | ICD-10-CM | POA: Insufficient documentation

## 2014-04-17 DIAGNOSIS — Z79899 Other long term (current) drug therapy: Secondary | ICD-10-CM | POA: Insufficient documentation

## 2014-04-17 DIAGNOSIS — Z8669 Personal history of other diseases of the nervous system and sense organs: Secondary | ICD-10-CM | POA: Insufficient documentation

## 2014-04-17 DIAGNOSIS — S3992XA Unspecified injury of lower back, initial encounter: Secondary | ICD-10-CM | POA: Insufficient documentation

## 2014-04-17 DIAGNOSIS — Z590 Homelessness: Secondary | ICD-10-CM | POA: Insufficient documentation

## 2014-04-17 DIAGNOSIS — Z8659 Personal history of other mental and behavioral disorders: Secondary | ICD-10-CM | POA: Insufficient documentation

## 2014-04-17 MED ORDER — CYCLOBENZAPRINE HCL 10 MG PO TABS: 10.0000 mg | ORAL_TABLET | Freq: Two times a day (BID) | ORAL | Status: DC | PRN

## 2014-04-17 MED ORDER — OXYCODONE-ACETAMINOPHEN 5-325 MG PO TABS: 1.0000 | ORAL_TABLET | ORAL | Status: DC | PRN

## 2014-04-17 NOTE — ED Notes (Addendum)
Patient reports that he has had low back pain x 1 month and falling a lot lately. Patient fell this AM and has a small laceration to the right hand. Patient states, "I drink a lot, but I have trouble walking due to pain in my back and legs."

## 2014-04-17 NOTE — Discharge Instructions (Signed)
Back x-ray showed no acute problems. Ice pack. Medication for pain and muscle spasm.

## 2014-04-17 NOTE — ED Provider Notes (Signed)
CSN: [id number]     Arrival date & time 04/17/14  1145 History   First MD Initiated Contact with Patient 04/17/14 1207     Chief Complaint  Patient presents with  . Back Pain  . Fall     (Consider location/radiation/quality/duration/timing/severity/associated sxs/prior Treatment) HPI... Status post fall yesterday secondary to drinking alcohol with associated low back pain. Patient blames his falls on excessive alcohol consumption. He also complains of acute low back pain for approximately one month. No bowel or bladder incontinence. He states chronic low back pain and outpatient consultation with Dr. Turner Daniels the past.  No fever, sweats, chills, dysuria  Past Medical History  Diagnosis Date  . Hypercholesteremia   . Stroke   . Hypertension   . Alcohol abuse   . Seizures   . Depression   . Homelessness    History reviewed. No pertinent past surgical history. Family History  Problem Relation Age of Onset  . Hypertension Mother   . Heart failure Mother   . Hypertension Father    History  Substance Use Topics  . Smoking status: Current Every Day Smoker -- 0.25 packs/day    Types: Cigarettes  . Smokeless tobacco: Never Used  . Alcohol Use: Yes     Comment: drinks 2 bottles of mouth wash daily/ Patient states he drinks alcohol daily    Review of Systems  All other systems reviewed and are negative.     Allergies  Pollen extract  Home Medications   Prior to Admission medications   Medication Sig Start Date End Date Taking? Authorizing Provider  albuterol (PROVENTIL HFA;VENTOLIN HFA) 108 (90 BASE) MCG/ACT inhaler Inhale 2 puffs into the lungs every 4 (four) hours as needed for wheezing or shortness of breath. 05/16/13   Lyanne Co, MD  cyclobenzaprine (FLEXERIL) 10 MG tablet Take 1 tablet (10 mg total) by mouth 2 (two) times daily as needed for muscle spasms. 04/17/14   Donnetta Hutching, MD  ibuprofen (ADVIL,MOTRIN) 600 MG tablet Take 1 tablet (600 mg total) by mouth every 8  (eight) hours as needed. Patient not taking: Reported on 04/17/2014 05/16/13   Lyanne Co, MD  levofloxacin (LEVAQUIN) 500 MG tablet Take 1 tablet (500 mg total) by mouth daily. Patient not taking: Reported on 04/17/2014 05/16/13   Lyanne Co, MD  oxyCODONE-acetaminophen (PERCOCET) 5-325 MG per tablet Take 1-2 tablets by mouth every 4 (four) hours as needed. 04/17/14   Donnetta Hutching, MD   BP 145/94 mmHg  Pulse 73  Temp(Src) 97.7 F (36.5 C) (Oral)  Resp 20  SpO2 98% Physical Exam  Constitutional: He is oriented to person, place, and time. He appears well-developed and well-nourished.  HENT:  Head: Normocephalic and atraumatic.  Eyes: Conjunctivae and EOM are normal. Pupils are equal, round, and reactive to light.  Neck: Normal range of motion. Neck supple.  Cardiovascular: Normal rate and regular rhythm.   Pulmonary/Chest: Effort normal and breath sounds normal.  Abdominal: Soft. Bowel sounds are normal.  Musculoskeletal:  Min tenderness to lumbar spine diffusely.  Pain with straight leg raise  Neurological: He is alert and oriented to person, place, and time.  Skin: Skin is warm and dry.  Psychiatric: He has a normal mood and affect. His behavior is normal.  Nursing note and vitals reviewed.   ED Course  Procedures (including critical care time) Labs Review Labs Reviewed - No data to display  Imaging Review Dg Lumbar Spine Complete  04/17/2014   CLINICAL DATA:  Patient reports  that he has had low back pain x 1 month and falling a lot lately. Patient fell this AM and has a small laceration to the right hand. Patient states, "I drink a lot, but I have trouble walking due to pain in my back and legs." center back pain traveling to hip joints.  EXAM: LUMBAR SPINE - COMPLETE 4+ VIEW  COMPARISON:  05/11/2010  FINDINGS: No fracture. Grade 1 anterolisthesis of L4 on L5, stable. Mild loss of disc height at L3-L4 with moderate loss of disc height at L4-L5 and mild loss of disc height at  L5-S1. Remaining lumbar disc spaces are well preserved.  There is facet degenerative change with facet joint narrowing bilaterally, most notably from L3-L4 through L5-S1.  Bones are diffusely demineralized.  Calcifications noted along a mildly ectatic abdominal aorta.  There is small bowel prominence in the central abdomen without overt dilation. This is nonspecific.  IMPRESSION: 1. No fracture or acute finding. 2. Degenerative changes as described. 3. Mild small bowel prominence evident in the central abdomen. May reflect a mild adynamic ileus but is nonspecific.   Electronically Signed   By: Amie Portlandavid  Ormond M.D.   On: 04/17/2014 13:33     EKG Interpretation None      MDM   Final diagnoses:  Fall  Low back pain without sciatica, unspecified back pain laterality    Patient has chronic low back pain. Plain films of lumbar spine are negative acute. Discharge medications Percocet and Flexeril 10 mg. Follow-up with orthopedic surgeon.    Donnetta HutchingBrian Braison Snoke, MD 04/17/14 [phone]

## 2014-04-17 NOTE — ED Notes (Signed)
Pt reports he fell yesterday because he drink too much and didn't eat anything. Pt alert and oriented upon assessment. PT reports he has had a 40 oz today. He also reports that he was given a walker to use but doesn't always use it.

## 2014-04-17 NOTE — ED Notes (Addendum)
Pt ambulated to restroom with RN assistance.  Tegaderm and non-adherent dressing placed to left hand skin tear.

## 2014-09-09 IMAGING — CR DG CHEST 2V
2 series · 2 of 2 positions shown · non-contrast
Comparison: 04/13/2010

CLINICAL DATA: Fall and cough.

CHEST - 2 VIEW

[w chest lat (1 of 2)]
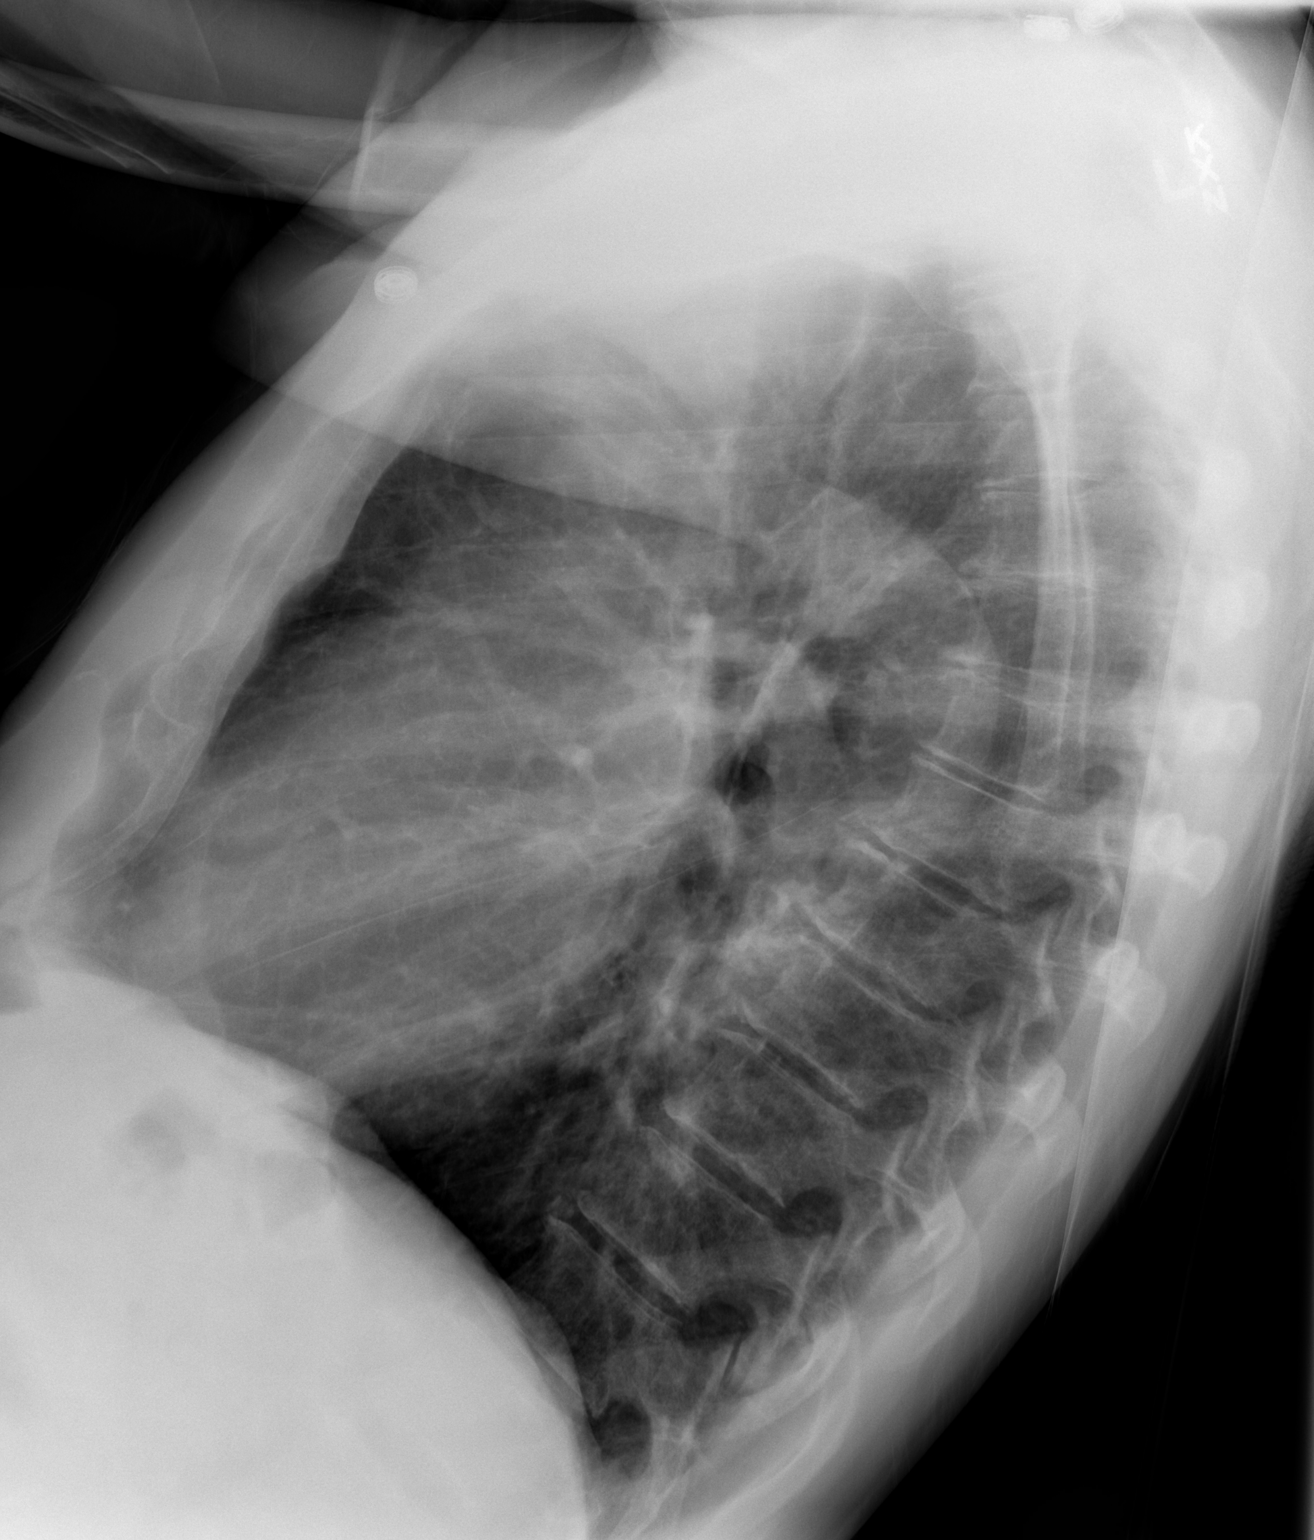

[w chest lat (2 of 2)]
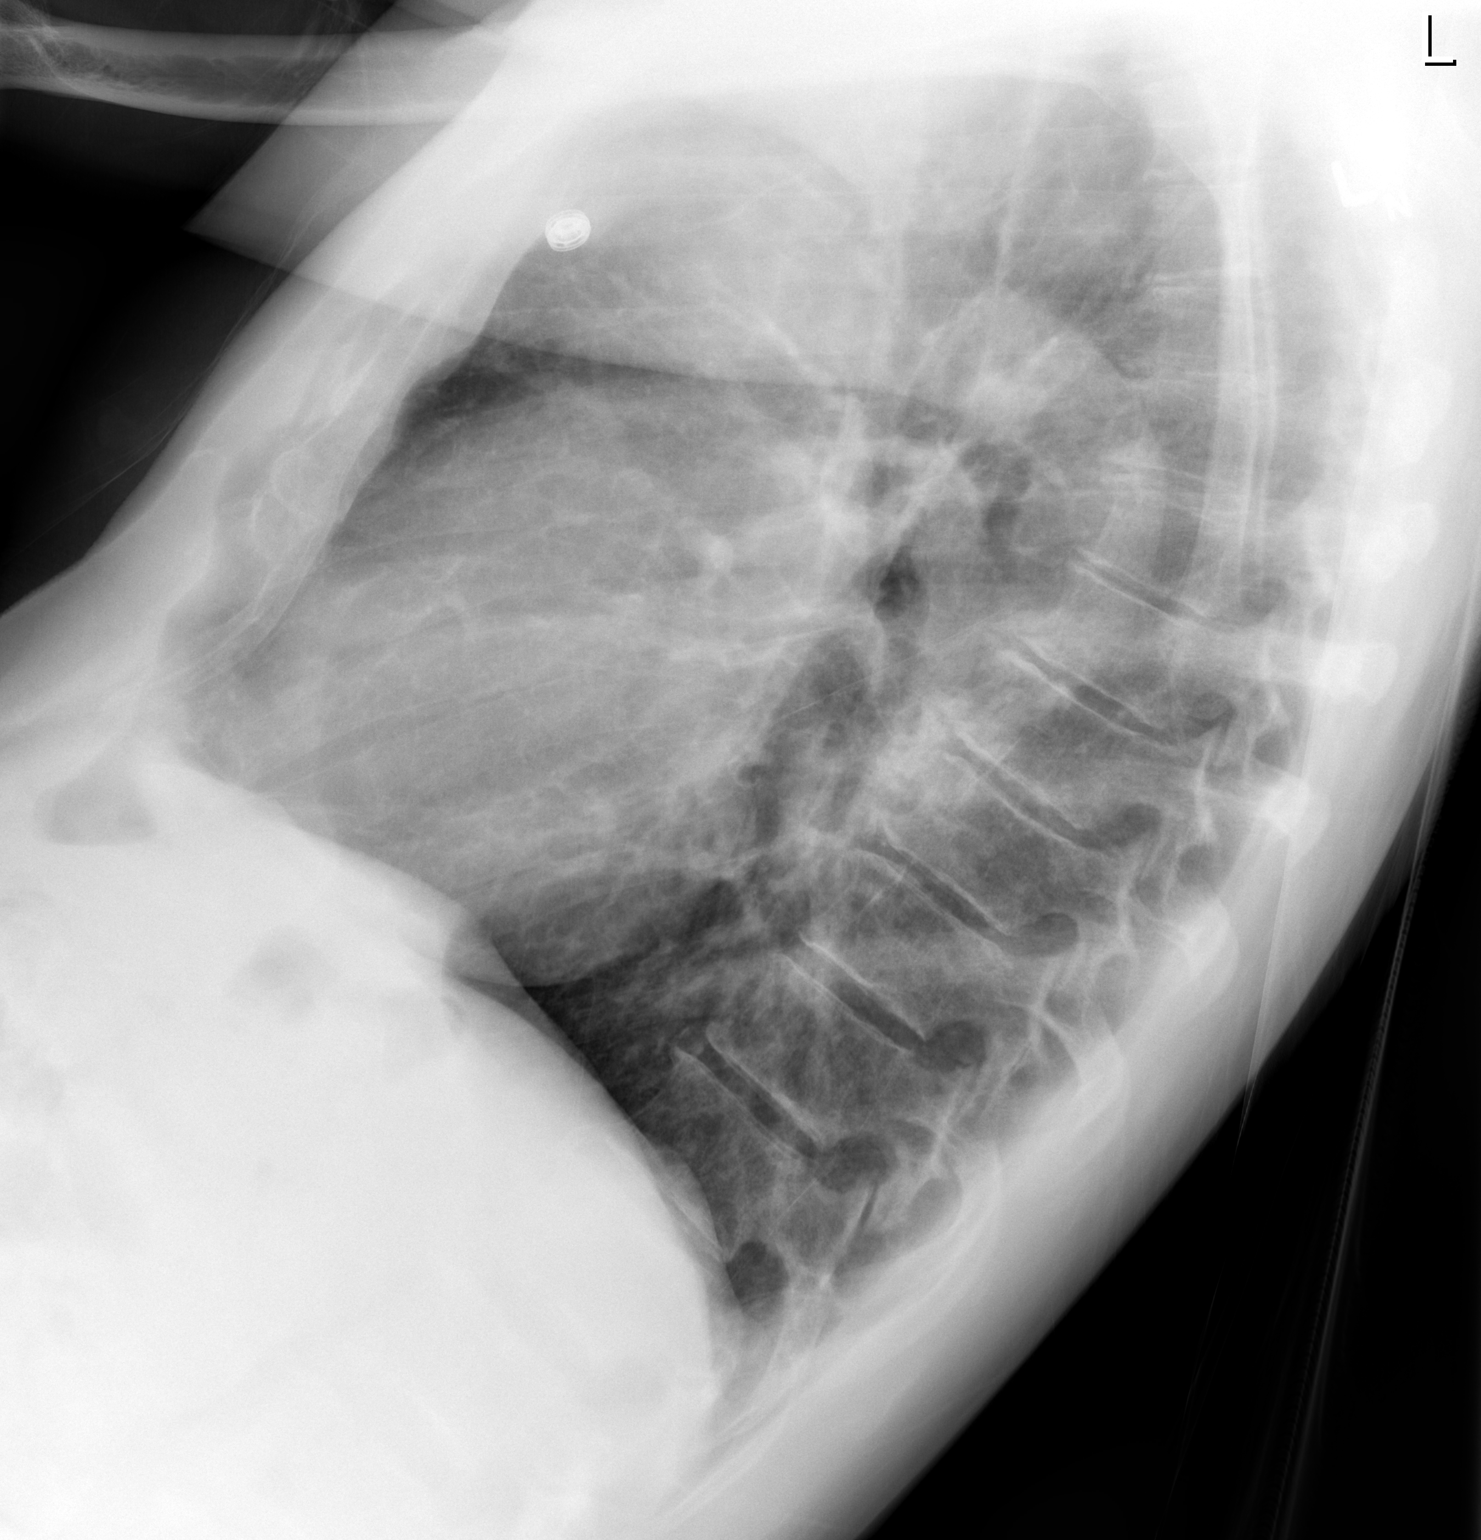

[2 of 2 positions shown; findings below may reference images not displayed]

FINDINGS: The lungs are clear.  No edema, infiltrate, pneumothorax
or pleural fluid identified.  The heart size and mediastinal
contours are within normal limits.  Bony thorax shows mild
degenerative changes of the thoracic spine.
IMPRESSION: No active disease.

## 2014-09-09 IMAGING — CR DG FINGER THUMB 2+V*R*
3 series · 3 of 3 positions shown · non-contrast
Comparison: Right hand radiographs 04/13/2010

CLINICAL DATA: Pain and laceration right thumb

RIGHT THUMB 2+V

[view not recorded (1 of 3)]
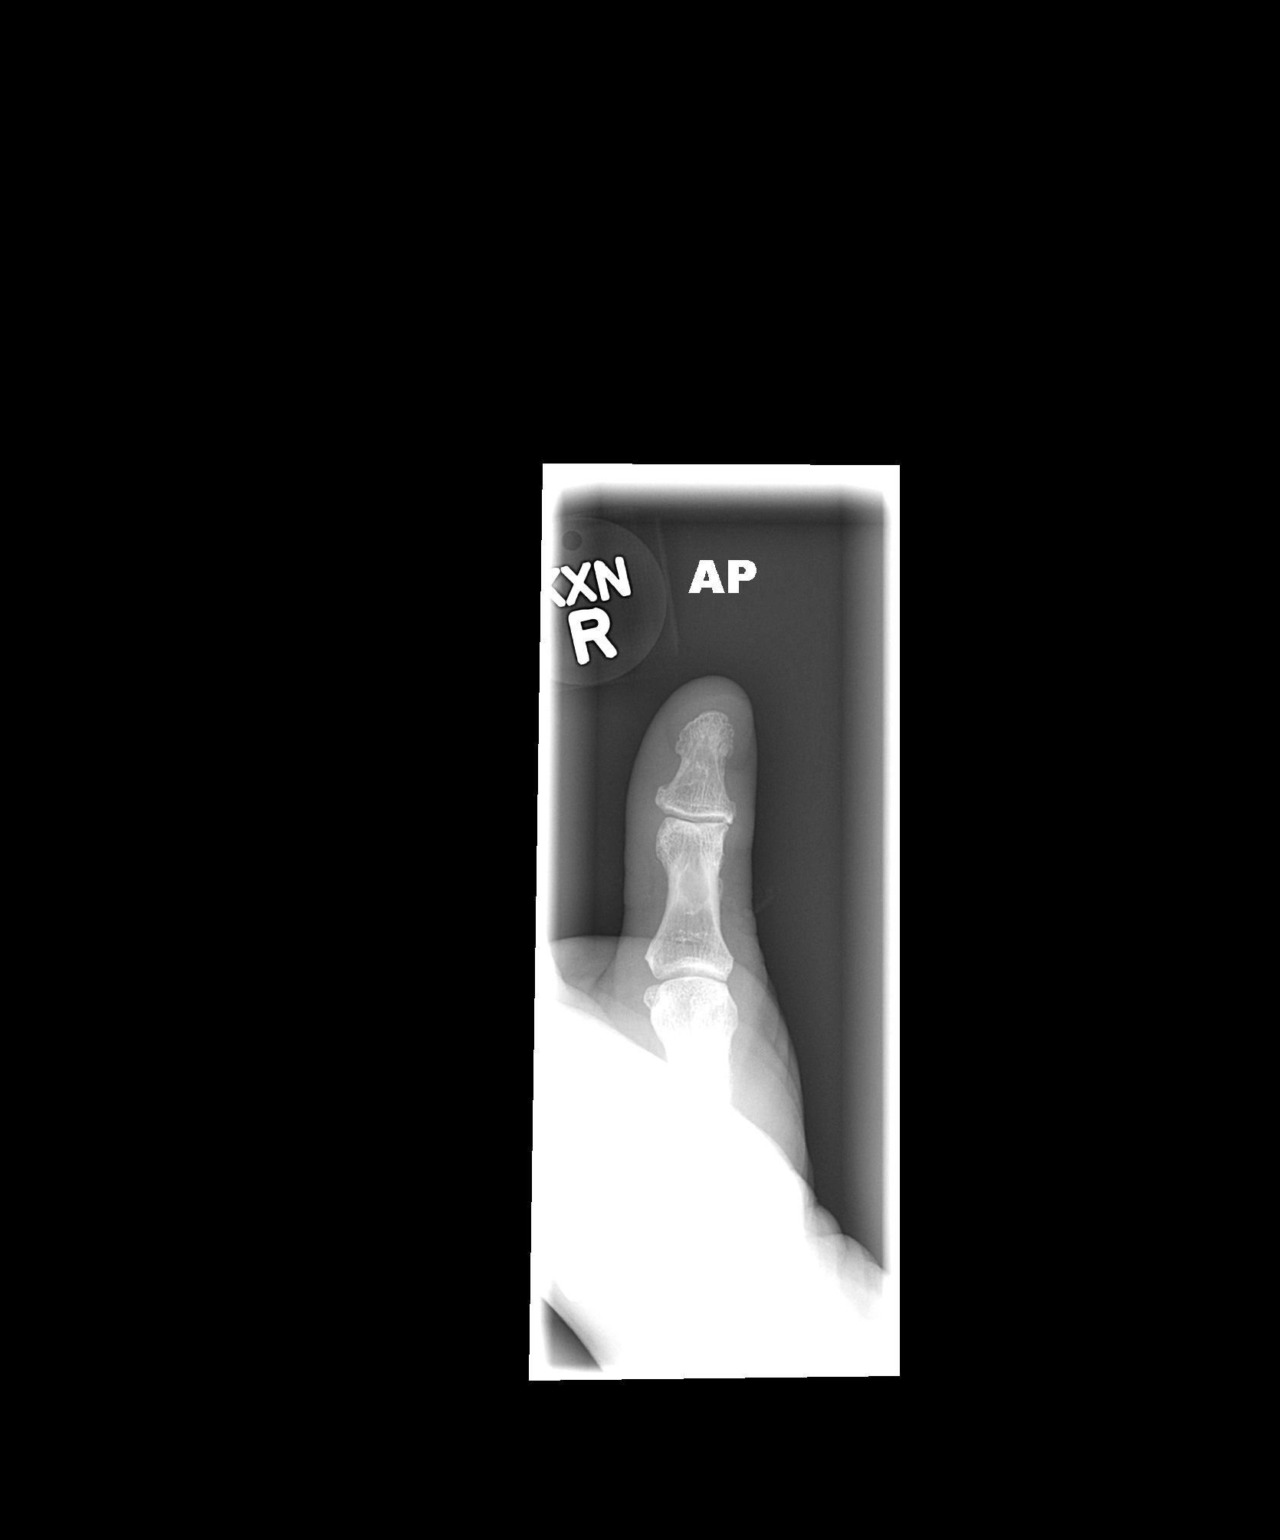

[view not recorded (2 of 3)]
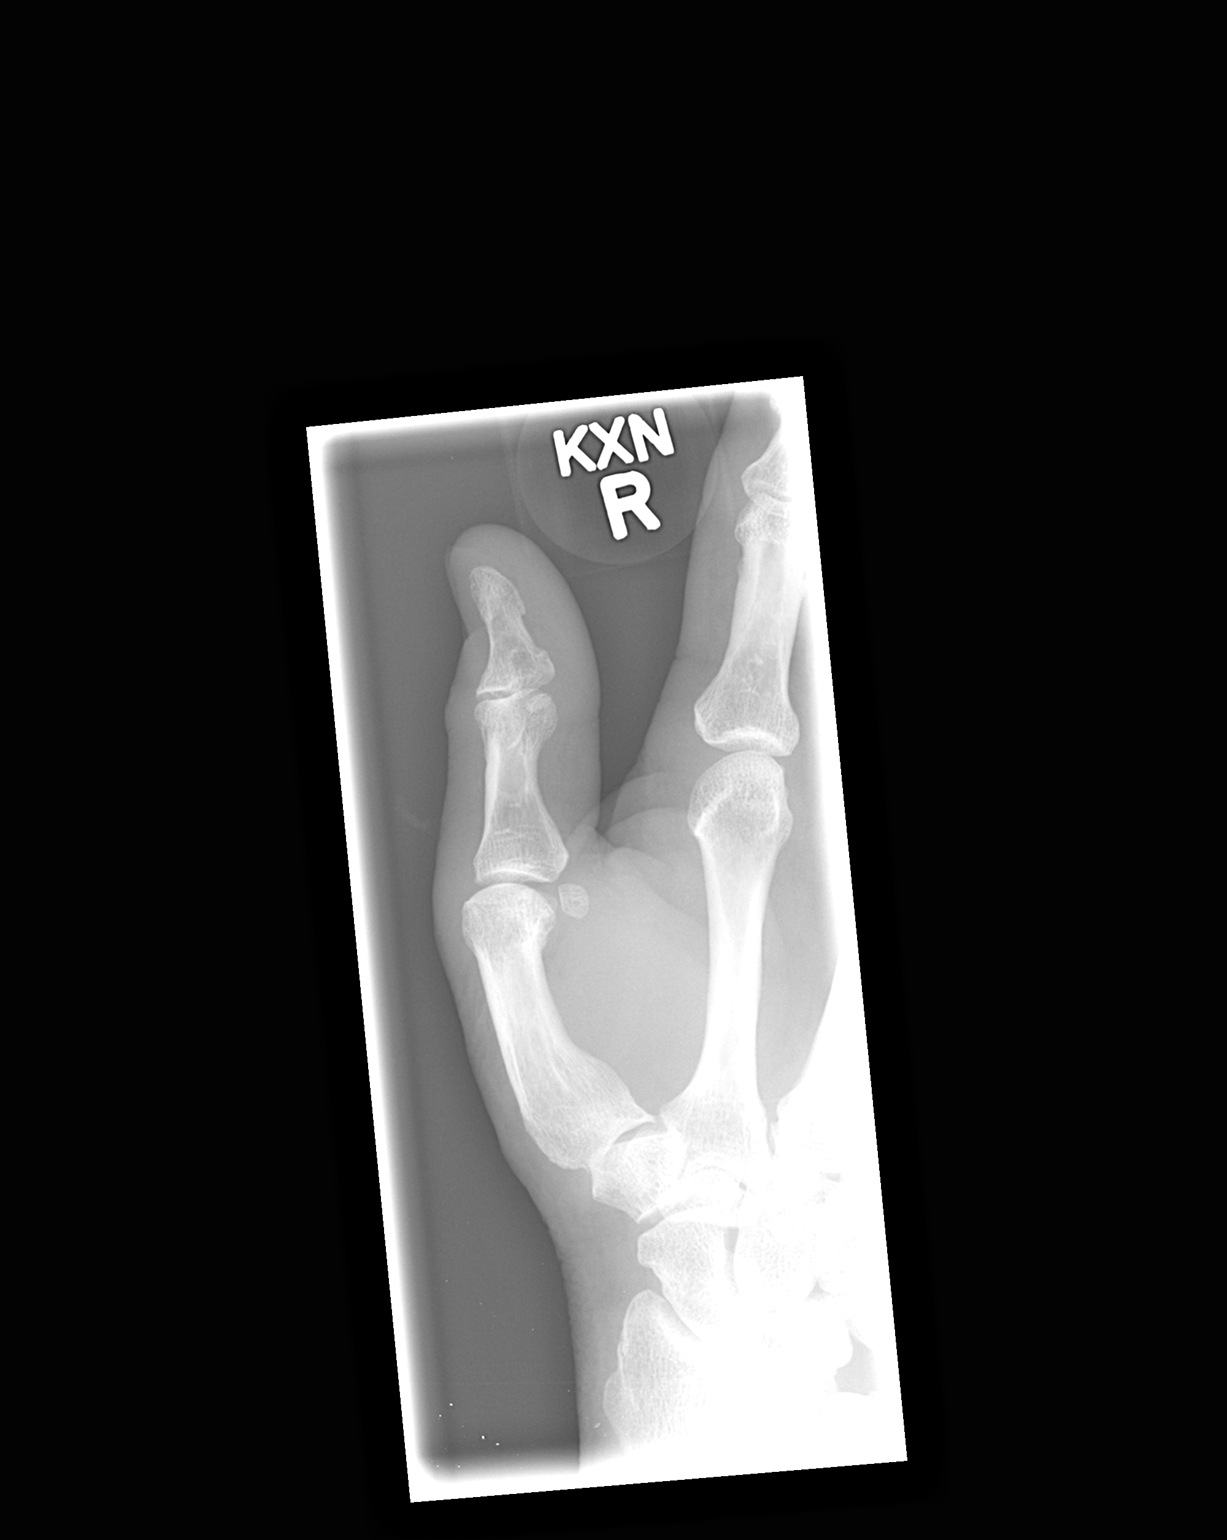

[view not recorded (3 of 3)]
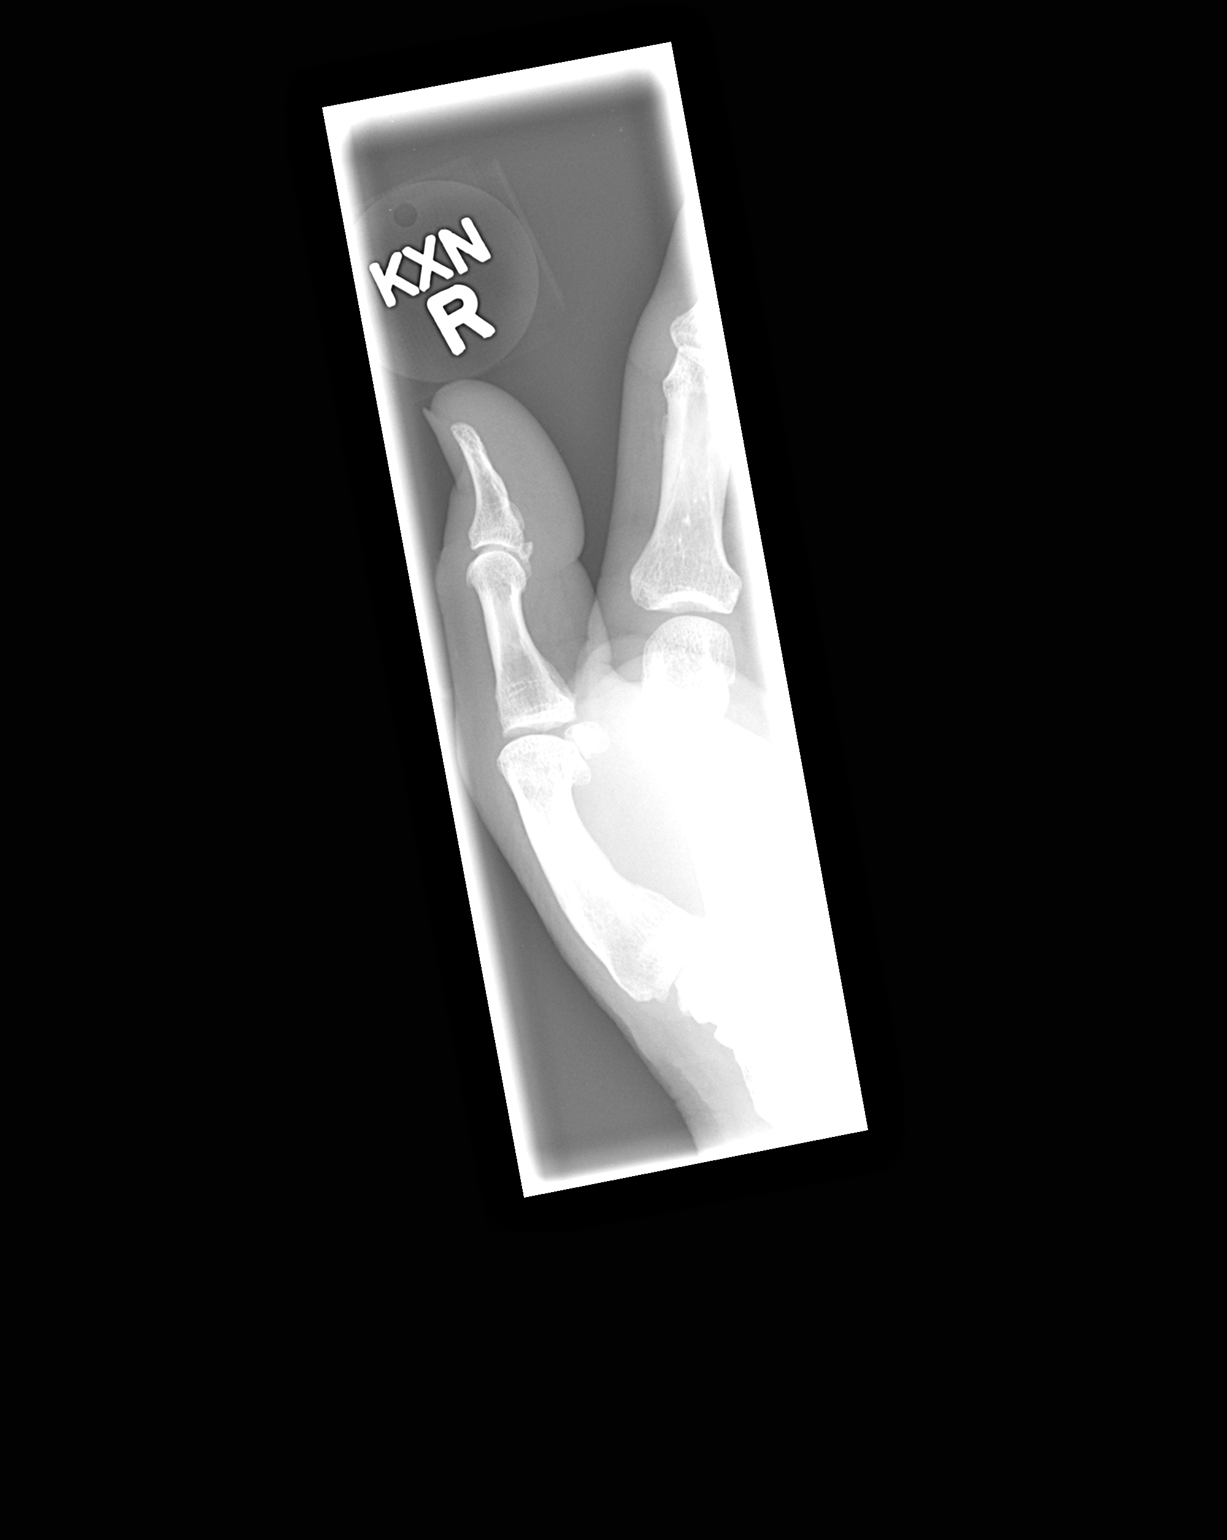

[3 of 3 positions shown; findings below may reference images not displayed]

FINDINGS: Osseous mineralization normal.
Joint spaces preserved.
No acute fracture, dislocation or bone destruction.
IMPRESSION: No acute osseous abnormalities.

## 2014-09-09 IMAGING — CT CT CERVICAL SPINE W/O CM
4 of 5 series · 16 of 33 positions shown, 19 images · non-contrast
Comparison: 05/11/2010.

CT HEAD

CLINICAL DATA: Fall with left eyebrow laceration and hematoma with
bruising.

CT HEAD WITHOUT CONTRAST
CT CERVICAL SPINE WITHOUT CONTRAST
TECHNIQUE: Multidetector CT imaging of the head and cervical spine
was performed following the standard protocol without intravenous
contrast.  Multiplanar CT image reconstructions of the cervical
spine were also generated.

[Series 6: c_spine 2.0 b31s detail · axial · 0.30mm/px · z∈[-328,-202]mm · 5 of 95 slices shown, 7 images]
[im 16/95  soft-tissue]
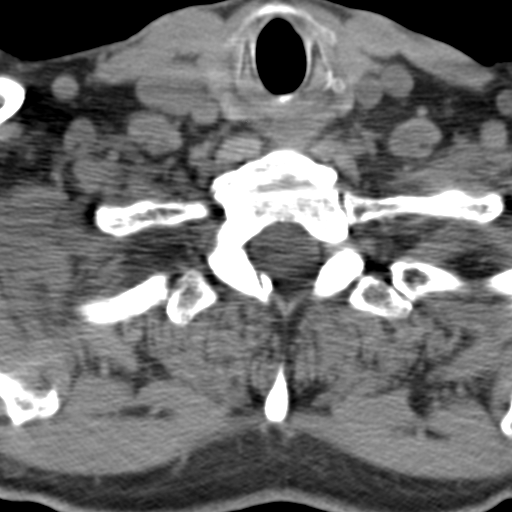
[im 16/95  bone]
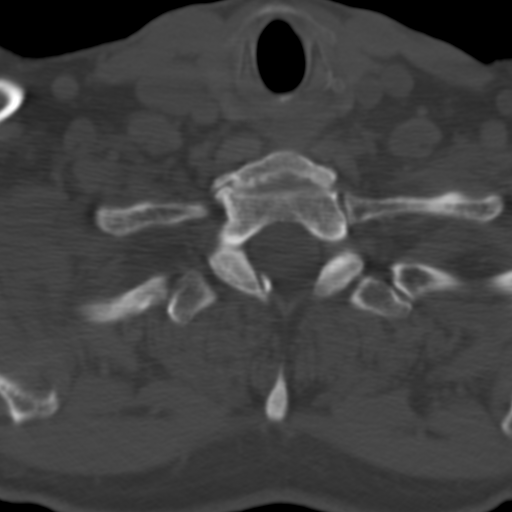
[im 32/95  bone]
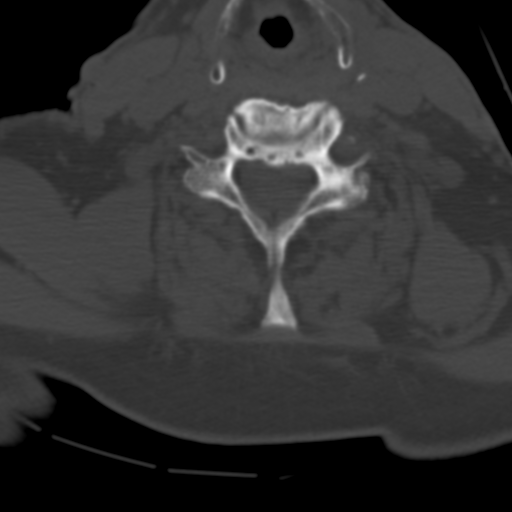
[im 48/95  bone]
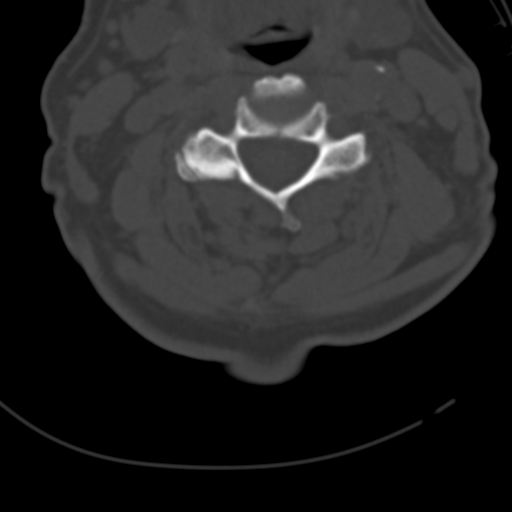
[im 63/95  bone]
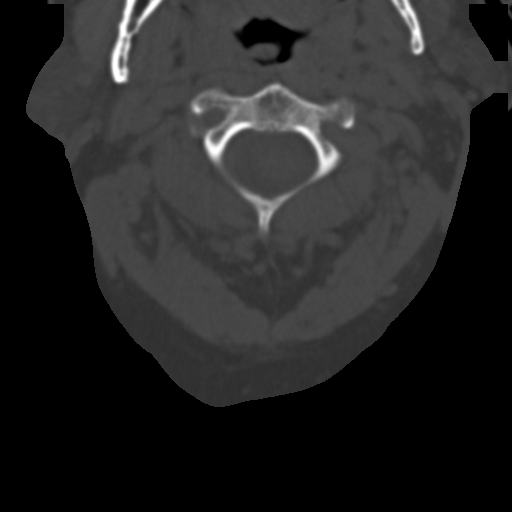
[im 79/95  soft-tissue]
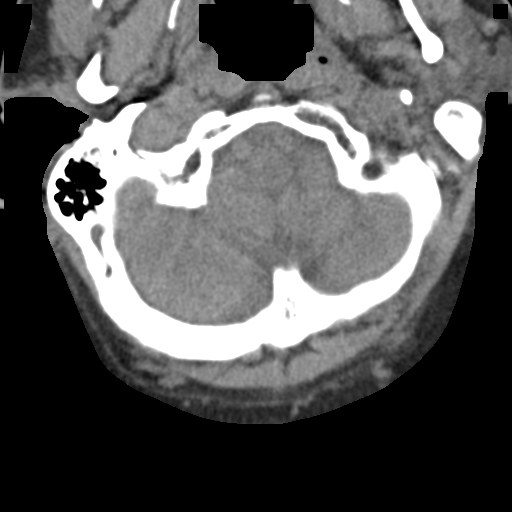
[im 79/95  bone]
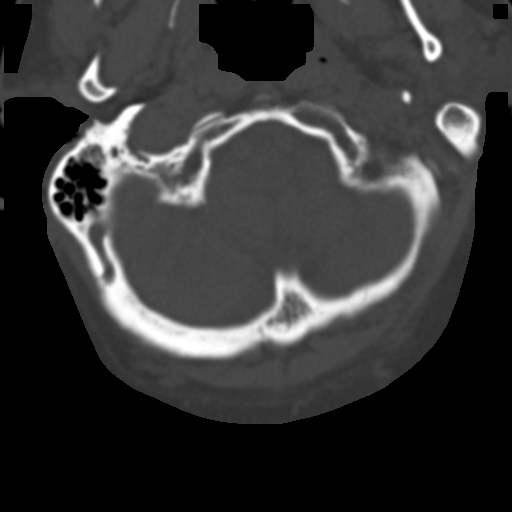

[Series 8: coronals · coronal · 0.36mm/px · 3 of 46 slices shown]
[im 10/46  bone]
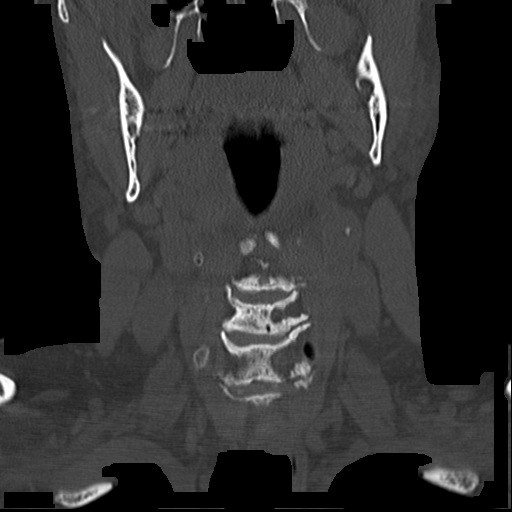
[im 19/46  bone]
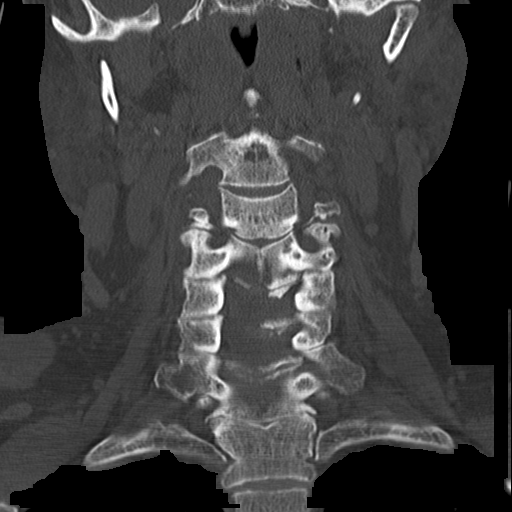
[im 28/46  bone]
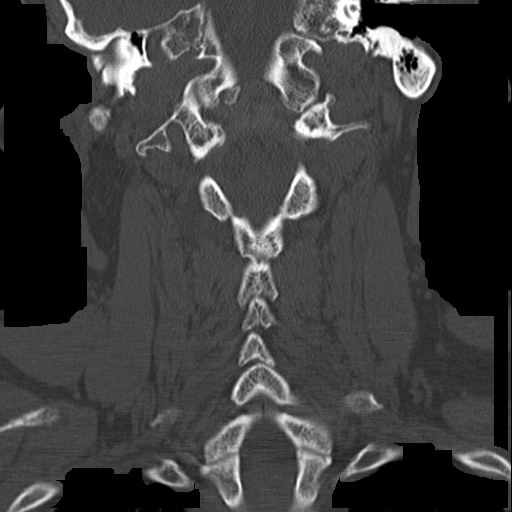

[Series 9: sagittals · sagittal · 0.34mm/px · 5 of 40 slices shown, 6 images]
[im 14/40  bone]
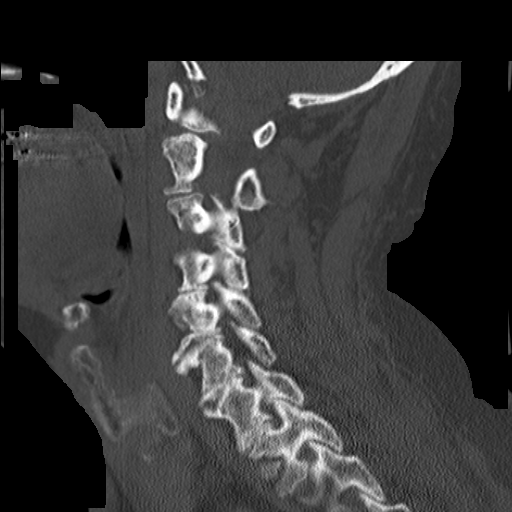
[im 17/40  bone]
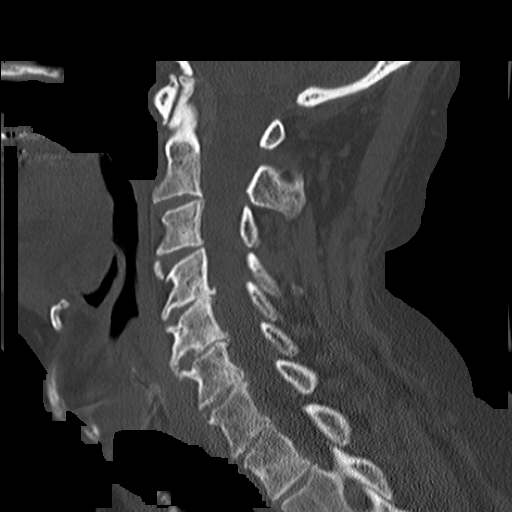
[im 20/40  soft-tissue]
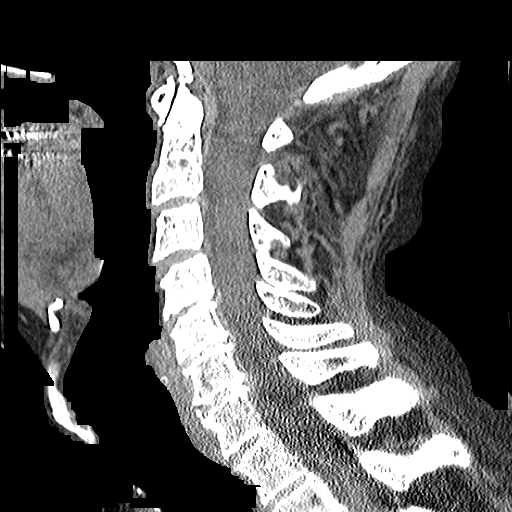
[im 20/40  bone]
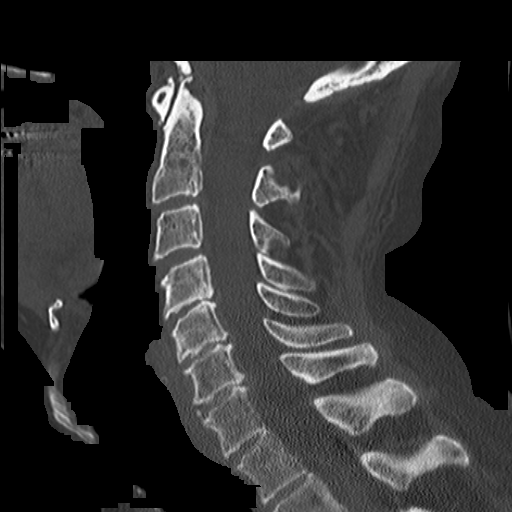
[im 23/40  bone]
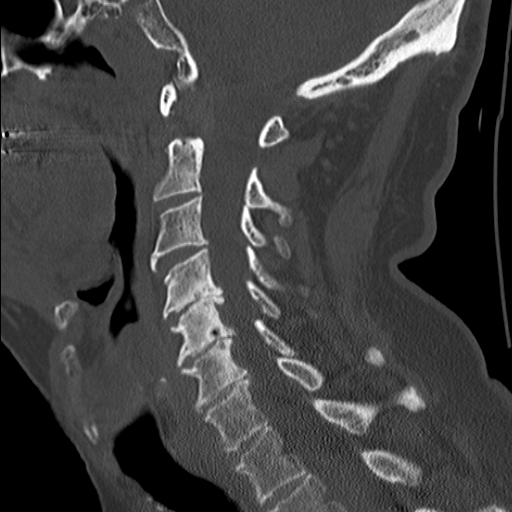
[im 27/40  bone]
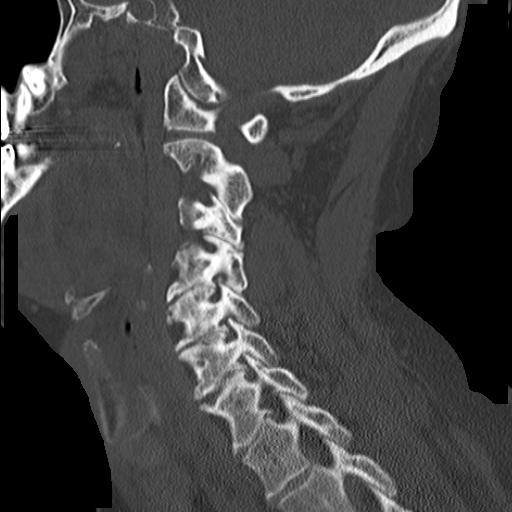

[Series 10: orthogonals · axial · 0.32mm/px · z∈[-330,-258]mm · 3 of 78 slices shown]
[im 20/78  bone]
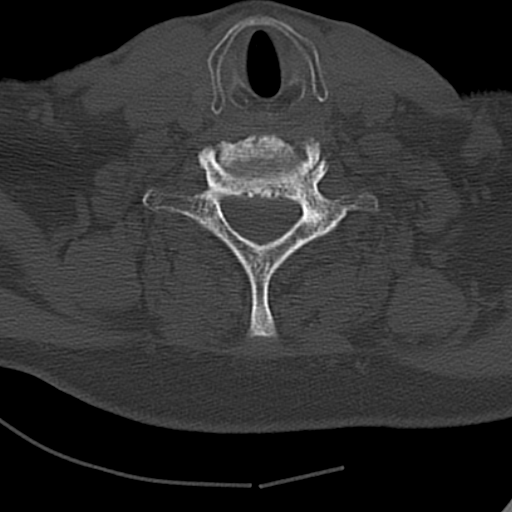
[im 39/78  bone]
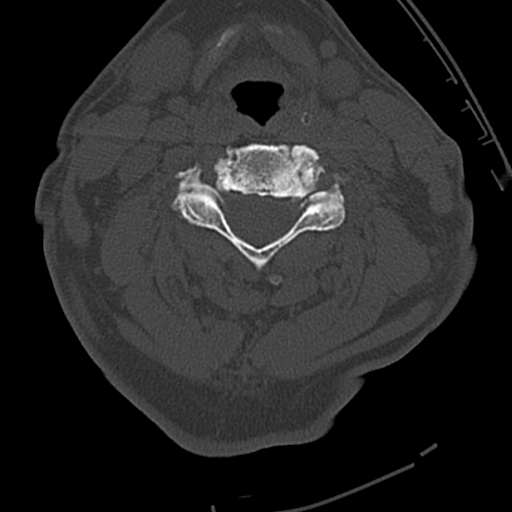
[im 58/78  bone]
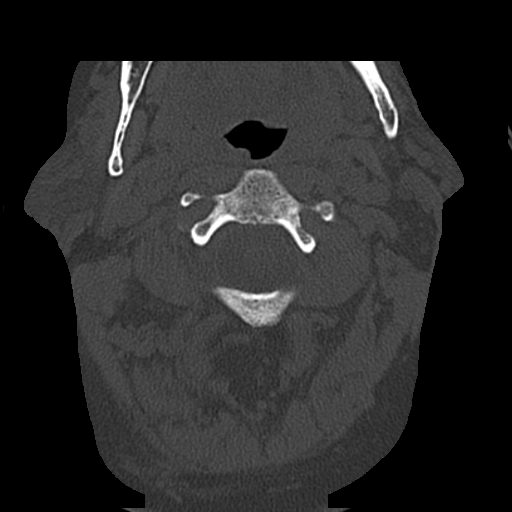

[16 of 33 positions shown; findings below may reference images not displayed]

FINDINGS: No evidence of acute infarct, acute hemorrhage, mass
lesion, mass effect or hydrocephalus.  Atrophy.  Mild
periventricular low attenuation.  Remote infarct in the posterior
limb of the left internal capsule.  No air fluid levels in the
visualized portions of the paranasal sinuses and mastoid air cells.
IMPRESSION: 1.  No acute renal abnormality.
2.  Atrophy and chronic microvascular white matter ischemic
changes.

CT CERVICAL SPINE
FINDINGS: Alignment is anatomic.  No fracture.  There are
multilevel endplate degenerative changes with uncovertebral
hypertrophy and facet sclerosis as well as loss of disc space
height.  Findings are worst at C4-5 through C6-7.

At C3-4, moderate to severe left neural foraminal narrowing.

At C4-5, severe left, and moderate right, neural foraminal
narrowing.

At C5-6, severe left, and moderate right neural foraminal
narrowing.

At C6-7, severe bilateral neural foraminal narrowing.

At C7-T1, severe right neural foraminal narrowing.

Visualized lung apices show emphysema and scarring.  Soft tissues
are unremarkable.
IMPRESSION: 1.  No evidence of acute fracture or subluxation.
2.  Multilevel spondylosis.

## 2015-07-06 ENCOUNTER — Encounter (HOSPITAL_COMMUNITY): Payer: Self-pay | Admitting: Emergency Medicine

## 2015-07-06 ENCOUNTER — Emergency Department (HOSPITAL_COMMUNITY)
Admission: EM | Admit: 2015-07-06 | Discharge: 2015-07-07 | Disposition: A | Discharge status: Home / Self Care | Payer: No Typology Code available for payment source | Attending: Emergency Medicine | Admitting: Emergency Medicine

## 2015-07-06 DIAGNOSIS — R42 Dizziness and giddiness: Secondary | ICD-10-CM | POA: Insufficient documentation

## 2015-07-06 DIAGNOSIS — R5383 Other fatigue: Secondary | ICD-10-CM | POA: Insufficient documentation

## 2015-07-06 DIAGNOSIS — S0993XA Unspecified injury of face, initial encounter: Secondary | ICD-10-CM | POA: Insufficient documentation

## 2015-07-06 DIAGNOSIS — F17210 Nicotine dependence, cigarettes, uncomplicated: Secondary | ICD-10-CM | POA: Insufficient documentation

## 2015-07-06 DIAGNOSIS — W1839XA Other fall on same level, initial encounter: Secondary | ICD-10-CM | POA: Insufficient documentation

## 2015-07-06 DIAGNOSIS — Y9289 Other specified places as the place of occurrence of the external cause: Secondary | ICD-10-CM | POA: Insufficient documentation

## 2015-07-06 DIAGNOSIS — Z590 Homelessness: Secondary | ICD-10-CM | POA: Insufficient documentation

## 2015-07-06 DIAGNOSIS — Y9389 Activity, other specified: Secondary | ICD-10-CM | POA: Insufficient documentation

## 2015-07-06 DIAGNOSIS — I10 Essential (primary) hypertension: Secondary | ICD-10-CM | POA: Insufficient documentation

## 2015-07-06 DIAGNOSIS — Y998 Other external cause status: Secondary | ICD-10-CM | POA: Insufficient documentation

## 2015-07-06 LAB — COMPREHENSIVE METABOLIC PANEL
ALK PHOS: 61 U/L (ref 38–126)
ALT: 13 U/L — ABNORMAL LOW (ref 17–63)
AST: 21 U/L (ref 15–41)
Albumin: 4 g/dL (ref 3.5–5.0)
Anion gap: 11 (ref 5–15)
BUN: <5 mg/dL — ABNORMAL LOW (ref 6–20)
CALCIUM: 8.8 mg/dL — AB (ref 8.9–10.3)
CO2: 24 mmol/L (ref 22–32)
Chloride: 97 mmol/L — ABNORMAL LOW (ref 101–111)
Creatinine, Ser: 0.68 mg/dL (ref 0.61–1.24)
GFR calc Af Amer: >60 mL/min (ref >60)
Glucose, Bld: 89 mg/dL (ref 65–99)
POTASSIUM: 4 mmol/L (ref 3.5–5.1)
Sodium: 132 mmol/L — ABNORMAL LOW (ref 135–145)
TOTAL PROTEIN: 7.3 g/dL (ref 6.5–8.1)
Total Bilirubin: 0.4 mg/dL (ref 0.3–1.2)

## 2015-07-06 LAB — CBC WITH DIFFERENTIAL/PLATELET
BASOS ABS: 0 10*3/uL (ref 0.0–0.1)
Basophils Relative: 1 %
Eosinophils Absolute: 0.1 10*3/uL (ref 0.0–0.7)
Eosinophils Relative: 3 %
HEMATOCRIT: 44.7 % (ref 39.0–52.0)
HEMOGLOBIN: 15.4 g/dL (ref 13.0–17.0)
LYMPHS PCT: 26 %
Lymphs Abs: 1.1 10*3/uL (ref 0.7–4.0)
MCH: 34.4 pg — ABNORMAL HIGH (ref 26.0–34.0)
MCHC: 34.5 g/dL (ref 30.0–36.0)
MCV: 99.8 fL (ref 78.0–100.0)
MONO ABS: 0.3 10*3/uL (ref 0.1–1.0)
MONOS PCT: 7 %
NEUTROS ABS: 2.8 10*3/uL (ref 1.7–7.7)
NEUTROS PCT: 63 %
Platelets: 234 10*3/uL (ref 150–400)
RBC: 4.48 MIL/uL (ref 4.22–5.81)
RDW: 12.8 % (ref 11.5–15.5)
WBC: 4.3 10*3/uL (ref 4.0–10.5)

## 2015-07-06 NOTE — ED Notes (Signed)
Pt. fell twice this week ( yesterday and this afternoon ) reports unsteady gait/dizziness and fatigue  , presents with pain at left forehead with dried blood at left lateral eyelid . Alert and oriented at arrival / respirations unlabored .

## 2015-07-06 NOTE — ED Notes (Signed)
Called for room, no answer. Pt was seen pushed out of the ED in a wheelchair by family

## 2015-07-06 NOTE — ED Notes (Signed)
Pt threatening to leave.

## 2015-07-31 ENCOUNTER — Encounter (HOSPITAL_COMMUNITY): Payer: Self-pay | Admitting: Emergency Medicine

## 2015-07-31 ENCOUNTER — Emergency Department (HOSPITAL_COMMUNITY)
Admission: EM | Admit: 2015-07-31 | Discharge: 2015-08-01 | Disposition: A | Discharge status: Home / Self Care | Payer: Self-pay | Attending: Emergency Medicine | Admitting: Emergency Medicine

## 2015-07-31 DIAGNOSIS — S301XXA Contusion of abdominal wall, initial encounter: Secondary | ICD-10-CM | POA: Insufficient documentation

## 2015-07-31 DIAGNOSIS — Z79891 Long term (current) use of opiate analgesic: Secondary | ICD-10-CM | POA: Insufficient documentation

## 2015-07-31 DIAGNOSIS — Y92009 Unspecified place in unspecified non-institutional (private) residence as the place of occurrence of the external cause: Secondary | ICD-10-CM | POA: Insufficient documentation

## 2015-07-31 DIAGNOSIS — Y999 Unspecified external cause status: Secondary | ICD-10-CM | POA: Insufficient documentation

## 2015-07-31 DIAGNOSIS — F17210 Nicotine dependence, cigarettes, uncomplicated: Secondary | ICD-10-CM | POA: Insufficient documentation

## 2015-07-31 DIAGNOSIS — W19XXXA Unspecified fall, initial encounter: Secondary | ICD-10-CM | POA: Insufficient documentation

## 2015-07-31 DIAGNOSIS — Z79899 Other long term (current) drug therapy: Secondary | ICD-10-CM | POA: Insufficient documentation

## 2015-07-31 DIAGNOSIS — Z791 Long term (current) use of non-steroidal anti-inflammatories (NSAID): Secondary | ICD-10-CM | POA: Insufficient documentation

## 2015-07-31 DIAGNOSIS — F1010 Alcohol abuse, uncomplicated: Secondary | ICD-10-CM

## 2015-07-31 DIAGNOSIS — F10120 Alcohol abuse with intoxication, uncomplicated: Secondary | ICD-10-CM | POA: Insufficient documentation

## 2015-07-31 DIAGNOSIS — F329 Major depressive disorder, single episode, unspecified: Secondary | ICD-10-CM | POA: Insufficient documentation

## 2015-07-31 DIAGNOSIS — E7800 Pure hypercholesterolemia, unspecified: Secondary | ICD-10-CM | POA: Insufficient documentation

## 2015-07-31 DIAGNOSIS — Z8673 Personal history of transient ischemic attack (TIA), and cerebral infarction without residual deficits: Secondary | ICD-10-CM | POA: Insufficient documentation

## 2015-07-31 DIAGNOSIS — I10 Essential (primary) hypertension: Secondary | ICD-10-CM | POA: Insufficient documentation

## 2015-07-31 DIAGNOSIS — Y939 Activity, unspecified: Secondary | ICD-10-CM | POA: Insufficient documentation

## 2015-07-31 DIAGNOSIS — Z7951 Long term (current) use of inhaled steroids: Secondary | ICD-10-CM | POA: Insufficient documentation

## 2015-07-31 DIAGNOSIS — Z792 Long term (current) use of antibiotics: Secondary | ICD-10-CM | POA: Insufficient documentation

## 2015-07-31 NOTE — ED Notes (Signed)
Pt from home with EMS with complaints of generalized pain from chronic falls. Pt consumes an unspecified amount of etoh every day. Pt states he has a roommate who takes him to the store to get liquor. Pt is alert and oriented x 4. Pt states he has fallen on his right side and his bottom.

## 2015-08-01 ENCOUNTER — Emergency Department (HOSPITAL_COMMUNITY): Payer: Self-pay

## 2015-08-01 ENCOUNTER — Encounter (HOSPITAL_COMMUNITY): Payer: Self-pay

## 2015-08-01 LAB — CBC WITH DIFFERENTIAL/PLATELET
BASOS ABS: 0 10*3/uL (ref 0.0–0.1)
BASOS PCT: 0 %
Eosinophils Absolute: 0.1 10*3/uL (ref 0.0–0.7)
Eosinophils Relative: 3 %
HCT: 43.2 % (ref 39.0–52.0)
HEMOGLOBIN: 15.1 g/dL (ref 13.0–17.0)
Lymphocytes Relative: 30 %
Lymphs Abs: 1.2 10*3/uL (ref 0.7–4.0)
MCH: 34.7 pg — ABNORMAL HIGH (ref 26.0–34.0)
MCHC: 35 g/dL (ref 30.0–36.0)
MCV: 99.3 fL (ref 78.0–100.0)
Monocytes Absolute: 0.3 10*3/uL (ref 0.1–1.0)
Monocytes Relative: 8 %
NEUTROS PCT: 59 %
Neutro Abs: 2.3 10*3/uL (ref 1.7–7.7)
Platelets: 201 10*3/uL (ref 150–400)
RBC: 4.35 MIL/uL (ref 4.22–5.81)
RDW: 12.3 % (ref 11.5–15.5)
WBC: 3.9 10*3/uL — ABNORMAL LOW (ref 4.0–10.5)

## 2015-08-01 LAB — COMPREHENSIVE METABOLIC PANEL
ALT: 11 U/L — ABNORMAL LOW (ref 17–63)
ANION GAP: 9 (ref 5–15)
AST: 17 U/L (ref 15–41)
Albumin: 4.3 g/dL (ref 3.5–5.0)
Alkaline Phosphatase: 69 U/L (ref 38–126)
BUN: <5 mg/dL — ABNORMAL LOW (ref 6–20)
CALCIUM: 8.8 mg/dL — AB (ref 8.9–10.3)
CO2: 25 mmol/L (ref 22–32)
Chloride: 95 mmol/L — ABNORMAL LOW (ref 101–111)
Creatinine, Ser: 0.67 mg/dL (ref 0.61–1.24)
GFR calc non Af Amer: >60 mL/min (ref >60)
Glucose, Bld: 88 mg/dL (ref 65–99)
Potassium: 4 mmol/L (ref 3.5–5.1)
SODIUM: 129 mmol/L — AB (ref 135–145)
TOTAL PROTEIN: 7.1 g/dL (ref 6.5–8.1)
Total Bilirubin: 0.6 mg/dL (ref 0.3–1.2)

## 2015-08-01 LAB — URINALYSIS, ROUTINE W REFLEX MICROSCOPIC
Bilirubin Urine: NEGATIVE
Glucose, UA: NEGATIVE mg/dL
Hgb urine dipstick: NEGATIVE
Ketones, ur: NEGATIVE mg/dL
NITRITE: NEGATIVE
Protein, ur: NEGATIVE mg/dL
SPECIFIC GRAVITY, URINE: 1.005 (ref 1.005–1.030)
pH: 5 (ref 5.0–8.0)

## 2015-08-01 LAB — URINE MICROSCOPIC-ADD ON
BACTERIA UA: NONE SEEN
RBC / HPF: NONE SEEN RBC/hpf (ref 0–5)

## 2015-08-01 LAB — VITAMIN B12: VITAMIN B 12: 191 pg/mL (ref 180–914)

## 2015-08-01 LAB — ETHANOL: ALCOHOL ETHYL (B): 246 mg/dL — AB (ref <5)

## 2015-08-01 MED ORDER — THIAMINE HCL 100 MG/ML IJ SOLN
Freq: Once | INTRAVENOUS | Status: AC
  Administered 2015-08-01: 02:00:00 via INTRAVENOUS
  Filled 2015-08-01: qty 1000

## 2015-08-01 MED ORDER — IOPAMIDOL (ISOVUE-300) INJECTION 61%
100.0000 mL | Freq: Once | INTRAVENOUS | Status: AC | PRN
  Administered 2015-08-01: 100 mL via INTRAVENOUS

## 2015-08-01 MED ORDER — VITAMIN B-1 100 MG PO TABS
100.0000 mg | ORAL_TABLET | Freq: Once | ORAL | Status: AC
  Administered 2015-08-01: 100 mg via ORAL
  Filled 2015-08-01: qty 1

## 2015-08-01 NOTE — ED Notes (Signed)
In CT

## 2015-08-01 NOTE — ED Notes (Signed)
Ambulated pt with provider 9 utilized a walker. Pt was slow but ok steady on observation assessment

## 2015-08-01 NOTE — Discharge Instructions (Signed)
Tylenol or motrin for pain - reduce the amount that you drink - see the attached list for mental health resources including substance abuse treatment.Community Resource Guide Outpatient Counseling/Substance Abuse Adult The United Ways 211 is a great source of information about community services available.  Access by dialing 2-1-1 from anywhere in [place], or by website -  PooledIncome.plwww.nc211.org.   Other Local Resources (Updated 03/2015)  Crisis Hotlines   Services     Area Served  [facility]  Crisis Hotline, available 24 hours a day, 7 days a week: [phone] [facility], [place]   [facility]  Crisis Hotline, available 24 hours a day, 7 days a week: [phone] [facility] [place], [place]  [facility]  Suicide Prevention Hotline, available 24 hours a day, 7 days a week: [phone] [facility] [place], [place]  [facility]   Crisis Hotline, available 24 hours a day, 7 days a week: [phone] [facility] [place], [place]   [facility] Access to [employer] [facility]  Crisis Hotline, available 24 hours a day, 7 days a week: [phone] All   [facility]  Crisis Hotline, available 24 hours a day, 7 days a week: (646) 221-033[phone] All   Other Local Resources (Updated 03/2015)  Outpatient Counseling/ Substance Abuse Programs  Services     Address and Phone Number  ADS (Alcohol and Drug Services)   Options include Individual counseling, group counseling, intensive outpatient program (several hours a day, several days a week)  Offers depression assessments  Provides methadone maintenance program [phone] [address], [address] [place], [place] [address]   [facility]   Offers partial hospitalization/day treatment and DUI/DWI programs  [employer] Medicare, private insurance [phone] [address], [address] [place], [place] [address]  [facility]    Services include intensive outpatient program (several hours a day, several days  a week), outpatient treatment, DUI/DWI services, family education  Also has some services specifically for [employer]  Offers transitional housing  [phone] [phone] [place], [place] [address]     [facility]  [employer]s Medicare, private pay, and private insurance [phone] [address], [address] [place], [place] [address]  [facility]  Services include individual counseling, substance abuse intensive outpatient program (several hours a day, several days a week), day treatment  Delene Lollccepts Medicare, Medicaid, private insurance [phone] [address], [address] [place], [place] [address]  [facility]   Offers substance abuse intensive outpatient program (several hours a day, several days a week), partial hospitalization program [phone] [address] [place], [place] [address]  [phone] [address] [place], [place] [address]  [phone] [address] [place], [place] [address]  [phone] [address] [place], [place] [address]  [facility]  Individual counseling only  Accepts private insurance only [phone] [address], [address] [place], [place] [address]  [facility]  Methadone maintenance program [phone] [address] [place], [place] [address]  [facility]  Walk-In Clinic providing substance abuse and mental health counseling  Accepts Medicaid, Medicare, private insurance  Offers sliding scale for uninsured [phone] [address] [place], [place]   [facility] [employer]  Offers individual counseling, and intensive in-home services [phone] [address], [address] [place], [place] [address]  [facility]  Offers individual counseling, family counseling, group therapy, domestic violence counseling, consumer credit counseling  Accepts Medicare, Medicaid,  private insurance  Offers sliding scale for uninsured [phone] [place], [place] [address]  [phone] [facility], [address] [place], [place] [address]  [facility]  Offers individual, family and group counseling  3 locations - [place], [place], and [place]  [phone]  [address] [place], [place] [address]  [address] [place], [place] [address]  [address] [place], [place] [address]  [facility]    Offers psychiatric assessment, 8-week Intensive Outpatient Program (several hours a day, several times a week, daytime or evenings), early recovery group, family Program, medication management  Private pay or private insurance only [phone], or  [phone] [address] [place], [place] [address]  [facility] [employer] individual, couples and family counseling  Accepts Medicaid, private insurance, and sliding scale for uninsured [phone] [address] [place], [place] [address]  Len Blalock, MD  Individual counseling  Private insurance [phone] [address] [place], [place] [address]  [facility]   Offers assessment, substance abuse treatment, and behavioral health treatment [phone] [address] [place], [place] [address]  [facility]  Individual counseling  Accepts private insurance [phone] [address] [place], [place] [address]  [facility]  Individual counseling  Delene Loll, private insurance [phone] [address] [place], [place] [address]  [facility]    Offers intensive outpatient program (several hours a day, several times a week)  Private pay, private insurance [phone] [facility] [place], [place]  [facility]  Individual counseling  Medicare, private insurance [phone] [address], [address] [place], [place] [address]   [facility]    Offers intensive outpatient program (several hours a day, several times a week) and partial hospitalization program [phone] [address] [place], [place] [address]  Emerson Monte, MD  Individual counseling [phone] [address], [address] [place], [place] [address]  [facility]  Offers Christian counseling to individuals, couples, and families  Accepts Medicare and private insurance; offers sliding scale for uninsured [phone] [address] [place], [place] [address]  [facility]  [place] counseling [phone] [address] [place], [place] [address]  [facility] crisis counseling, individual counseling, group therapy, in-home therapy, domestic violence services, day treatment, DWI services, Administrator, arts (CST), Assertive Community Treatment Team (ACTT), substance abuse Intensive Outpatient Program (several hours a day, several times a week)  2 locations - [place] [phone] [address] [place], [place] [address]  [phone] [address] [place], [place] [address]  [facility]     Individual counseling and group therapy  Accepts private insurance, [place], [place] [phone] [address] [place], [place]  [facility]  Offers individual and family counseling  Offers LGBTQ services  Accepts private insurance and private pay [phone] [address] [place], [place] [address]  [facility]    Offers individual counseling, group therapy, and outpatient detox  Accepts private insurance [phone] [address] [place], [place]  [facility]  Individual counseling  Accepts Medicare, private insurance [phone] [address] [place], [place] [address]  [employer]  Individual  counseling  Accepts Medicare, private insurance [phone] [address] [place], [place] [address]  [facility]   Offers substance abuse Intensive Outpatient Program (several hours a day, several times a week) [phone], or [phone] [place], [place]

## 2015-08-01 NOTE — ED Provider Notes (Signed)
CSN: [id number]     Arrival date & time 07/31/15  2141 History  By signing my name below, I, [facility], attest that this documentation has been prepared under the direction and in the presence of Eber Hong, MD. Electronically Signed: Randell Patient, ED Scribe. 08/01/2015. 2:38 AM.   Chief Complaint  Patient presents with  . Alcohol Intoxication  . Fall   The history is provided by the patient. No language interpreter was used.   HPI Comments: [name] [name] is a 65 y.o. male with an hx of ETOH abuse, HTN, depression, homelessness who presents to the Emergency Department complaining of constant, mild generalized body aches, worse on the right abdomen after frequent falls. Pt states that he has an hx of chronic falls due to ETOH abuse. He states that he mostly falls on to the floor in his house and falls both while intoxicated and sober. He reports pain and bruising on his right flank after a fall but is unable to articulate exactly when this pain began. He notes that he lives with a friend who drives him to the store to buy alcoholic beverages but notes that she recently asked the him to move out. Per pt, he drinks 2x 40 oz beers daily but denies drinking any other alcoholic beverages regularly. He reports that he has been a heavy alcohol user for the past 46 years and wishes to cease drinking so that he may pay to live in a assisted living facility. Denies any other symptoms currently.  Past Medical History  Diagnosis Date  . Hypercholesteremia   . Stroke (HCC)   . Hypertension   . Alcohol abuse   . Seizures (HCC)   . Depression   . Homelessness    History reviewed. No pertinent past surgical history. Family History  Problem Relation Age of Onset  . Hypertension Mother   . Heart failure Mother   . Hypertension Father    Social History  Substance Use Topics  . Smoking status: Current Every Day Smoker -- 0.25 packs/day    Types: Cigarettes  . Smokeless tobacco:  Never Used  . Alcohol Use: Yes     Comment: drinks 2 bottles of mouth wash daily/ Patient states he drinks alcohol daily    Review of Systems  Genitourinary: Positive for flank pain.  Musculoskeletal: Positive for myalgias.  Skin: Positive for color change.  All other systems reviewed and are negative.   Allergies  Pollen extract  Home Medications   Prior to Admission medications   Medication Sig Start Date End Date Taking? Authorizing Provider  albuterol (PROVENTIL HFA;VENTOLIN HFA) 108 (90 BASE) MCG/ACT inhaler Inhale 2 puffs into the lungs every 4 (four) hours as needed for wheezing or shortness of breath. 05/16/13   Azalia Bilis, MD  cyclobenzaprine (FLEXERIL) 10 MG tablet Take 1 tablet (10 mg total) by mouth 2 (two) times daily as needed for muscle spasms. Patient not taking: Reported on 08/01/2015 04/17/14   Donnetta Hutching, MD  ibuprofen (ADVIL,MOTRIN) 600 MG tablet Take 1 tablet (600 mg total) by mouth every 8 (eight) hours as needed. Patient not taking: Reported on 04/17/2014 05/16/13   Azalia Bilis, MD  levofloxacin (LEVAQUIN) 500 MG tablet Take 1 tablet (500 mg total) by mouth daily. Patient not taking: Reported on 04/17/2014 05/16/13   Azalia Bilis, MD  oxyCODONE-acetaminophen (PERCOCET) 5-325 MG per tablet Take 1-2 tablets by mouth every 4 (four) hours as needed. Patient not taking: Reported on 08/01/2015 04/17/14   Donnetta Hutching, MD  BP 135/88 mmHg  Pulse 71  Temp(Src) 97.7 F (36.5 C) (Oral)  Resp 18  SpO2 98% Physical Exam  Constitutional: He appears well-developed and well-nourished. No distress.  HENT:  Head: Normocephalic and atraumatic.  Mouth/Throat: Oropharynx is clear and moist. No oropharyngeal exudate.  no facial tenderness, deformity, malocclusion or hemotympanum.  no battle's sign or racoon eyes. OP clear  Eyes: Conjunctivae and EOM are normal. Pupils are equal, round, and reactive to light. Right eye exhibits no discharge. Left eye exhibits no discharge. No  scleral icterus.  Neck: Normal range of motion. Neck supple. No JVD present. No thyromegaly present.  Cardiovascular: Normal rate, regular rhythm, normal heart sounds and intact distal pulses.  Exam reveals no gallop and no friction rub.   No murmur heard. Pulmonary/Chest: Effort normal and breath sounds normal. No respiratory distress. He has no wheezes. He has no rales.  Tenderness over the right lateral ribs. No crepitus or subcutaneous emphysema.  Abdominal: Soft. Bowel sounds are normal. He exhibits no distension and no mass. There is no tenderness.  No tenderness of RUQ. Bruising over the right flank that is deep purple in color.  Musculoskeletal: Normal range of motion. He exhibits no edema or tenderness.  Soft compartments and supple joints diffusely  Lymphadenopathy:    He has no cervical adenopathy.  Neurological: He is alert. Coordination normal.  Skin: Skin is warm and dry. No rash noted. No erythema.  Psychiatric: He has a normal mood and affect. His behavior is normal.  Nursing note and vitals reviewed.   ED Course  Procedures   DIAGNOSTIC STUDIES: Oxygen Saturation is 97% on RA, normal by my interpretation.    COORDINATION OF CARE: 12:35 AM Will order labs, thiamine, IV fluids, and CT scans of abdomen and head. Discussed treatment plan with pt at bedside and pt agreed to plan.   Labs Review Labs Reviewed  COMPREHENSIVE METABOLIC PANEL - Abnormal; Notable for the following:    Sodium 129 (*)    Chloride 95 (*)    BUN <5 (*)    Calcium 8.8 (*)    ALT 11 (*)    All other components within normal limits  CBC WITH DIFFERENTIAL/PLATELET - Abnormal; Notable for the following:    WBC 3.9 (*)    MCH 34.7 (*)    All other components within normal limits  URINALYSIS, ROUTINE W REFLEX MICROSCOPIC (NOT AT [facility]) - Abnormal; Notable for the following:    Leukocytes, UA SMALL (*)    All other components within normal limits  ETHANOL - Abnormal; Notable for the following:     Alcohol, Ethyl (B) 246 (*)    All other components within normal limits  URINE MICROSCOPIC-ADD ON - Abnormal; Notable for the following:    Squamous Epithelial / LPF 0-5 (*)    All other components within normal limits  VITAMIN B12    Imaging Review Ct Head Wo Contrast  08/01/2015  CLINICAL DATA:  Chronic falls.  Pain in the left temporal region. EXAM: CT HEAD WITHOUT CONTRAST TECHNIQUE: Contiguous axial images were obtained from the base of the skull through the vertex without intravenous contrast. COMPARISON:  03/31/2013 FINDINGS: Diffuse cerebral atrophy. Ventricular dilatation consistent with central atrophy. Low-attenuation changes in the deep white matter consistent with small vessel ischemia. No mass effect or midline shift. No abnormal extra-axial fluid collections. Gray-white matter junctions are distinct. Basal cisterns are not effaced. No evidence of acute intracranial hemorrhage. No depressed skull fractures. Mild mucosal thickening in the  paranasal sinuses. Mastoid air cells are not opacified. Old fracture deformities of the right superior orbital rim and nasal bones. Vascular calcifications. IMPRESSION: No acute intracranial abnormalities. Chronic atrophy and small vessel ischemic changes. Electronically Signed   By: Burman Nieves M.D.   On: 08/01/2015 02:25   Ct Abdomen Pelvis W Contrast  08/01/2015  CLINICAL DATA:  Generalized body aches, worse on RIGHT. History of chronic falls, alcohol abuse, hypertension. EXAM: CT ABDOMEN AND PELVIS WITH CONTRAST TECHNIQUE: Multidetector CT imaging of the abdomen and pelvis was performed using the standard protocol following bolus administration of intravenous contrast. CONTRAST:  ISOVUE-300 IOPAMIDOL (ISOVUE-300) INJECTION 61% COMPARISON:  CT abdomen and pelvis April 13, 2010 FINDINGS: Mild motion degraded examination. LUNG BASES: Mild centrilobular emphysema. Dependent atelectasis with mild paraseptal emphysema. Heart size is normal.  Mild coronary artery calcifications. No pericardial effusion. SOLID ORGANS: The liver, spleen, gallbladder, pancreas and adrenal glands are unremarkable. GASTROINTESTINAL TRACT: The stomach, small and large bowel are normal in course and caliber without inflammatory changes. The appendix is not discretely identified, however there are no inflammatory changes in the right lower quadrant. KIDNEYS/ URINARY TRACT: Kidneys are orthotopic, demonstrating symmetric enhancement. No nephrolithiasis, hydronephrosis or solid renal masses. Too small to characterize hypodensities in the kidneys bilaterally. The unopacified ureters are normal in course and caliber. Delayed imaging through the kidneys demonstrates symmetric prompt contrast excretion within the proximal urinary collecting system. Urinary bladder is very distended to the level of the umbilicus without intravesicular calculi. PERITONEUM/RETROPERITONEUM: Aortoiliac vessels are normal in course and caliber, severe calcific atherosclerosis. No lymphadenopathy by CT size criteria. Prostate size is normal. No intraperitoneal free fluid nor free air. SOFT TISSUE/OSSEOUS STRUCTURES: Non-suspicious. Osteopenia. Grade 1 L4-5 anterolisthesis. Severe lower lumbar facet arthropathy. Moderate L4-5 and L5-S1 neural foraminal narrowing. IMPRESSION: Very distended urinary bladder, recommend correlation with urinary output. No acute intra-abdominal or pelvic process, no CT findings of recent trauma on this mildly motion degraded examination. Electronically Signed   By: Awilda Metro M.D.   On: 08/01/2015 02:30   I have personally reviewed and evaluated these images and lab results as part of my medical decision-making.    MDM   Final diagnoses:  Alcohol abuse  Contusion, flank, initial encounter   I personally performed the services described in this documentation, which was scribed in my presence. The recorded information has been reviewed and is accurate.     The  patient appears well at this time, he has eaten 3 [place] sandwiches and ambulated with a walker which she states he uses at baseline. He needed no assistance, we walked with him up and down the hallway and he had no unstable gait whatsoever. His vital signs are been unremarkable, his labs show that he was intoxicated but otherwise metabolically the patient appears in, the CT scan of his abdomen and pelvis showed no signs of significant trauma, CT scan of the brain showed no signs of stroke. At this time the patient appears stable to go home, he will be given a resource list as well as a consultation for home for social work to help the patient find appropriate living. At this time the patient does have a place to go and appears stable for discharge, he expresses understanding to the indications for return.  Eber Hong, MD 08/01/15 [phone]

## 2015-08-01 NOTE — ED Notes (Signed)
Pump started leaking during bolus thiamine infusion, infusion stopped, iv checked, replaced tubing and pump and channel. Problem fixed.

## 2015-08-01 NOTE — ED Notes (Signed)
Pt ambulated 30 ft.  to restroom with assistive devise.
